# Patient Record
Sex: Female | Born: 1945 | Race: White | Hispanic: No | Marital: Married | Smoking: Current every day smoker
Health system: Southern US, Community
[De-identification: ages and names within clinical notes are randomized; demographics above are authoritative.]

## PROBLEM LIST (undated history)

## (undated) DIAGNOSIS — D509 Iron deficiency anemia, unspecified: Secondary | ICD-10-CM

## (undated) DIAGNOSIS — N183 Chronic kidney disease, stage 3 (moderate): Secondary | ICD-10-CM

## (undated) DIAGNOSIS — M47817 Spondylosis without myelopathy or radiculopathy, lumbosacral region: Secondary | ICD-10-CM

## (undated) DIAGNOSIS — R55 Syncope and collapse: Principal | ICD-10-CM

## (undated) DIAGNOSIS — Z8489 Family history of other specified conditions: Secondary | ICD-10-CM

## (undated) DIAGNOSIS — IMO0002 Reserved for concepts with insufficient information to code with codable children: Secondary | ICD-10-CM

## (undated) DIAGNOSIS — I10 Essential (primary) hypertension: Secondary | ICD-10-CM

## (undated) DIAGNOSIS — D539 Nutritional anemia, unspecified: Secondary | ICD-10-CM

## (undated) DIAGNOSIS — K740 Hepatic fibrosis: Secondary | ICD-10-CM

## (undated) DIAGNOSIS — R109 Unspecified abdominal pain: Secondary | ICD-10-CM

## (undated) DIAGNOSIS — M1990 Unspecified osteoarthritis, unspecified site: Secondary | ICD-10-CM

## (undated) DIAGNOSIS — M329 Systemic lupus erythematosus, unspecified: Secondary | ICD-10-CM

## (undated) DIAGNOSIS — L930 Discoid lupus erythematosus: Principal | ICD-10-CM

## (undated) DIAGNOSIS — M419 Scoliosis, unspecified: Secondary | ICD-10-CM

## (undated) DIAGNOSIS — M519 Unspecified thoracic, thoracolumbar and lumbosacral intervertebral disc disorder: Secondary | ICD-10-CM

## (undated) HISTORY — PX: CATARACT EXTRACTION: SUR2

## (undated) HISTORY — DX: Essential (primary) hypertension: I10

## (undated) HISTORY — DX: Unspecified abdominal pain: R10.9

## (undated) HISTORY — DX: Spondylosis without myelopathy or radiculopathy, lumbosacral region: M47.817

## (undated) HISTORY — DX: Reserved for concepts with insufficient information to code with codable children: IMO0002

## (undated) HISTORY — DX: Nutritional anemia, unspecified: D53.9

## (undated) HISTORY — DX: Discoid lupus erythematosus: L93.0

## (undated) HISTORY — DX: Systemic lupus erythematosus, unspecified: M32.9

## (undated) HISTORY — DX: Hepatic fibrosis: K74.0

## (undated) HISTORY — DX: Chronic kidney disease, stage 3 (moderate): N18.3

## (undated) HISTORY — DX: Hemochromatosis, unspecified: E83.119

## (undated) HISTORY — PX: TUBAL LIGATION: SHX77

---

## 2000-08-08 ENCOUNTER — Encounter: Admission: RE | Admit: 2000-08-08 | Discharge: 2000-08-08 | Payer: Self-pay | Admitting: Obstetrics and Gynecology

## 2000-08-08 ENCOUNTER — Encounter: Payer: Self-pay | Admitting: Obstetrics and Gynecology

## 2001-08-10 ENCOUNTER — Encounter: Admission: RE | Admit: 2001-08-10 | Discharge: 2001-08-10 | Payer: Self-pay | Admitting: Obstetrics and Gynecology

## 2001-08-10 ENCOUNTER — Encounter: Payer: Self-pay | Admitting: Obstetrics and Gynecology

## 2002-08-14 ENCOUNTER — Encounter: Admission: RE | Admit: 2002-08-14 | Discharge: 2002-08-14 | Payer: Self-pay | Admitting: Obstetrics and Gynecology

## 2002-08-14 ENCOUNTER — Encounter: Payer: Self-pay | Admitting: Obstetrics and Gynecology

## 2003-08-19 ENCOUNTER — Encounter: Admission: RE | Admit: 2003-08-19 | Discharge: 2003-08-19 | Payer: Self-pay | Admitting: Obstetrics and Gynecology

## 2003-08-19 ENCOUNTER — Encounter: Payer: Self-pay | Admitting: Obstetrics and Gynecology

## 2004-08-19 ENCOUNTER — Encounter: Admission: RE | Admit: 2004-08-19 | Discharge: 2004-08-19 | Payer: Self-pay | Admitting: Obstetrics and Gynecology

## 2004-09-24 ENCOUNTER — Encounter: Admission: RE | Admit: 2004-09-24 | Discharge: 2004-09-24 | Payer: Self-pay | Admitting: Internal Medicine

## 2005-08-25 ENCOUNTER — Encounter: Admission: RE | Admit: 2005-08-25 | Discharge: 2005-08-25 | Payer: Self-pay | Admitting: Internal Medicine

## 2005-09-06 ENCOUNTER — Encounter: Admission: RE | Admit: 2005-09-06 | Discharge: 2005-09-06 | Payer: Self-pay | Admitting: Obstetrics and Gynecology

## 2005-10-05 ENCOUNTER — Encounter: Admission: RE | Admit: 2005-10-05 | Discharge: 2005-10-05 | Payer: Self-pay | Admitting: Internal Medicine

## 2005-10-28 ENCOUNTER — Ambulatory Visit: Payer: Self-pay | Admitting: Internal Medicine

## 2006-09-07 ENCOUNTER — Encounter: Admission: RE | Admit: 2006-09-07 | Discharge: 2006-09-07 | Payer: Self-pay | Admitting: Internal Medicine

## 2007-09-11 ENCOUNTER — Encounter: Admission: RE | Admit: 2007-09-11 | Discharge: 2007-09-11 | Payer: Self-pay | Admitting: Internal Medicine

## 2008-09-11 ENCOUNTER — Encounter: Admission: RE | Admit: 2008-09-11 | Discharge: 2008-09-11 | Payer: Self-pay | Admitting: Internal Medicine

## 2009-09-12 ENCOUNTER — Encounter: Admission: RE | Admit: 2009-09-12 | Discharge: 2009-09-12 | Payer: Self-pay | Admitting: Internal Medicine

## 2010-09-03 ENCOUNTER — Encounter: Payer: Self-pay | Admitting: Gastroenterology

## 2010-09-14 ENCOUNTER — Encounter
Admission: RE | Admit: 2010-09-14 | Discharge: 2010-09-14 | Payer: Self-pay | Source: Home / Self Care | Admitting: Internal Medicine

## 2010-09-24 ENCOUNTER — Encounter (INDEPENDENT_AMBULATORY_CARE_PROVIDER_SITE_OTHER): Payer: Self-pay | Admitting: *Deleted

## 2010-09-24 ENCOUNTER — Ambulatory Visit: Payer: Self-pay | Admitting: Gastroenterology

## 2010-11-04 ENCOUNTER — Ambulatory Visit: Payer: Self-pay | Admitting: Gastroenterology

## 2010-11-04 ENCOUNTER — Ambulatory Visit (HOSPITAL_COMMUNITY)
Admission: RE | Admit: 2010-11-04 | Discharge: 2010-11-04 | Payer: Self-pay | Source: Home / Self Care | Admitting: Gastroenterology

## 2010-11-04 DIAGNOSIS — K5730 Diverticulosis of large intestine without perforation or abscess without bleeding: Secondary | ICD-10-CM | POA: Insufficient documentation

## 2010-12-20 ENCOUNTER — Encounter: Payer: Self-pay | Admitting: Internal Medicine

## 2010-12-29 NOTE — Letter (Signed)
Summary: [facility] Instructions  [facility]  [address] [place], [place] [address]   Phone: [phone]  Fax: [phone]       Catherine Harrington    06/05/1946    MRN: [id number]        Procedure Day Dorna Bloom: Wednesday 11/04/2010     Arrival Time: 10:00 am      Procedure Time: 11:00 am     Location of Procedure:                    _x _   [facility] Center (4th Floor)                        PREPARATION FOR COLONOSCOPY WITH MOVIPREP   Starting 5 days prior to your procedure Wednesday 12/2 do not eat nuts, seeds, popcorn, corn, beans, peas,  salads, or any raw vegetables.  Do not take any fiber supplements (e.g. Metamucil, Citrucel, and Benefiber).  THE DAY BEFORE YOUR PROCEDURE         DATE: Tuesday 12/6  1.  Drink clear liquids the entire day-NO SOLID FOOD  2.  Do not drink anything colored red or purple.  Avoid juices with pulp.  No orange juice.  3.  Drink at least 64 oz. (8 glasses) of fluid/clear liquids during the day to prevent dehydration and help the prep work efficiently.  CLEAR LIQUIDS INCLUDE: Water Jello Ice Popsicles Tea (sugar ok, no milk/cream) Powdered fruit flavored drinks Coffee (sugar ok, no milk/cream) Gatorade Juice: apple, white grape, white cranberry  Lemonade Clear bullion, consomm, broth Carbonated beverages (any kind) Strained chicken noodle soup Hard Candy                             4.  In the morning, mix first dose of MoviPrep solution:    Empty 1 Pouch A and 1 Pouch B into the disposable container    Add lukewarm drinking water to the top line of the container. Mix to dissolve    Refrigerate (mixed solution should be used within 24 hrs)  5.  Begin drinking the prep at 5:00 p.m. The MoviPrep container is divided by 4 marks.   Every 15 minutes drink the solution down to the next mark (approximately 8 oz) until the full liter is complete.   6.  Follow completed prep with 16 oz of clear liquid of your choice  (Nothing red or purple).  Continue to drink clear liquids until bedtime.  7.  Before going to bed, mix second dose of MoviPrep solution:    Empty 1 Pouch A and 1 Pouch B into the disposable container    Add lukewarm drinking water to the top line of the container. Mix to dissolve    Refrigerate  THE DAY OF YOUR PROCEDURE      DATE: Wednesday 12/7  Beginning at 6:00 a.m. (5 hours before procedure):         1. Every 15 minutes, drink the solution down to the next mark (approx 8 oz) until the full liter is complete.  2. Follow completed prep with 16 oz. of clear liquid of your choice.    3. You may drink clear liquids until 9:00 am (2 HOURS BEFORE PROCEDURE).   MEDICATION INSTRUCTIONS  Unless otherwise instructed, you should take regular prescription medications with a small sip of water   as early as possible the morning of your  procedure.   Additional medication instructions: Stop taking Iron 5 days before procedure.         OTHER INSTRUCTIONS  You will need a responsible adult at least 66 years of age to accompany you and drive you home.   This person must remain in the waiting room during your procedure.  Wear loose fitting clothing that is easily removed.  Leave jewelry and other valuables at home.  However, you may wish to bring a book to read or  an iPod/MP3 player to listen to music as you wait for your procedure to start.  Remove all body piercing jewelry and leave at home.  Total time from sign-in until discharge is approximately 2-3 hours.  You should go home directly after your procedure and rest.  You can resume normal activities the  day after your procedure.  The day of your procedure you should not:   Drive   Make legal decisions   Operate machinery   Drink alcohol   Return to work  You will receive specific instructions about eating, activities and medications before you leave.    The above instructions have been reviewed and explained  to me by   Ezra Sites RN  September 24, 2010 4:05 PM     I fully understand and can verbalize these instructions _____________________________ Date _________

## 2010-12-29 NOTE — Letter (Signed)
Summary: Pre Visit Letter Revised  [facility]  [address] [place], [place] [address]   Phone: [phone]  Fax: [phone]        09/03/2010 MRN: [id number] [facility] [address] [place], [place]  [address]             Procedure Date: 11-10 at 1:30pm  Welcome to the Gastroenterology Division at [facility].    You are scheduled to see a nurse for your pre-procedure visit on 09-24-10 at 3:30pm on the 3rd floor at [facility], [address]  We ask that you try to arrive at our office 15 minutes prior to your appointment time to allow for check-in.  Please take a minute to review the attached form.  If you answer "Yes" to one or more of the questions on the first page, we ask that you call the person listed at your earliest opportunity.  If you answer "No" to all of the questions, please complete the rest of the form and bring it to your appointment.    Your nurse visit will consist of discussing your medical and surgical history, your immediate family medical history, and your medications.   If you are unable to list all of your medications on the form, please bring the medication bottles to your appointment and we will list them.  We will need to be aware of both prescribed and over the counter drugs.  We will need to know exact dosage information as well.    Please be prepared to read and sign documents such as consent forms, a financial agreement, and acknowledgement forms.  If necessary, and with your consent, a friend or relative is welcome to sit-in on the nurse visit with you.  Please bring your insurance card so that we may make a copy of it.  If your insurance requires a referral to see a specialist, please bring your referral form from your primary care physician.  No co-pay is required for this nurse visit.     If you cannot keep your appointment, please call [phone] to cancel or reschedule prior to your appointment date.  This  allows [place] the opportunity to schedule an appointment for another patient in need of care.    Thank you for choosing [facility] for your medical needs.  We appreciate the opportunity to care for you.  Please visit [place] at our website  to learn more about our practice.  Sincerely, The Gastroenterology Division

## 2010-12-29 NOTE — Miscellaneous (Signed)
Summary: [facility]  Clinical Lists Changes  Medications: Added new medication of MOVIPREP 100 GM  SOLR (PEG-KCL-NACL-NASULF-NA ASC-C) As per prep instructions. - Signed Rx of MOVIPREP 100 GM  SOLR (PEG-KCL-NACL-NASULF-NA ASC-C) As per prep instructions.;  #1 x 0;  Signed;  Entered by: Ezra Sites RN;  Authorized by: Louis Meckel MD;  Method used: [facility] [address] [phone]*, [address] [place], [place], [place]  [address], Ph: [phone] or [phone], Fax: [phone] Allergies: Added new allergy or adverse reaction of SULFA Observations: Added new observation of NKA: F (09/24/2010 15:32)    Prescriptions: MOVIPREP 100 GM  SOLR (PEG-KCL-NACL-NASULF-NA ASC-C) As per prep instructions.  #1 x 0   Entered by:   Ezra Sites RN   Authorized by:   Louis Meckel MD   Signed by:   Ezra Sites RN on 09/24/2010   Method used:   [facility] [address] [phone]* (retail)       [address]       [place], [place]  [address]       Ph: [phone] or [phone]       Fax: [phone]   RxID:   [id number]

## 2010-12-29 NOTE — Procedures (Signed)
Summary: Colonoscopy  Patient: [name] Note: All result statuses are Final unless otherwise noted.  Tests: (1) Colonoscopy (COL)   COL Colonoscopy           DONE     [facility]     [address]     [place], [place]  [address]           COLONOSCOPY PROCEDURE REPORT           PATIENT:  Catherine Harrington, Catherine Harrington  MR#:  [id number]     BIRTHDATE:  06-10-1946, 64 yrs. old  GENDER:  female     ENDOSCOPIST:  Barbette Hair. Arlyce Dice, MD     REF. BY:  Nila Nephew, M.D.     PROCEDURE DATE:  11/04/2010     PROCEDURE:  Incomplete colonoscopy     ASA CLASS:  Class II     INDICATIONS:  Routine Risk Screening     MEDICATIONS:   Fentanyl 100 mcg IV, Versed 9 mg IV, Benadryl 37.5     mg IV, glucagon 1 mg IV           DESCRIPTION OF PROCEDURE:   After the risks benefits and     alternatives of the procedure were thoroughly explained, informed     consent was obtained.  Digital rectal exam was performed and     revealed no abnormalities.   The LB 180AL [id number] endoscope was     introduced through the anus and advanced to the sigmoid colon,     limited by extreme patient discomfort, severe spasm; unable to     pass scope, stenosis.  Unable to pass scope beyond sigmoid due to     severe spasm and pt discomfort  The quality of the prep was .  The     instrument was then slowly withdrawn as the colon was fully     examined.     <<PROCEDUREIMAGES>>           FINDINGS:  Severe diverticulosis was found.  This was otherwise a     normal examination of the colon (see image1 and image2).     Retroflexed views in the rectum revealed no abnormalities.    The     scope was then withdrawn from the patient and the procedure     completed.           COMPLICATIONS:  None     ENDOSCOPIC IMPRESSION:     1) Severe diverticulosis     2) Incomplete exam     RECOMMENDATIONS:     1) My office will arrange to you to have a barium enema     performed. This is a radiology test to further examine your colon.       2) Sedation with MAC for future procedures     REPEAT EXAM:  In 10 year(s) for Colonoscopy.           ______________________________     Barbette Hair. Arlyce Dice, MD           CC:           n.     eSIGNED:   Barbette Hair. Atticus Lemberger at 11/04/2010 11:37 AM           Leanor Kail, [id number]  Note: An exclamation mark (!) indicates a result that was not dispersed into the flowsheet. Document Creation Date: 11/04/2010 11:38 AM _______________________________________________________________________  (1) Order result status: Final Collection or observation date-time: 11/04/2010 11:30 Requested date-time:  Receipt date-time:  Reported date-time:  Referring Physician:   Ordering Physician: Melvia Heaps [phone]) Specimen Source:  Source: Launa Grill Order Number: [id number] Lab site:   Appended Document: Orders Update    Clinical Lists Changes  Problems: Added new problem of DIVERTICULOSIS, COLON (ICD-562.10) Orders: Added new Test order of Barium Enema (BE) - Signed      Appended Document: Colonoscopy    Clinical Lists Changes  Observations: Added new observation of COLONNXTDUE: 10/2020 (11/04/2010 13:46)

## 2011-08-11 ENCOUNTER — Other Ambulatory Visit: Payer: Self-pay | Admitting: Internal Medicine

## 2011-08-11 DIAGNOSIS — Z1231 Encounter for screening mammogram for malignant neoplasm of breast: Secondary | ICD-10-CM

## 2011-09-15 ENCOUNTER — Encounter: Payer: Self-pay | Admitting: Gynecology

## 2011-09-15 ENCOUNTER — Ambulatory Visit (INDEPENDENT_AMBULATORY_CARE_PROVIDER_SITE_OTHER): Payer: Medicare Other | Admitting: Gynecology

## 2011-09-15 VITALS — BP 140/80 | Ht 60.25 in | Wt 109.0 lb

## 2011-09-15 DIAGNOSIS — N952 Postmenopausal atrophic vaginitis: Secondary | ICD-10-CM

## 2011-09-15 DIAGNOSIS — R82998 Other abnormal findings in urine: Secondary | ICD-10-CM

## 2011-09-15 DIAGNOSIS — Z79890 Hormone replacement therapy (postmenopausal): Secondary | ICD-10-CM

## 2011-09-15 DIAGNOSIS — Z780 Asymptomatic menopausal state: Secondary | ICD-10-CM

## 2011-09-15 DIAGNOSIS — Z13820 Encounter for screening for osteoporosis: Secondary | ICD-10-CM

## 2011-09-15 MED ORDER — MEDROXYPROGESTERONE ACETATE 2.5 MG PO TABS: 2.5000 mg | ORAL_TABLET | Freq: Every day | ORAL | Status: DC

## 2011-09-15 MED ORDER — ESTRADIOL 0.5 MG PO TABS: 0.5000 mg | ORAL_TABLET | Freq: Every day | ORAL | Status: DC

## 2011-09-15 NOTE — Progress Notes (Signed)
Addended byCammie Mcgee T on: 09/15/2011 04:10 PM   Modules accepted: Orders

## 2011-09-15 NOTE — Progress Notes (Signed)
Catherine Harrington Apr 10, 1946 [id number]        65 y.o.  Follow up.  Former patient of Dr. Angeline Slim. She's doing well from a gynecologic standpoint. She is on HRT of estradiol 1 mg Provera 5 mg daily has been on this since age 22 when she went through early menopause.  Past medical history,surgical history, medications, allergies, family history and social history were all reviewed and documented in the EPIC chart. ROS:  Was performed and pertinent positives and negatives are included in the history.  Exam: chaperone present Filed Vitals:   09/15/11 1403  BP: 140/80   General appearance  Normal Skin with erythematous patchy rash over shoulders neck upper arms. Small crusting lesion left shoulder. Head/Neck normal with no cervical or supraclavicular adenopathy thyroid normal Lungs  clear Cardiac RR, without RMG Abdominal  soft, nontender, without masses, organomegaly or hernia Breasts  examined lying and sitting without masses, retractions, discharge or axillary adenopathy. Pelvic  Ext/BUS/vagina  normal with atrophic genital changes noted  Cervix  normal  Pap not done  Uterus  anteverted, normal size, shape and contour, midline and mobile nontender   Adnexa  Without masses or tenderness    Anus and perineum  normal   Rectovaginal  normal sphincter tone without palpated masses or tenderness.    Assessment/Plan:  65 y.o. female for annual exam.    1. HRT. I reviewed the issues of HRT. The WHI study increased risk of stroke heart attack DVT increased risk of breast cancer as well as the ACOG and NAMS recommendation for lowest dose for the shortest period of time. My recommendation would be to try to wean from HRT at this point. She will finish her current prescription and I prescribed a 30 day supply of estradiol 0.5 mg and Provera 2.5 mg she'll take daily for 2 weeks and then every other to every third day for 2 weeks and then stop. She does well off of this she will follow if she has any  issues, recurrences of hot flushes night sweats or any other symptoms she should call me and we'll rediscuss HRT. 2. Cigarette smoking. I discussed the need to stop smoking and she acknowledges is my recommendation and requests no help at this time. 3. Bone density. She reports having a bone density a number of years ago but no recent study. She has multiple risk factors.  I ordered a bone density now,  She had blood work done through her primary office and I asked her to call to see if they did a vitamin D and if not then to have one done the next time they draw blood. 4. Skin lesions. Patient has a generalized erythematous patchy rash over her shoulders neck upper arms. Saw Dr. Danella Deis, is unhappy with recommendations. She has isolated scaly lesion on her upper left shoulder present for several weeks and I discussed with her the possibility of skin cancer and I recommended she follow up with a dermatologist to examine her and possibly excise or biopsy. I gave him the name of other dermatologists in town and she agrees to call make an appointment to see them and the importance of follow up. 5. Health maintenance. Self breast exams on a monthly basis discussed encouraged. She has mammogram scheduled next week we'll follow up for this. She had colposcopy a year ago historically and will follow up with their recommendations.    Dara Lords MD, 3:37 PM 09/15/2011

## 2011-09-20 ENCOUNTER — Ambulatory Visit: Payer: Self-pay

## 2011-09-23 ENCOUNTER — Ambulatory Visit
Admission: RE | Admit: 2011-09-23 | Discharge: 2011-09-23 | Disposition: A | Discharge status: Home / Self Care | Payer: BLUE CROSS/BLUE SHIELD | Source: Ambulatory Visit | Attending: Internal Medicine | Admitting: Internal Medicine

## 2011-09-23 DIAGNOSIS — Z1231 Encounter for screening mammogram for malignant neoplasm of breast: Secondary | ICD-10-CM

## 2011-10-19 ENCOUNTER — Ambulatory Visit (INDEPENDENT_AMBULATORY_CARE_PROVIDER_SITE_OTHER): Payer: Medicare Other

## 2011-10-19 DIAGNOSIS — Z13820 Encounter for screening for osteoporosis: Secondary | ICD-10-CM

## 2011-11-04 ENCOUNTER — Telehealth: Payer: Self-pay | Admitting: *Deleted

## 2011-11-04 NOTE — Telephone Encounter (Signed)
Pt had question about her bone density report copy that was mailed regarding a comment that TF wrote. All questions answered.

## 2011-11-15 ENCOUNTER — Telehealth: Payer: Self-pay | Admitting: Gynecology

## 2011-11-15 NOTE — Telephone Encounter (Signed)
Spoke with pt regarding the below note, pt wanted the name of other orthopedic office gave p [facility], she will have records sent. Pt complained she didn't like her previous doctor.

## 2011-11-15 NOTE — Telephone Encounter (Signed)
Lm for pt to call

## 2011-11-15 NOTE — Telephone Encounter (Signed)
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 I would recommend that she follow up with an orthopedic surgeon for evaluation and possible further studies such as diagnostic x-rays or even MRI.

## 2011-11-30 HISTORY — PX: MOUTH SURGERY: SHX715

## 2011-12-15 ENCOUNTER — Encounter: Payer: Self-pay | Admitting: *Deleted

## 2011-12-15 NOTE — Progress Notes (Signed)
Patient ID: Catherine Harrington, female   DOB: [birth year], 66 y.o.   MRN: [id number] Pt wanted to know if she TF wanted her to continue provera 2.5 mg. I told pt per last office visit he wanted her to wean off medication and if no problems than no medicine was needed.

## 2012-05-10 ENCOUNTER — Other Ambulatory Visit: Payer: Self-pay | Admitting: Gynecology

## 2012-05-10 ENCOUNTER — Ambulatory Visit (INDEPENDENT_AMBULATORY_CARE_PROVIDER_SITE_OTHER): Payer: Medicare Other | Admitting: Gynecology

## 2012-05-10 ENCOUNTER — Encounter: Payer: Self-pay | Admitting: Gynecology

## 2012-05-10 DIAGNOSIS — N390 Urinary tract infection, site not specified: Secondary | ICD-10-CM

## 2012-05-10 DIAGNOSIS — R350 Frequency of micturition: Secondary | ICD-10-CM

## 2012-05-10 DIAGNOSIS — IMO0001 Reserved for inherently not codable concepts without codable children: Secondary | ICD-10-CM

## 2012-05-10 LAB — URINALYSIS W MICROSCOPIC + REFLEX CULTURE
Casts: NONE SEEN
Crystals: NONE SEEN
Ketones, ur: NEGATIVE mg/dL
Nitrite: POSITIVE — AB
Specific Gravity, Urine: 1.01 (ref 1.005–1.030)
pH: 5.5 (ref 5.0–8.0)

## 2012-05-10 MED ORDER — PHENAZOPYRIDINE HCL 200 MG PO TABS: 200.0000 mg | ORAL_TABLET | Freq: Three times a day (TID) | ORAL | Status: AC | PRN

## 2012-05-10 MED ORDER — CIPROFLOXACIN HCL 500 MG PO TABS: 500.0000 mg | ORAL_TABLET | Freq: Two times a day (BID) | ORAL | Status: AC

## 2012-05-10 NOTE — Patient Instructions (Signed)
Urinary Tract Infection Infections of the urinary tract can start in several places. A bladder infection (cystitis), a kidney infection (pyelonephritis), and a prostate infection (prostatitis) are different types of urinary tract infections (UTIs). They usually get better if treated with medicines (antibiotics) that kill germs. Take all the medicine until it is gone. You or your child may feel better in a few days, but TAKE ALL MEDICINE or the infection may not respond and may become more difficult to treat. HOME CARE INSTRUCTIONS   Drink enough water and fluids to keep the urine clear or pale yellow. Cranberry juice is especially recommended, in addition to large amounts of water.   Avoid caffeine, tea, and carbonated beverages. They tend to irritate the bladder.   Alcohol may irritate the prostate.   Only take over-the-counter or prescription medicines for pain, discomfort, or fever as directed by your caregiver.  To prevent further infections:  Empty the bladder often. Avoid holding urine for long periods of time.   After a bowel movement, women should cleanse from front to back. Use each tissue only once.   Empty the bladder before and after sexual intercourse.  FINDING OUT THE RESULTS OF YOUR TEST Not all test results are available during your visit. If your or your child's test results are not back during the visit, make an appointment with your caregiver to find out the results. Do not assume everything is normal if you have not heard from your caregiver or the medical facility. It is important for you to follow up on all test results. SEEK MEDICAL CARE IF:   There is back pain.   Your baby is older than 3 months with a rectal temperature of 100.5 F (38.1 C) or higher for more than 1 day.   Your or your child's problems (symptoms) are no better in 3 days. Return sooner if you or your child is getting worse.  SEEK IMMEDIATE MEDICAL CARE IF:   There is severe back pain or lower  abdominal pain.   You or your child develops chills.   You have a fever.   Your baby is older than 3 months with a rectal temperature of 102 F (38.9 C) or higher.   Your baby is 78 months old or younger with a rectal temperature of 100.4 F (38 C) or higher.   There is nausea or vomiting.   There is continued burning or discomfort with urination.  MAKE SURE YOU:   Understand these instructions.   Will watch your condition.   Will get help right away if you are not doing well or get worse.  Document Released: 08/25/2005 Document Revised: 11/04/2011 Document Reviewed: 03/30/2007 [facility] Patient Information 2012 [place], [place].Take antibiotics as prescribed.

## 2012-05-10 NOTE — Progress Notes (Signed)
Patient presents with two-day history of cloudy urine suprapubic pressure symptoms and mild low back pain. No fever chills nausea vomiting diarrhea constipation. Is having more hot flashes since she stopped her HRT but is tolerable to her.  Exam Spine straight without CVA tenderness. Abdomen soft mild suprapubic tenderness no masses guarding rebound organomegaly.  Assessment and plan: Symptoms and UA consistent with UTI. We'll treat with ciprofloxacin 500 twice a day x3 days and Pyridium 200 mg 3 times a day x3 days. Follow up if symptoms persist or recur. Follow up if she wants to rediscuss HRT.

## 2012-08-15 ENCOUNTER — Other Ambulatory Visit: Payer: Self-pay | Admitting: Internal Medicine

## 2012-08-15 DIAGNOSIS — Z1231 Encounter for screening mammogram for malignant neoplasm of breast: Secondary | ICD-10-CM

## 2012-09-15 ENCOUNTER — Encounter: Payer: Self-pay | Admitting: Gynecology

## 2012-09-15 ENCOUNTER — Ambulatory Visit (INDEPENDENT_AMBULATORY_CARE_PROVIDER_SITE_OTHER): Payer: Medicare Other | Admitting: Gynecology

## 2012-09-15 VITALS — BP 126/78 | Ht 61.0 in | Wt 109.0 lb

## 2012-09-15 DIAGNOSIS — N951 Menopausal and female climacteric states: Secondary | ICD-10-CM

## 2012-09-15 DIAGNOSIS — N952 Postmenopausal atrophic vaginitis: Secondary | ICD-10-CM

## 2012-09-15 NOTE — Progress Notes (Signed)
Catherine Harrington 06/04/1946 [id number]        66 y.o.  [id number] for follow up exam.    Past medical history,surgical history, medications, allergies, family history and social history were all reviewed and documented in the EPIC chart. ROS:  Was performed and pertinent positives and negatives are included in the history.  Exam: Kim assistant Filed Vitals:   09/15/12 1406  BP: 126/78  Height: 5\' 1"  (1.549 m)  Weight: 109 lb (49.442 kg)   General appearance  Normal Skin mottled appearance upper chest and back consistent with old skin disease. Head/Neck normal with no cervical or supraclavicular adenopathy thyroid normal Lungs  clear Cardiac RR, without RMG Abdominal  soft, nontender, without masses, organomegaly or hernia Breasts  examined lying and sitting without masses, retractions, discharge or axillary adenopathy. Pelvic  Ext/BUS/vagina  normal with mild atrophic changes  Cervix  normal with atrophic changes  Uterus  axial, normal size, shape and contour, midline and mobile nontender   Adnexa  Without masses or tenderness    Anus and perineum  normal   Rectovaginal  normal sphincter tone without palpated masses or tenderness.    Assessment/Plan:  66 y.o. [id number] female for follow up exam.   1. Menopausal symptoms. Patient continues with hot flashes/night sweats. She discontinued HRT last year after our discussion.  Options for management include observation, OTC soy based, pharmacologic nonhormonal such as Effexor and HRT reviewed. She wants to try soy-based first and will follow up if she wants to discuss other alternatives. 2. Atrophic vaginal changes. Patient asymptomatic. We'll plan on following. 3. History of skin rash. Actively being followed by dermatology. 4. Mammography. Patient is due now she knows to schedule this. SBE monthly reviewed. 5. DEXA 2012 normal. Plan repeat at 5 year interval.  Increase calcium vitamin D reviewed. 6. Pap smear. No Pap smear done today. No  history of abnormal Pap smears before. We've previously discussed and she is over the age of 19 and we will plan stop screening. 7. Colonoscopy.  Patient had 2011. We'll follow up with their recommended interval. 8. Health maintenance. No blood work done today as this all done through her primary physician's office. Follow up one year, sooner as needed.   Dara Lords MD, 3:19 PM 09/15/2012

## 2012-09-15 NOTE — Patient Instructions (Signed)
Follow up in one year for annual exam 

## 2012-09-25 ENCOUNTER — Ambulatory Visit
Admission: RE | Admit: 2012-09-25 | Discharge: 2012-09-25 | Disposition: A | Discharge status: Home / Self Care | Payer: Medicare Other | Source: Ambulatory Visit | Attending: Internal Medicine | Admitting: Internal Medicine

## 2012-09-25 DIAGNOSIS — Z1231 Encounter for screening mammogram for malignant neoplasm of breast: Secondary | ICD-10-CM

## 2013-01-13 ENCOUNTER — Other Ambulatory Visit: Payer: Self-pay

## 2013-07-04 ENCOUNTER — Other Ambulatory Visit: Payer: Self-pay

## 2013-08-16 ENCOUNTER — Encounter (HOSPITAL_COMMUNITY): Payer: Self-pay | Admitting: General Practice

## 2013-08-16 ENCOUNTER — Observation Stay (HOSPITAL_COMMUNITY)
Admission: EM | Admit: 2013-08-16 | Discharge: 2013-08-17 | Disposition: A | Discharge status: Home / Self Care | Payer: Medicare Other | Attending: Internal Medicine | Admitting: Internal Medicine

## 2013-08-16 ENCOUNTER — Emergency Department (HOSPITAL_COMMUNITY): Payer: Medicare Other

## 2013-08-16 ENCOUNTER — Observation Stay (HOSPITAL_COMMUNITY): Payer: Medicare Other

## 2013-08-16 DIAGNOSIS — I959 Hypotension, unspecified: Secondary | ICD-10-CM | POA: Insufficient documentation

## 2013-08-16 DIAGNOSIS — K5730 Diverticulosis of large intestine without perforation or abscess without bleeding: Secondary | ICD-10-CM

## 2013-08-16 DIAGNOSIS — I079 Rheumatic tricuspid valve disease, unspecified: Secondary | ICD-10-CM | POA: Insufficient documentation

## 2013-08-16 DIAGNOSIS — R55 Syncope and collapse: Principal | ICD-10-CM

## 2013-08-16 DIAGNOSIS — I059 Rheumatic mitral valve disease, unspecified: Secondary | ICD-10-CM | POA: Insufficient documentation

## 2013-08-16 DIAGNOSIS — D7589 Other specified diseases of blood and blood-forming organs: Secondary | ICD-10-CM

## 2013-08-16 DIAGNOSIS — R780 Finding of alcohol in blood: Secondary | ICD-10-CM | POA: Insufficient documentation

## 2013-08-16 DIAGNOSIS — I6529 Occlusion and stenosis of unspecified carotid artery: Secondary | ICD-10-CM | POA: Insufficient documentation

## 2013-08-16 HISTORY — DX: Iron deficiency anemia, unspecified: D50.9

## 2013-08-16 HISTORY — DX: Syncope and collapse: R55

## 2013-08-16 HISTORY — DX: Unspecified osteoarthritis, unspecified site: M19.90

## 2013-08-16 HISTORY — DX: Unspecified thoracic, thoracolumbar and lumbosacral intervertebral disc disorder: M51.9

## 2013-08-16 HISTORY — DX: Scoliosis, unspecified: M41.9

## 2013-08-16 HISTORY — DX: Family history of other specified conditions: Z84.89

## 2013-08-16 LAB — CBC WITH DIFFERENTIAL/PLATELET
Basophils Absolute: 0 10*3/uL (ref 0.0–0.1)
Basophils Relative: 0 % (ref 0–1)
Eosinophils Absolute: 0 10*3/uL (ref 0.0–0.7)
Eosinophils Relative: 1 % (ref 0–5)
Lymphs Abs: 1.3 10*3/uL (ref 0.7–4.0)
MCH: 36.5 pg — ABNORMAL HIGH (ref 26.0–34.0)
MCHC: 35.9 g/dL (ref 30.0–36.0)
MCV: 101.7 fL — ABNORMAL HIGH (ref 78.0–100.0)
Platelets: 191 10*3/uL (ref 150–400)
RDW: 12.4 % (ref 11.5–15.5)

## 2013-08-16 LAB — ETHANOL: Alcohol, Ethyl (B): 52 mg/dL — ABNORMAL HIGH (ref 0–11)

## 2013-08-16 LAB — CBC
HCT: 34 % — ABNORMAL LOW (ref 36.0–46.0)
Hemoglobin: 12 g/dL (ref 12.0–15.0)
MCH: 36.3 pg — ABNORMAL HIGH (ref 26.0–34.0)
MCV: 102.7 fL — ABNORMAL HIGH (ref 78.0–100.0)
RBC: 3.31 MIL/uL — ABNORMAL LOW (ref 3.87–5.11)

## 2013-08-16 LAB — URINALYSIS, ROUTINE W REFLEX MICROSCOPIC
Bilirubin Urine: NEGATIVE
Leukocytes, UA: NEGATIVE
Nitrite: NEGATIVE
Specific Gravity, Urine: 1.003 — ABNORMAL LOW (ref 1.005–1.030)
pH: 5.5 (ref 5.0–8.0)

## 2013-08-16 LAB — POCT I-STAT TROPONIN I: Troponin i, poc: 0 ng/mL (ref 0.00–0.08)

## 2013-08-16 LAB — COMPREHENSIVE METABOLIC PANEL
ALT: 67 U/L — ABNORMAL HIGH (ref 0–35)
Calcium: 10 mg/dL (ref 8.4–10.5)
GFR calc Af Amer: 71 mL/min — ABNORMAL LOW (ref >90)
Glucose, Bld: 97 mg/dL (ref 70–99)
Sodium: 134 mEq/L — ABNORMAL LOW (ref 135–145)
Total Protein: 7.2 g/dL (ref 6.0–8.3)

## 2013-08-16 LAB — TSH: TSH: 3.16 u[IU]/mL (ref 0.350–4.500)

## 2013-08-16 LAB — VITAMIN B12: Vitamin B-12: 770 pg/mL (ref 211–911)

## 2013-08-16 MED ORDER — LOSARTAN POTASSIUM 50 MG PO TABS: 100.0000 mg | ORAL_TABLET | Freq: Every day | ORAL | Status: DC

## 2013-08-16 MED ORDER — ACETAMINOPHEN 325 MG PO TABS: 650.0000 mg | ORAL_TABLET | Freq: Four times a day (QID) | ORAL | Status: DC | PRN

## 2013-08-16 MED ORDER — LOSARTAN POTASSIUM 50 MG PO TABS
100.0000 mg | ORAL_TABLET | Freq: Every day | ORAL | Status: DC
  Filled 2013-08-16: qty 2

## 2013-08-16 MED ORDER — FERROUS SULFATE 325 (65 FE) MG PO TABS
325.0000 mg | ORAL_TABLET | Freq: Every day | ORAL | Status: DC
  Administered 2013-08-17: 325 mg via ORAL
  Filled 2013-08-16 (×2): qty 1

## 2013-08-16 MED ORDER — SODIUM CHLORIDE 0.9 % IV BOLUS (SEPSIS)
1000.0000 mL | Freq: Once | INTRAVENOUS | Status: AC
  Administered 2013-08-16: 1000 mL via INTRAVENOUS

## 2013-08-16 MED ORDER — SODIUM CHLORIDE 0.9 % IJ SOLN: 3.0000 mL | Freq: Two times a day (BID) | INTRAMUSCULAR | Status: DC

## 2013-08-16 MED ORDER — HEPARIN SODIUM (PORCINE) 5000 UNIT/ML IJ SOLN
5000.0000 [IU] | Freq: Three times a day (TID) | INTRAMUSCULAR | Status: DC
  Filled 2013-08-16 (×6): qty 1

## 2013-08-16 MED ORDER — LOSARTAN POTASSIUM 50 MG PO TABS
100.0000 mg | ORAL_TABLET | Freq: Every day | ORAL | Status: DC
  Administered 2013-08-16: 100 mg via ORAL
  Filled 2013-08-16 (×2): qty 2

## 2013-08-16 MED ORDER — ONDANSETRON HCL 4 MG/2ML IJ SOLN: 4.0000 mg | Freq: Four times a day (QID) | INTRAMUSCULAR | Status: DC | PRN

## 2013-08-16 MED ORDER — ACETAMINOPHEN 650 MG RE SUPP: 650.0000 mg | Freq: Four times a day (QID) | RECTAL | Status: DC | PRN

## 2013-08-16 MED ORDER — CALCIUM CARBONATE 600 MG PO TABS: 600.0000 mg | ORAL_TABLET | Freq: Two times a day (BID) | ORAL | Status: DC

## 2013-08-16 MED ORDER — ONDANSETRON HCL 4 MG PO TABS: 4.0000 mg | ORAL_TABLET | Freq: Four times a day (QID) | ORAL | Status: DC | PRN

## 2013-08-16 MED ORDER — THIAMINE HCL 100 MG/ML IJ SOLN
Freq: Once | INTRAVENOUS | Status: AC
  Administered 2013-08-16: 20:00:00 via INTRAVENOUS
  Filled 2013-08-16: qty 1000

## 2013-08-16 MED ORDER — DOXAZOSIN MESYLATE 8 MG PO TABS
8.0000 mg | ORAL_TABLET | Freq: Every day | ORAL | Status: DC
  Administered 2013-08-16: 8 mg via ORAL
  Filled 2013-08-16 (×2): qty 1

## 2013-08-16 MED ORDER — CALCIUM CARBONATE 1250 (500 CA) MG PO TABS
1.0000 | ORAL_TABLET | Freq: Two times a day (BID) | ORAL | Status: DC
  Administered 2013-08-16 – 2013-08-17 (×2): 500 mg via ORAL
  Filled 2013-08-16 (×4): qty 1

## 2013-08-16 MED ORDER — MORPHINE SULFATE 2 MG/ML IJ SOLN: 2.0000 mg | INTRAMUSCULAR | Status: DC | PRN

## 2013-08-16 NOTE — ED Notes (Signed)
Pt. States "I feel fine now, I'm ready to go home".

## 2013-08-16 NOTE — ED Provider Notes (Signed)
CSN: [id number]     Arrival date & time 08/16/13  0507 History   First MD Initiated Contact with Patient 08/16/13 [phone]     Chief Complaint  Patient presents with  . 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   She is BIB EMS after a syncopal event. The patient had gotten up in the middle of the night to urinate and, as she was leaving the bathroom, her husband heard her fall. He came to her side and she seemed slightly dazed but was alert and completely oriented within about 2-3 seconds. She was not incontinent. Denies any preceding lightheadedness, cp or sob. NO recent med changes. PO intake has been normal.   Patient had a single similar episode of post micturition syncope about 10 years ago. She was worked up in the office of her PCP.   Past Medical History  Diagnosis Date  . Hypertension   . Lupus    Past Surgical History  Procedure Laterality Date  . Cesarean section      x2  . Mouth surgery    . Tubal ligation     Family History  Problem Relation Age of Onset  . Hypertension Mother   . Hypertension Father    History  Substance Use Topics  . Smoking status: Current Every Day Smoker -- 1.00 packs/day    Types: Cigarettes  . Smokeless tobacco: Never Used  . Alcohol Use: 7.5 oz/week    15 drink(s) per week     Comment: 2-3 beers at night    OB History   Grav Para Term Preterm Abortions TAB SAB Ect Mult Living   4 4 2       2      Review of Systems 10 point ROS negative except for sx noted above.  Allergies  Midol and Sulfonamide derivatives  Home Medications   Current Outpatient Rx  Name  Route  Sig  Dispense  Refill  . calcium carbonate (OS-CAL) 600 MG TABS   Oral   Take 600 mg by mouth 2 (two) times daily with a meal.           . doxazosin (CARDURA) 8 MG  tablet   Oral   Take 8 mg by mouth at bedtime.           . ferrous sulfate 325 (65 FE) MG tablet   Oral   Take 325 mg by mouth daily with breakfast.           . fexofenadine (ALLEGRA) 180 MG tablet   Oral   Take 180 mg by mouth daily.           . fish oil-omega-3 fatty acids 1000 MG capsule   Oral   Take 2 g by mouth daily.           . Glucosamine HCl (GLUCOSAMINE PO)   Oral   Take 1 tablet by mouth daily.         Marland Kitchen losartan (COZAAR) 100 MG tablet   Oral   Take 100 mg by mouth daily.           . Multiple Vitamin (MULTIVITAMIN) tablet   Oral   Take 1 tablet by mouth daily.           Marland Kitchen OVER THE COUNTER MEDICATION   Oral   Take 1 tablet by mouth daily.  Tumeric          BP 111/56  Pulse 76  Temp(Src) 98 F (36.7 C) (Oral)  Resp 20  SpO2 98% Physical Exam Gen: well developed and well nourished appearing Head: NCAT, no scalp hematoma or signs of trauma Eyes: PERL, EOMI Nose: no epistaixis or rhinorrhea Mouth/throat: mucosa is mildly dehydrated appearing and pink Neck: supple, no stridor, no c spine ttp Lungs: CTA B, no wheezing, rhonchi or rales CV: RRR, no murmur, ext appear well perfused.  Abd: soft, notender, nondistended Back: no ttp, normal to inspection.  Skin: warm and dry Neuro: CN ii-xii grossly intact, no focal deficits Ext: nontender Psyche; normal affect,  calm and cooperative.   ED Course  Procedures (including critical care time)  Results for orders placed during the hospital encounter of 08/16/13 (from the past 24 hour(s))  GLUCOSE, CAPILLARY     Status: None   Collection Time    08/16/13  6:13 AM      Result Value Range   Glucose-Capillary 90  70 - 99 mg/dL  CBC WITH DIFFERENTIAL     Status: Abnormal   Collection Time    08/16/13  6:20 AM      Result Value Range   WBC 5.1  4.0 - 10.5 K/uL   RBC 3.48 (*) 3.87 - 5.11 MIL/uL   Hemoglobin 12.7  12.0 - 15.0 g/dL   HCT 16.1 (*) 09.6 - 04.5 %   MCV 101.7 (*) 78.0 - 100.0 fL    MCH 36.5 (*) 26.0 - 34.0 pg   MCHC 35.9  30.0 - 36.0 g/dL   RDW 40.9  81.1 - 91.4 %   Platelets 191  150 - 400 K/uL   Neutrophils Relative % 67  43 - 77 %   Neutro Abs 3.4  1.7 - 7.7 K/uL   Lymphocytes Relative 25  12 - 46 %   Lymphs Abs 1.3  0.7 - 4.0 K/uL   Monocytes Relative 7  3 - 12 %   Monocytes Absolute 0.3  0.1 - 1.0 K/uL   Eosinophils Relative 1  0 - 5 %   Eosinophils Absolute 0.0  0.0 - 0.7 K/uL   Basophils Relative 0  0 - 1 %   Basophils Absolute 0.0  0.0 - 0.1 K/uL  COMPREHENSIVE METABOLIC PANEL     Status: Abnormal   Collection Time    08/16/13  6:20 AM      Result Value Range   Sodium 134 (*) 135 - 145 mEq/L   Potassium 4.0  3.5 - 5.1 mEq/L   Chloride 101  96 - 112 mEq/L   CO2 19  19 - 32 mEq/L   Glucose, Bld 97  70 - 99 mg/dL   BUN 12  6 - 23 mg/dL   Creatinine, Ser 7.82  0.50 - 1.10 mg/dL   Calcium 95.6  8.4 - 21.3 mg/dL   Total Protein 7.2  6.0 - 8.3 g/dL   Albumin 3.6  3.5 - 5.2 g/dL   AST 86 (*) 0 - 37 U/L   ALT 67 (*) 0 - 35 U/L   Alkaline Phosphatase 85  39 - 117 U/L   Total Bilirubin 0.3  0.3 - 1.2 mg/dL   GFR calc non Af Amer 61 (*) >90 mL/min   GFR calc Af Amer 71 (*) >90 mL/min   CXR: normal cardiac silloute, normal appearing mediastinum, no infiltrates, no acute process identified.   EKG: nsr, no acute ischemic changes, normal intervals,  normal axis, normal qrs complex MDM   I received additional history from the patient's husband that her BP was 80s/40s at home, per EMS. Nurse who received report indicated in her charting that initial BP was even lower with systolic in the 60s.   Suspect post-micturition syncope. However, it is unusual that the patient's BP would not have normalized by the time that EMS arrived. Even more puzzling is the fact that her orthostatic VS are wnl. No lab, CXR or EKG abnormalities. I believe the patient would benefit from observation admission and cardiac monitoring along with full rule out and consideration of outpatient  heart monitoring.   6213: I have discussed ED findings with the patient and given her my recommendation for admission. We have had an extensive discussion - > 10 minutes.   Brandt Loosen, MD 08/30/13 [phone]

## 2013-08-16 NOTE — ED Notes (Signed)
Per ems-- pt from home, pt got up from bed. Pt husband found pt on ground, pt didn't remember how she got there. Upon ems arrival pt states she doesn't feel bad. First bp 60/20 (automatic). IV L wrist. Last bp 84/56 hr. 60's.

## 2013-08-16 NOTE — Progress Notes (Signed)
Utilization review completed.  

## 2013-08-16 NOTE — ED Notes (Signed)
PT HUSBAND-TONY [phone]

## 2013-08-16 NOTE — ED Notes (Signed)
Pt PCP is Dr Joesphine Bare Wilson.

## 2013-08-16 NOTE — H&P (Addendum)
[facility] History and Physical  Catherine Harrington ZOX:[id number] DOB: 1946/02/28 DOA: 08/16/2013  Referring physician: Emergency Department PCP: Enrique Sack, MD  Specialists:   Chief Complaint: Syncope  HPI: Catherine Harrington is a 67 y.o. female  With a hx of lupus who presented to the ED s/p acute syncope with no pre-syncopal symptoms. Recalled up to the toilet this AM to void when she spontaneously had LOC. Awoke without postictal state. No bowel/bladder incontinence. No CP or SOB. In the ED, pt noted to have elevated ETOH level at 52. Head CT has since been ordered, still pending.  Review of Systems:  Per above, the remainder of 10pt ROS reviewed and are neg  Past Medical History  Diagnosis Date  . Hypertension   . Lupus    Past Surgical History  Procedure Laterality Date  . Cesarean section      x2  . Mouth surgery    . Tubal ligation     Social History:  reports that she has been smoking Cigarettes.  She has been smoking about 1.00 pack per day. She has never used smokeless tobacco. She reports that she drinks about 7.5 ounces of alcohol per week. She reports that she does not use illicit drugs.  where does patient live--home, ALF, SNF? and with whom if at home?  Can patient participate in ADLs?  Allergies  Allergen Reactions  . Midol [Ibuprofen]   . Sulfonamide Derivatives     REACTION: throat swells    Family History  Problem Relation Age of Onset  . Hypertension Mother   . Hypertension Father     (be sure to complete)  Prior to Admission medications   Medication Sig Start Date End Date Taking? Authorizing Provider  calcium carbonate (OS-CAL) 600 MG TABS Take 600 mg by mouth 2 (two) times daily with a meal.     Yes Historical Provider, MD  doxazosin (CARDURA) 8 MG tablet Take 8 mg by mouth at bedtime.     Yes Historical Provider, MD  ferrous sulfate 325 (65 FE) MG tablet Take 325 mg by mouth daily with breakfast.     Yes Historical Provider, MD   fexofenadine (ALLEGRA) 180 MG tablet Take 180 mg by mouth daily.     Yes Historical Provider, MD  fish oil-omega-3 fatty acids 1000 MG capsule Take 2 g by mouth daily.     Yes Historical Provider, MD  Glucosamine HCl (GLUCOSAMINE PO) Take 1 tablet by mouth daily.   Yes Historical Provider, MD  losartan (COZAAR) 100 MG tablet Take 100 mg by mouth daily.     Yes Historical Provider, MD  Multiple Vitamin (MULTIVITAMIN) tablet Take 1 tablet by mouth daily.     Yes Historical Provider, MD  OVER THE COUNTER MEDICATION Take 1 tablet by mouth daily. Tumeric   Yes Historical Provider, MD   Physical Exam: Filed Vitals:   08/16/13 0537 08/16/13 0609 08/16/13 0610 08/16/13 0610  BP: 111/56 105/52 118/58 121/88  Pulse: 76 69 81 82  Temp: 98 F (36.7 C)     TempSrc: Oral     Resp: 20     SpO2: 98%        General:  Awake, in nad  Eyes: PERRL B  ENT: membranes moist, dentition fair  Neck: trachea midline, neck supple  Cardiovascular: regular, s1, s2  Respiratory: normal resp effort, no wheezing  Abdomen: soft, nondistended  Skin: no abnormal skin lesions seen, normal skin turgor  Musculoskeletal: perfused, no clubbing or cyanosis  Psychiatric: mood/affect normal // no auditory/visual hallucinations  Neurologic: cn2-12 grossly intact, strength/sensation intact  Labs on Admission:  Basic Metabolic Panel:  Recent Labs Lab 08/16/13 0620  NA 134*  K 4.0  CL 101  CO2 19  GLUCOSE 97  BUN 12  CREATININE 0.94  CALCIUM 10.0   Liver Function Tests:  Recent Labs Lab 08/16/13 0620  AST 86*  ALT 67*  ALKPHOS 85  BILITOT 0.3  PROT 7.2  ALBUMIN 3.6   No results found for this basename: LIPASE, AMYLASE,  in the last 168 hours No results found for this basename: AMMONIA,  in the last 168 hours CBC:  Recent Labs Lab 08/16/13 0620  WBC 5.1  NEUTROABS 3.4  HGB 12.7  HCT 35.4*  MCV 101.7*  PLT 191   Cardiac Enzymes: No results found for this basename: CKTOTAL, CKMB,  CKMBINDEX, TROPONINI,  in the last 168 hours  BNP (last 3 results) No results found for this basename: PROBNP,  in the last 8760 hours CBG:  Recent Labs Lab 08/16/13 0613  GLUCAP 90    Radiological Exams on Admission: Dg Chest 2 View  08/16/2013   *RADIOLOGY REPORT*  Clinical Data: Loss of consciousness  CHEST - 2 VIEW  Comparison: 10/05/2005  Findings: The heart size and pulmonary vascularity are normal and the lungs are clear.  Moderate thoracolumbar scoliosis.  No acute osseous abnormality.  IMPRESSION: No acute abnormality.   Original Report Authenticated By: Francene Boyers, M.D.    Assessment/Plan Principal Problem:   Syncope Active Problems:   Macrocytosis   1. Syncope 1. Per history and presentation, suggestive of neurocardiogenic source 2. Will admit to med-tele 3. Will f/u on head CT 4. Will check carotid dopplers and a 2D echo 2. Macrocytosis without anemia 1. Elevated blood ETOH level in ED 2. Suspect B12 / folate deficiency - will check levels 3. Will start empirically on banana bag 3. DVT prophylaxis 1. Heparin subQ  Code Status: Full (must indicate code status--if unknown or must be presumed, indicate so) Family Communication: Pt in room (indicate person spoken with, if applicable, with phone number if by telephone) Disposition Plan: Pending (indicate anticipated LOS)  Time spent:  Necha Harries K [facility] Pager [phone]  If 7PM-7AM, please contact night-coverage [email or web address] Password [facility] 08/16/2013, 10:17 AM

## 2013-08-17 DIAGNOSIS — K5730 Diverticulosis of large intestine without perforation or abscess without bleeding: Secondary | ICD-10-CM

## 2013-08-17 DIAGNOSIS — R55 Syncope and collapse: Secondary | ICD-10-CM

## 2013-08-17 LAB — CBC
HCT: 32.3 % — ABNORMAL LOW (ref 36.0–46.0)
MCH: 36.2 pg — ABNORMAL HIGH (ref 26.0–34.0)
MCHC: 35.3 g/dL (ref 30.0–36.0)
MCV: 102.5 fL — ABNORMAL HIGH (ref 78.0–100.0)
Platelets: 181 10*3/uL (ref 150–400)
WBC: 5.7 10*3/uL (ref 4.0–10.5)

## 2013-08-17 LAB — COMPREHENSIVE METABOLIC PANEL
ALT: 54 U/L — ABNORMAL HIGH (ref 0–35)
Albumin: 2.8 g/dL — ABNORMAL LOW (ref 3.5–5.2)
Alkaline Phosphatase: 66 U/L (ref 39–117)
BUN: 13 mg/dL (ref 6–23)
Potassium: 3.7 mEq/L (ref 3.5–5.1)
Sodium: 147 mEq/L — ABNORMAL HIGH (ref 135–145)
Total Protein: 5.7 g/dL — ABNORMAL LOW (ref 6.0–8.3)

## 2013-08-17 NOTE — Progress Notes (Signed)
Pt provided with dc instructions and educaiton. Pt verbalized understanding. Pt has no questions. IV removed with tip intact. Heart monitor cleaned and returned to front. Levonne Spiller, RN

## 2013-08-17 NOTE — Discharge Summary (Signed)
Physician Discharge Summary  Catherine Harrington KGM:[id number] DOB: [birth year] DOA: 08/16/2013  PCP: Enrique Sack, MD  Admit date: 08/16/2013 Discharge date: 08/17/2013  Time spent: 35 minutes  Recommendations for Outpatient Follow-up:  1. Follow up with PCP in 1-2 weeks  Discharge Diagnoses:  Principal Problem:   Syncope Active Problems:   Macrocytosis   Discharge Condition: Stable  Diet recommendation: Regular  There were no vitals filed for this visit.  History of present illness:  Catherine Harrington is a 67 y.o. female With a hx of lupus who presented to the ED s/p acute syncope with no pre-syncopal symptoms. Recalled up to the toilet this AM to void when she spontaneously had LOC. Awoke without postictal state. No bowel/bladder incontinence. No CP or SOB. In the ED, pt noted to have elevated ETOH level at 52.   Hospital Course:  The patient was admitted to the floor for work up for neurocardiogenic syncope. A head CT was unremarkable. A 2D echo and carotid dopplers were found to be unremarkable as well. Of note, the patient was noted to have a positive alcohol level of 52 in the ED with macrocytosis. She was empirically started on a banana bag. B12 was normal. Folate levels pending. The patient was otherwise stable through this hospital course.  Discharge Exam: Filed Vitals:   08/16/13 1030 08/16/13 1114 08/16/13 2150 08/17/13 0526  BP: 141/60 106/66 127/50 138/78  Pulse: 87 88 63 66  Temp:  98.3 F (36.8 C) 99.4 F (37.4 C) 98.3 F (36.8 C)  TempSrc:  Oral Oral Oral  Resp:  18 18 18   SpO2: 96% 97% 96% 95%    General: Awake, in nad  Cardiovascular: regular, s1, s2 Respiratory: normal resp effort, no wheezing  Discharge Instructions   Future Appointments Provider Department Dept Phone   09/19/2013 2:40 PM Dara Lords, MD [facility] [phone]       Medication List    ASK your doctor about these medications       calcium carbonate  600 MG Tabs tablet  Commonly known as:  OS-CAL  Take 600 mg by mouth 2 (two) times daily with a meal.     doxazosin 8 MG tablet  Commonly known as:  CARDURA  Take 8 mg by mouth at bedtime.     ferrous sulfate 325 (65 FE) MG tablet  Take 325 mg by mouth daily with breakfast.     fexofenadine 180 MG tablet  Commonly known as:  ALLEGRA  Take 180 mg by mouth daily.     fish oil-omega-3 fatty acids 1000 MG capsule  Take 2 g by mouth daily.     GLUCOSAMINE PO  Take 1 tablet by mouth daily.     losartan 100 MG tablet  Commonly known as:  COZAAR  Take 100 mg by mouth daily.     multivitamin tablet  Take 1 tablet by mouth daily.     OVER THE COUNTER MEDICATION  Take 1 tablet by mouth daily. Tumeric       Allergies  Allergen Reactions  . Midol [Ibuprofen]   . Sulfonamide Derivatives     REACTION: throat swells      The results of significant diagnostics from this hospitalization (including imaging, microbiology, ancillary and laboratory) are listed below for reference.    Significant Diagnostic Studies: Dg Chest 2 View  08/16/2013   *RADIOLOGY REPORT*  Clinical Data: Loss of consciousness  CHEST - 2 VIEW  Comparison: 10/05/2005  Findings: The heart size  and pulmonary vascularity are normal and the lungs are clear.  Moderate thoracolumbar scoliosis.  No acute osseous abnormality.  IMPRESSION: No acute abnormality.   Original Report Authenticated By: Francene Boyers, M.D.   Ct Head Wo Contrast  08/16/2013   CLINICAL DATA:  67 year old female with syncope.  EXAM: CT HEAD WITHOUT CONTRAST  TECHNIQUE: Contiguous axial images were obtained from the base of the skull through the vertex without intravenous contrast.  COMPARISON:  None.  FINDINGS: Visualized paranasal sinuses and mastoids are clear. No acute osseous abnormality identified. Visualized orbits and scalp soft tissues are within normal limits.  Mild Calcified atherosclerosis at the skull base. No midline shift,  ventriculomegaly, mass effect, evidence of mass lesion, intracranial hemorrhage or evidence of cortically based acute infarction. Gray-white matter differentiation is within normal limits throughout the brain. No suspicious intracranial vascular hyperdensity.  IMPRESSION: Normal for age non contrast CT appearance of the brain.   Electronically Signed   By: Augusto Gamble M.D.   On: 08/16/2013 17:30    Microbiology: No results found for this or any previous visit (from the past 240 hour(s)).   Labs: Basic Metabolic Panel:  Recent Labs Lab 08/16/13 0620 08/16/13 1038 08/17/13 0515  NA 134*  --  147*  K 4.0  --  3.7  CL 101  --  116*  CO2 19  --  24  GLUCOSE 97  --  91  BUN 12  --  13  CREATININE 0.94 0.87 0.97  CALCIUM 10.0  --  8.4   Liver Function Tests:  Recent Labs Lab 08/16/13 0620 08/17/13 0515  AST 86* 66*  ALT 67* 54*  ALKPHOS 85 66  BILITOT 0.3 0.4  PROT 7.2 5.7*  ALBUMIN 3.6 2.8*   No results found for this basename: LIPASE, AMYLASE,  in the last 168 hours No results found for this basename: AMMONIA,  in the last 168 hours CBC:  Recent Labs Lab 08/16/13 0620 08/16/13 1038 08/17/13 0515  WBC 5.1 5.8 5.7  NEUTROABS 3.4  --   --   HGB 12.7 12.0 11.4*  HCT 35.4* 34.0* 32.3*  MCV 101.7* 102.7* 102.5*  PLT 191 191 181   Cardiac Enzymes: No results found for this basename: CKTOTAL, CKMB, CKMBINDEX, TROPONINI,  in the last 168 hours BNP: BNP (last 3 results) No results found for this basename: PROBNP,  in the last 8760 hours CBG:  Recent Labs Lab 08/16/13 0613  GLUCAP 90    Signed:  CHIU, STEPHEN K  [facility] 08/17/2013, 8:12 AM

## 2013-08-17 NOTE — Progress Notes (Signed)
  Echocardiogram 2D Echocardiogram has been performed.  Catherine Harrington 08/17/2013, 9:05 AM

## 2013-08-17 NOTE — Progress Notes (Addendum)
VASCULAR LAB PRELIMINARY  PRELIMINARY  PRELIMINARY  PRELIMINARY  Carotid duplex completed.    Preliminary report:  Bilateral:  1-39% ICA stenosis.  Bilateral:  Vertebral artery flow is antegrade.     Angeni Chaudhuri, RVT 08/17/2013, 10:34 AM

## 2013-08-20 ENCOUNTER — Other Ambulatory Visit: Payer: Self-pay

## 2013-08-20 DIAGNOSIS — Z1231 Encounter for screening mammogram for malignant neoplasm of breast: Secondary | ICD-10-CM

## 2013-09-19 ENCOUNTER — Encounter: Payer: Self-pay | Admitting: Gynecology

## 2013-09-19 ENCOUNTER — Ambulatory Visit (INDEPENDENT_AMBULATORY_CARE_PROVIDER_SITE_OTHER): Payer: Medicare Other | Admitting: Gynecology

## 2013-09-19 VITALS — BP 110/60 | Ht 62.0 in | Wt 112.0 lb

## 2013-09-19 DIAGNOSIS — N951 Menopausal and female climacteric states: Secondary | ICD-10-CM

## 2013-09-19 DIAGNOSIS — N952 Postmenopausal atrophic vaginitis: Secondary | ICD-10-CM

## 2013-09-19 NOTE — Progress Notes (Signed)
Catherine Harrington 07-10-1946 [id number]        67 y.o.  G2P2 for followup exam.  Several issues noted below.  Past medical history,surgical history, medications, allergies, family history and social history were all reviewed and documented in the EPIC chart.  ROS:  Performed and pertinent positives and negatives are included in the history, assessment and plan .  Exam: Kim assistant Filed Vitals:   09/19/13 1458  BP: 110/60  Height: 5\' 2"  (1.575 m)  Weight: 112 lb (50.803 kg)   General appearance  Normal Skin grossly normal Head/Neck normal with no cervical or supraclavicular adenopathy thyroid normal Lungs  clear Cardiac RR, without RMG Abdominal  soft, nontender, without masses, organomegaly or hernia Breasts  examined lying and sitting without masses, retractions, discharge or axillary adenopathy. Pelvic  Ext/BUS/vagina  normal with atrophic changes  Cervix  normal with atrophic changes   Uterus  anteverted, normal size, shape and contour, midline and mobile nontender   Adnexa  Without masses or tenderness    Anus and perineum  normal   Rectovaginal  normal sphincter tone without palpated masses or tenderness.    Assessment/Plan:  67 y.o. G2P2 female for followup exam.   1. Postmenopausal/menopausal symptoms. Patient is still having hot flashes and sweats since stopping her HRT. No bleeding at all. Options to include OTC products discussed. Patient is going to try. She will followup with me if this is unacceptable she wants to rediscuss treatment alternatives. Patient knows to report any vaginal bleeding. 2. Mammography scheduled in 2 weeks. SBE monthly reviewed. 3. DEXA 2012 normal. Repeat at five-year interval. 4. Pap smear several years ago. No Pap smear done today. No history of abnormal Pap smears previously. We have discussed this previously and she has agreed with no further screening issues at age 47. 5. Colonoscopy 2011. Repeat at their recommended interval. 6. Stop  smoking. I again discussed stop smoking strategies with her. She acknowledges my recommendation but is not ready to quit. 7. Health maintenance. Patient recently saw her primary physician historically who does all of her blood work. She asked if she needed to continue to see me as he will do her pelvic exams and breast exams. Reviewed with her that as long as she is comfortable with this then she can see me when necessary.  Note: This document was prepared with digital dictation and possible smart phrase technology. Any transcriptional errors that result from this process are unintentional.   Dara Lords MD, 3:24 PM 09/19/2013

## 2013-09-19 NOTE — Addendum Note (Signed)
Addended by: Dara Lords on: 09/19/2013 05:01 PM   Modules accepted: Level of Service

## 2013-09-19 NOTE — Patient Instructions (Signed)
Followup in 1 year for examination either by your primary doctor or by your gynecologist

## 2013-09-20 LAB — URINALYSIS W MICROSCOPIC + REFLEX CULTURE
Glucose, UA: NEGATIVE mg/dL
Hgb urine dipstick: NEGATIVE
Protein, ur: NEGATIVE mg/dL

## 2013-09-21 LAB — URINE CULTURE: Organism ID, Bacteria: NO GROWTH

## 2013-09-26 ENCOUNTER — Ambulatory Visit: Payer: Medicare Other

## 2013-10-03 ENCOUNTER — Ambulatory Visit
Admission: RE | Admit: 2013-10-03 | Discharge: 2013-10-03 | Disposition: A | Discharge status: Home / Self Care | Payer: Medicare Other | Source: Ambulatory Visit

## 2013-10-03 DIAGNOSIS — Z1231 Encounter for screening mammogram for malignant neoplasm of breast: Secondary | ICD-10-CM

## 2014-05-30 ENCOUNTER — Other Ambulatory Visit: Payer: Self-pay | Admitting: Physical Medicine and Rehabilitation

## 2014-05-30 DIAGNOSIS — M545 Low back pain, unspecified: Secondary | ICD-10-CM

## 2014-06-11 ENCOUNTER — Ambulatory Visit
Admission: RE | Admit: 2014-06-11 | Discharge: 2014-06-11 | Disposition: A | Discharge status: Home / Self Care | Payer: Medicare Other | Source: Ambulatory Visit | Attending: Physical Medicine and Rehabilitation | Admitting: Physical Medicine and Rehabilitation

## 2014-06-11 DIAGNOSIS — M545 Low back pain, unspecified: Secondary | ICD-10-CM

## 2014-08-28 ENCOUNTER — Other Ambulatory Visit: Payer: Self-pay

## 2014-08-28 DIAGNOSIS — Z1231 Encounter for screening mammogram for malignant neoplasm of breast: Secondary | ICD-10-CM

## 2014-09-30 ENCOUNTER — Encounter: Payer: Self-pay | Admitting: Gynecology

## 2014-10-07 ENCOUNTER — Ambulatory Visit
Admission: RE | Admit: 2014-10-07 | Discharge: 2014-10-07 | Disposition: A | Discharge status: Home / Self Care | Payer: Medicare Other | Source: Ambulatory Visit

## 2014-10-07 DIAGNOSIS — Z1231 Encounter for screening mammogram for malignant neoplasm of breast: Secondary | ICD-10-CM

## 2014-10-09 ENCOUNTER — Other Ambulatory Visit: Payer: Self-pay | Admitting: Internal Medicine

## 2014-10-09 DIAGNOSIS — R748 Abnormal levels of other serum enzymes: Secondary | ICD-10-CM

## 2014-10-17 ENCOUNTER — Ambulatory Visit
Admission: RE | Admit: 2014-10-17 | Discharge: 2014-10-17 | Disposition: A | Discharge status: Home / Self Care | Payer: Medicare Other | Source: Ambulatory Visit | Attending: Internal Medicine | Admitting: Internal Medicine

## 2014-10-17 DIAGNOSIS — R748 Abnormal levels of other serum enzymes: Secondary | ICD-10-CM

## 2015-02-03 ENCOUNTER — Encounter: Payer: Self-pay | Admitting: Diagnostic Neuroimaging

## 2015-02-03 ENCOUNTER — Ambulatory Visit (INDEPENDENT_AMBULATORY_CARE_PROVIDER_SITE_OTHER): Payer: Medicare Other | Admitting: Diagnostic Neuroimaging

## 2015-02-03 VITALS — BP 130/74 | HR 91 | Ht 60.0 in | Wt 107.6 lb

## 2015-02-03 DIAGNOSIS — R220 Localized swelling, mass and lump, head: Secondary | ICD-10-CM

## 2015-02-03 DIAGNOSIS — H02402 Unspecified ptosis of left eyelid: Secondary | ICD-10-CM

## 2015-02-03 NOTE — Patient Instructions (Signed)
Monitor symptoms.  I will check blood test for left eyelid drooping.

## 2015-02-03 NOTE — Progress Notes (Signed)
[facility]  PATIENT: Catherine Harrington DOB: 02/27/1946  REFERRING CLINICIAN: Jearld FentonByers HISTORY FROM: patient  REASON FOR VISIT: new consult   HISTORICAL  CHIEF COMPLAINT:  Chief Complaint  Patient presents with  . New Evaluation    left eye swelling and pain    HISTORY OF PRESENT ILLNESS:   69 year old right-handed female here for evaluation of left facial swelling and discomfort for past 10 years. Patient has had multiple dental procedures in the left maxillary region over the last 30 years. Over the past 10 years she's had intermittent episodes of swelling over the left maxillary region, associated with left eyelid closure/drooping, pressure in the sinus region. No severe shooting electrical pain or discomfort. No significant headaches. Patient went to PCP, ENT, dentist for evaluation. Apparently patient needs additional dental work in the left maxillary region again. She wants to hold off until this neurologic evaluation.   REVIEW OF SYSTEMS: Full 14 system review of systems performed and notable only for only as per history of present illness.  ALLERGIES: Allergies  Allergen Reactions  . Sulfonamide Derivatives     REACTION: throat swells  . Midol [Ibuprofen]     HOME MEDICATIONS: Outpatient Prescriptions Prior to Visit  Medication Sig Dispense Refill  . calcium carbonate (OS-CAL) 600 MG TABS Take 600 mg by mouth 2 (two) times daily with a meal.      . doxazosin (CARDURA) 8 MG tablet Take 8 mg by mouth at bedtime.      . ferrous sulfate 325 (65 FE) MG tablet Take 325 mg by mouth daily with breakfast.      . fexofenadine (ALLEGRA) 180 MG tablet Take 180 mg by mouth daily.      . fish oil-omega-3 fatty acids 1000 MG capsule Take 2 g by mouth daily.      . Glucosamine HCl (GLUCOSAMINE PO) Take 1 tablet by mouth daily.    Marland Kitchen. losartan (COZAAR) 100 MG tablet Take 100 mg by mouth daily.      . Multiple Vitamin (MULTIVITAMIN) tablet Take 1 tablet by mouth daily.       Marland Kitchen. OVER THE COUNTER MEDICATION Take 1 tablet by mouth daily. Tumeric     No facility-administered medications prior to visit.    PAST MEDICAL HISTORY: Past Medical History  Diagnosis Date  . Hypertension   . Lupus     "just the rash and arthritis" (08/16/2013)  . Family history of anesthesia complication     "daughter doesn't wake up easy from it" (08/16/2013)  . Iron deficiency anemia   . Arthritis     "related to the lupus; joints" (08/16/2013)  . Scoliosis   . Lumbar disc disease   . Syncope and collapse     "lost consciousness ~ 4-5 seconds; didn't hurt myself" (08/16/2013)    PAST SURGICAL HISTORY: Past Surgical History  Procedure Laterality Date  . Cesarean section  1967; 1971  . Mouth surgery  2013    " infection under bridge cleaned out, etc" (08/16/2013)  . Tubal ligation      FAMILY HISTORY: Family History  Problem Relation Age of Onset  . Hypertension Mother   . Hypertension Father     SOCIAL HISTORY:  History   Social History  . Marital Status: Married    Spouse Name: Alinda Moneyony  . Number of Children: 2  . Years of Education: college   Occupational History  . retired    Social History Main Topics  . Smoking status: Current Every Day  Smoker -- 1.00 packs/day for 48 years    Types: Cigarettes  . Smokeless tobacco: Never Used  . Alcohol Use: 12.6 oz/week    21 Cans of beer per week     Comment: 08/16/2013 3-4 drinks daily  . Drug Use: No  . Sexual Activity: Not Currently    Birth Control/ Protection: Post-menopausal   Other Topics Concern  . Not on file   Social History Narrative   Lives with husband   Drinks caffeine 3 cups a day     PHYSICAL EXAM  Filed Vitals:   02/03/15 1044  BP: 130/74  Pulse: 91  Height: 5' (1.524 m)  Weight: 107 lb 9.6 oz (48.807 kg)    Body mass index is 21.01 kg/(m^2).   Visual Acuity Screening   Right eye Left eye Both eyes  Without correction:     With correction: 20/200 20/200     No flowsheet data  found.  GENERAL EXAM: Patient is in no distress; well developed, nourished and groomed; neck is supple  CARDIOVASCULAR: Regular rate and rhythm, no murmurs, no carotid bruits  NEUROLOGIC: MENTAL STATUS: awake, alert, oriented to person, place and time, recent and remote memory intact, normal attention and concentration, language fluent, comprehension intact, naming intact, fund of knowledge appropriate CRANIAL NERVE: no papilledema on fundoscopic exam, pupils equal and reactive to light, visual fields full to confrontation, extraocular muscles intact, no nystagmus, facial sensation and strength symmetric, SLIGHT PTOSIS OF LEFT EYELID, hearing intact, palate elevates symmetrically, uvula midline, shoulder shrug symmetric, tongue midline. MOTOR: normal bulk and tone, full strength in the BUE, BLE SENSORY: normal and symmetric to light touch, temperature, vibration COORDINATION: finger-nose-finger, fine finger movements normal; EXCEPT MILD INTENTION TREMOR WITH LEFT ARM REFLEXES: deep tendon reflexes present and symmetric GAIT/STATION: narrow based gait; able to walk on toes, heels and tandem; romberg is negative    DIAGNOSTIC DATA (LABS, IMAGING, TESTING) - I reviewed patient records, labs, notes, testing and imaging myself where available.  Lab Results  Component Value Date   WBC 5.7 08/17/2013   HGB 11.4* 08/17/2013   HCT 32.3* 08/17/2013   MCV 102.5* 08/17/2013   PLT 181 08/17/2013      Component Value Date/Time   NA 147* 08/17/2013 0515   K 3.7 08/17/2013 0515   CL 116* 08/17/2013 0515   CO2 24 08/17/2013 0515   GLUCOSE 91 08/17/2013 0515   BUN 13 08/17/2013 0515   CREATININE 0.97 08/17/2013 0515   CALCIUM 8.4 08/17/2013 0515   PROT 5.7* 08/17/2013 0515   ALBUMIN 2.8* 08/17/2013 0515   AST 66* 08/17/2013 0515   ALT 54* 08/17/2013 0515   ALKPHOS 66 08/17/2013 0515   BILITOT 0.4 08/17/2013 0515   GFRNONAA 59* 08/17/2013 0515   GFRAA 68* 08/17/2013 0515   No results  found for: CHOL, HDL, LDLCALC, LDLDIRECT, TRIG, CHOLHDL No results found for: ZOXW9U Lab Results  Component Value Date   VITAMINB12 770 08/16/2013   Lab Results  Component Value Date   TSH 3.160 08/16/2013    I reviewed images myself and agree with interpretation. -VRP  08/16/13 CT head - Normal for age non contrast CT appearance of the brain.     ASSESSMENT AND PLAN  69 y.o. year old female here with left facial swelling, left eyelid ptosis, mild aching sensation over left maxillary region for past 10 years, intermittently. Also with extensive dental procedures in the left maxillary region over past 30 years.  Ddx: post-dental procedure complications vs atypical  facial pain  PLAN: - check AchR to eval left eye ptosis (? Myasthenia), but this wouldn't explain the left facial pain/swelling sensations - continue with anticipated dental procedure - no severe pain sxs at this time; if they worsen, may consider gabapentin or carbamazepine  Orders Placed This Encounter  Procedures  . Acetylcholine Receptor, Binding   Return in about 3 months (around 05/06/2015).    Suanne Marker, MD 02/03/2015, 11:24 AM Certified in Neurology, Neurophysiology and Neuroimaging  [facility] [address] [place], [place] [address] [phone]

## 2015-02-04 LAB — ACETYLCHOLINE RECEPTOR, BINDING: AChR Binding Ab, Serum: <0.03 nmol/L (ref 0.00–0.24)

## 2015-05-06 ENCOUNTER — Ambulatory Visit: Payer: Medicare Other | Admitting: Diagnostic Neuroimaging

## 2015-09-02 ENCOUNTER — Other Ambulatory Visit: Payer: Self-pay

## 2015-09-02 DIAGNOSIS — Z1231 Encounter for screening mammogram for malignant neoplasm of breast: Secondary | ICD-10-CM

## 2015-10-10 ENCOUNTER — Ambulatory Visit
Admission: RE | Admit: 2015-10-10 | Discharge: 2015-10-10 | Disposition: A | Discharge status: Home / Self Care | Payer: Medicare Other | Source: Ambulatory Visit

## 2015-10-10 DIAGNOSIS — Z1231 Encounter for screening mammogram for malignant neoplasm of breast: Secondary | ICD-10-CM

## 2016-05-18 ENCOUNTER — Encounter: Payer: Self-pay | Admitting: Oncology

## 2016-05-18 ENCOUNTER — Ambulatory Visit (INDEPENDENT_AMBULATORY_CARE_PROVIDER_SITE_OTHER): Payer: Medicare Other | Admitting: Oncology

## 2016-05-18 VITALS — BP 146/59 | HR 94 | Temp 98.2°F | Wt 104.5 lb

## 2016-05-18 DIAGNOSIS — D539 Nutritional anemia, unspecified: Secondary | ICD-10-CM | POA: Diagnosis not present

## 2016-05-18 DIAGNOSIS — D7589 Other specified diseases of blood and blood-forming organs: Secondary | ICD-10-CM

## 2016-05-18 DIAGNOSIS — L930 Discoid lupus erythematosus: Secondary | ICD-10-CM | POA: Insufficient documentation

## 2016-05-18 DIAGNOSIS — F17210 Nicotine dependence, cigarettes, uncomplicated: Secondary | ICD-10-CM

## 2016-05-18 DIAGNOSIS — Z148 Genetic carrier of other disease: Secondary | ICD-10-CM

## 2016-05-18 DIAGNOSIS — M47817 Spondylosis without myelopathy or radiculopathy, lumbosacral region: Secondary | ICD-10-CM | POA: Insufficient documentation

## 2016-05-18 HISTORY — DX: Spondylosis without myelopathy or radiculopathy, lumbosacral region: M47.817

## 2016-05-18 HISTORY — DX: Hemochromatosis, unspecified: E83.119

## 2016-05-18 HISTORY — DX: Discoid lupus erythematosus: L93.0

## 2016-05-18 LAB — SAVE SMEAR

## 2016-05-18 NOTE — Progress Notes (Signed)
Patient ID: Catherine Harrington, female   DOB: 1946-08-09, 70 y.o.   MRN: [id number] New Patient Hematology   Catherine Harrington [id number] May 10, 1946 70 y.o. 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 She has primarily cutaneous involvement. She has not had much in the way of small joint involvement. She was on Plaquenil in the past but it did not appear to help and she stopped it. Most recent ANA recorded on 01/06/2015 was 1:160, speckled pattern. She has been having increasing problems with low back pain and what she believes is right paraspinal muscle pain. She has seen orthopedic surgeon Dr. Ernestina Patches. She has had some steroid injections. She believes that an injection of some type is planned into her paraspinal muscles. She has had twice normal transaminase elevations at least since 2013 according to records provided by Dr. Nyoka Cowden. She has had a mild macrocytic anemia with hemoglobins in the 12-13 gram range with MCV of 105 with normal D22, folic acid, and red cell folate levels. She was taking iron supplements for some time. However recent iron studies done 03/03/2015 showed serum iron of 126 with percent saturation 53. Off iron supplements repeat iron on 05/13/2015 was 72 but more recent laboratory done on 09/15/2015 again showed iron of 216 with percent saturation 85. Ferritin was 1620 on 11/19/2015. Iron saturation 81%. She was fine to have mild hypothyroidism with TSH 5.85 on 09/15/2015 and started on Synthroid. She denies any history of hepatitis, yellow jaundice, or mononucleosis. Hepatitis A, B, C serology tested in October 2013 and was negative. She states she has been vaccinated against hepatitis B. She drinks 3-4 beers daily but does not drink any hard liquor. She smokes one pack of  cigarettes daily. There is no family history of any liver disease or hemochromatosis. Both of her parents lived into their 69s. She has an 70 year old brother who is alive and well. She lost a sister in her late 9s with heart disease. She has 2 daughters aged 17 and 22,  both are healthy. She has no history of diabetes, she was told that her blood lipase level was elevated at times in the past. No clinical pancreatitis. No history of gonadal failure.  PMH: Past Medical History  Diagnosis Date  . Hypertension   . Lupus ([place])     "just the rash and arthritis" (08/16/2013)  . Family history of anesthesia complication     "daughter doesn't wake up easy from it" (08/16/2013)  . Iron deficiency anemia   . Arthritis     "related to the lupus; joints" (08/16/2013)  . Scoliosis   . Lumbar disc disease   . Syncope and collapse     "lost consciousness ~ 4-5 seconds; didn't hurt myself" (08/16/2013)  . 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 Cesarean section  1967; 1971  . Mouth surgery  2013    " infection under bridge cleaned out, etc" (08/16/2013)  . Tubal ligation      Allergies: Allergies  Allergen Reactions  . Sulfonamide Derivatives     REACTION: throat swells  . Midol [Ibuprofen]     Medications:  Current outpatient prescriptions:  .  levothyroxine (SYNTHROID, LEVOTHROID) 50 MCG tablet, Take 50 mcg by mouth daily before breakfast., Disp: , Rfl:  .  calcium carbonate (OS-CAL) 600 MG TABS, Take 600 mg by mouth 2 (two) times daily with a meal.  , Disp: , Rfl:  .  doxazosin (CARDURA) 8 MG tablet, Take 8 mg by mouth at bedtime.  , Disp: , Rfl:  .  fexofenadine (ALLEGRA) 180 MG tablet, Take 180 mg by mouth daily.  , Disp: , Rfl:  .  fish oil-omega-3 fatty acids 1000 MG capsule, Take 2 g by mouth daily.  , Disp: , Rfl:  .   fluticasone (FLONASE) 50 MCG/ACT nasal spray, Place 2 sprays into both nostrils daily., Disp: , Rfl: 6 .  Glucosamine HCl (GLUCOSAMINE PO), Take 1 tablet by mouth daily., Disp: , Rfl:  .  hydrOXYzine (ATARAX/VISTARIL) 10 MG tablet, , Disp: , Rfl: 1 .  losartan (COZAAR) 100 MG tablet, Take 100 mg by mouth daily.  , Disp: , Rfl:  .  Multiple Vitamin (MULTIVITAMIN) tablet, Take 1 tablet by mouth daily.  , Disp: , Rfl:   Social History: Married. 2 healthy daughters. 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    She has a 48 pack-year smoking history. She has never used smokeless tobacco.  she drinks about 3 beers per day she does not use illicit drugs.  Family History: Family History  Problem Relation Age of Onset  . Hypertension Mother   . Hypertension Father     Review of Systems: See HPI No headache or change in vision. No sore throat or trouble swallowing. No dyspnea, chest pain, or palpitations. No abdominal pain or change in bowel habit. No hematochezia or melena. No urinary tract symptoms. No paresthesias. No abnormal skin changes or rashes. No bruising. Remaining ROS negative.  Physical Exam: Blood pressure 146/59, pulse 94, temperature 98.2 F (36.8 C), temperature source Oral, weight 104 lb 8 oz (47.401 kg), SpO2 99 %. Wt Readings from Last 3 Encounters:  05/18/16 104 lb 8 oz (47.401 kg)  02/03/15 107 lb 9.6 oz (48.807 kg)  09/19/13 112 lb (50.803 kg)     General appearance: Petite Caucasian woman HENNT: Pharynx no erythema, exudate, mass, or ulcer. No thyromegaly or thyroid nodules Lymph nodes: No cervical, supraclavicular, or axillary lymphadenopathy Breasts:  Lungs: Clear to auscultation, resonant to percussion throughout Heart: Regular rhythm, no murmur, no gallop, no rub, no click, no edema Abdomen: Soft, nontender, normal bowel sounds, no mass, no organomegaly Extremities: No edema, no calf tenderness Musculoskeletal:  no joint deformities GU:  Vascular: Carotid pulses 2+, no bruits, distal pulses: Dorsalis pedis 1+ symmetric Neurologic: Alert, oriented, PERRLA, optic discs sharp and vessels normal, no hemorrhage or exudate, cranial nerves grossly normal, motor strength 5 over 5, reflexes 1+ symmetric, upper body coordination normal, gait normal, vibration sensation mildly decreased over the fingertips by tuning fork exam. Skin: Palmar erythema. No spider hemangiomas.or ecchymosis. Spotty hyperpigmented lesions across the anterior chest related to her discoid lupus.    Lab Results: Lab Results  Component Value Date   WBC 5.7 08/17/2013   HGB 11.4* 08/17/2013   HCT 32.3* 08/17/2013   MCV 102.5* 08/17/2013   PLT 181 08/17/2013     Chemistry      Component Value Date/Time   NA 147* 08/17/2013 0515   K 3.7 08/17/2013 0515   CL 116* 08/17/2013 0515   CO2 24 08/17/2013 0515   BUN 13 08/17/2013 0515  CREATININE 0.97 08/17/2013 0515      Component Value Date/Time   CALCIUM 8.4 08/17/2013 0515   ALKPHOS 66 08/17/2013 0515   AST 66* 08/17/2013 0515   ALT 54* 08/17/2013 0515   BILITOT 0.4 08/17/2013 0515    BUN 17, creatinine 1.5, Iron 188, saturation 82%, ferritin 1308 Hemoglobin 11.7, hematocrit 35.2, MCV 109, ESR 11 mm SGOT 117, SGPT 77, bilirubin 0.3, alkaline phosphatase 95, albumin 4.1   Review of peripheral blood film: pending   Radiological Studies: No results found.    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 In general, carriers do not have significant elevation of ferritin unless they have other concomitant liver disease. This lady has underlying lupus with chronic inflammation. This could involve her liver and could also spuriously elevate the ferritin as an acute phase reactant. She has mild macrocytic anemia. She may consume more alcohol than she admits to. Thyroid disease may also change membrane cholesterol and cause elevated MCV. There is no  family history of liver disease or hemachromatosis and almost all of her family members have lived to ripe old ages.  We discussed hemochromatosis in general and the fact that went ferritin is over 1000, iron stores to get deposited in major organs particularly the liver and heart. The most efficient way to remove iron is through phlebotomy. We do have oral iron chelating agents which are expensive and not as efficient.  Recommendation: I am going to repeat liver functions and iron studies with ferritin at this time. Check erythrocyte sedimentation rate as an indicator of chronic inflammation Obtain an ultrasound of her liver with the elastography technique to assess for fibrosis I will then have her come back when these data are available to discuss management. At this point she would be very much against a phlebotomy program and if we have to treat her she would prefer medical therapy.      Annia Belt, MD 05/18/2016, 5:29 PM

## 2016-05-18 NOTE — Patient Instructions (Signed)
To lab today Schedule [facility] Return visit 1st available after Ultrasound done

## 2016-05-19 ENCOUNTER — Other Ambulatory Visit: Payer: Self-pay | Admitting: Oncology

## 2016-05-19 LAB — COMPREHENSIVE METABOLIC PANEL
ALK PHOS: 95 IU/L (ref 39–117)
ALT: 77 IU/L — AB (ref 0–32)
AST: 117 IU/L — ABNORMAL HIGH (ref 0–40)
Albumin/Globulin Ratio: 1.4 (ref 1.2–2.2)
Albumin: 4.1 g/dL (ref 3.5–4.8)
BILIRUBIN TOTAL: 0.3 mg/dL (ref 0.0–1.2)
BUN/Creatinine Ratio: 11 — ABNORMAL LOW (ref 12–28)
BUN: 17 mg/dL (ref 8–27)
CHLORIDE: 104 mmol/L (ref 96–106)
CO2: 22 mmol/L (ref 18–29)
Calcium: 9.4 mg/dL (ref 8.7–10.3)
Creatinine, Ser: 1.54 mg/dL — ABNORMAL HIGH (ref 0.57–1.00)
GFR calc Af Amer: 39 mL/min/{1.73_m2} — ABNORMAL LOW (ref >59)
GFR calc non Af Amer: 34 mL/min/{1.73_m2} — ABNORMAL LOW (ref >59)
GLUCOSE: 100 mg/dL — AB (ref 65–99)
Globulin, Total: 3 g/dL (ref 1.5–4.5)
Potassium: 5.3 mmol/L — ABNORMAL HIGH (ref 3.5–5.2)
Sodium: 141 mmol/L (ref 134–144)
TOTAL PROTEIN: 7.1 g/dL (ref 6.0–8.5)

## 2016-05-19 LAB — CBC WITH DIFFERENTIAL/PLATELET
BASOS ABS: 0 10*3/uL (ref 0.0–0.2)
Basos: 0 %
EOS (ABSOLUTE): 0 10*3/uL (ref 0.0–0.4)
Eos: 0 %
Hematocrit: 35.2 % (ref 34.0–46.6)
Hemoglobin: 11.7 g/dL (ref 11.1–15.9)
IMMATURE GRANS (ABS): 0 10*3/uL (ref 0.0–0.1)
Immature Granulocytes: 0 %
LYMPHS: 30 %
Lymphocytes Absolute: 1.5 10*3/uL (ref 0.7–3.1)
MCH: 36.2 pg — AB (ref 26.6–33.0)
MCHC: 33.2 g/dL (ref 31.5–35.7)
MCV: 109 fL — ABNORMAL HIGH (ref 79–97)
Monocytes Absolute: 0.4 10*3/uL (ref 0.1–0.9)
Monocytes: 9 %
NEUTROS ABS: 3.1 10*3/uL (ref 1.4–7.0)
NEUTROS PCT: 61 %
PLATELETS: 182 10*3/uL (ref 150–379)
RBC: 3.23 x10E6/uL — AB (ref 3.77–5.28)
RDW: 13.4 % (ref 12.3–15.4)
WBC: 5.1 10*3/uL (ref 3.4–10.8)

## 2016-05-19 LAB — IRON AND TIBC
IRON SATURATION: 82 % — AB (ref 15–55)
IRON: 188 ug/dL — AB (ref 27–139)
Total Iron Binding Capacity: 230 ug/dL — ABNORMAL LOW (ref 250–450)
UIBC: 42 ug/dL — ABNORMAL LOW (ref 118–369)

## 2016-05-19 LAB — SEDIMENTATION RATE: SED RATE: 11 mm/h (ref 0–40)

## 2016-05-19 LAB — FERRITIN: FERRITIN: 1308 ng/mL — AB (ref 15–150)

## 2016-06-17 ENCOUNTER — Ambulatory Visit (HOSPITAL_COMMUNITY)
Admission: RE | Admit: 2016-06-17 | Discharge: 2016-06-17 | Disposition: A | Discharge status: Home / Self Care | Payer: Medicare Other | Source: Ambulatory Visit | Attending: Oncology | Admitting: Oncology

## 2016-06-17 DIAGNOSIS — R932 Abnormal findings on diagnostic imaging of liver and biliary tract: Secondary | ICD-10-CM | POA: Insufficient documentation

## 2016-06-29 ENCOUNTER — Telehealth: Payer: Self-pay | Admitting: *Deleted

## 2016-06-29 ENCOUNTER — Encounter: Payer: Self-pay | Admitting: Oncology

## 2016-06-29 ENCOUNTER — Ambulatory Visit (INDEPENDENT_AMBULATORY_CARE_PROVIDER_SITE_OTHER): Payer: Medicare Other | Admitting: Oncology

## 2016-06-29 DIAGNOSIS — N183 Chronic kidney disease, stage 3 unspecified: Secondary | ICD-10-CM

## 2016-06-29 DIAGNOSIS — D7589 Other specified diseases of blood and blood-forming organs: Secondary | ICD-10-CM

## 2016-06-29 DIAGNOSIS — N289 Disorder of kidney and ureter, unspecified: Secondary | ICD-10-CM | POA: Diagnosis not present

## 2016-06-29 DIAGNOSIS — R10A1 Flank pain, right side: Secondary | ICD-10-CM | POA: Insufficient documentation

## 2016-06-29 DIAGNOSIS — D539 Nutritional anemia, unspecified: Secondary | ICD-10-CM | POA: Diagnosis not present

## 2016-06-29 DIAGNOSIS — R109 Unspecified abdominal pain: Secondary | ICD-10-CM

## 2016-06-29 DIAGNOSIS — F17210 Nicotine dependence, cigarettes, uncomplicated: Secondary | ICD-10-CM | POA: Diagnosis not present

## 2016-06-29 DIAGNOSIS — N189 Chronic kidney disease, unspecified: Secondary | ICD-10-CM | POA: Insufficient documentation

## 2016-06-29 HISTORY — DX: Nutritional anemia, unspecified: D53.9

## 2016-06-29 HISTORY — DX: Chronic kidney disease, stage 3 unspecified: N18.30

## 2016-06-29 HISTORY — DX: Unspecified abdominal pain: R10.9

## 2016-06-29 NOTE — Patient Instructions (Signed)
To lab today  Start weekly phlebotomy on Tuesday August 8 at [facility] short stay unit MD visit 8-10 weeks  Lab 1 week before visit

## 2016-06-29 NOTE — Progress Notes (Signed)
Hematology and Oncology Follow Up Visit  Catherine Harrington [id number] October 25, 1946 70 y.o. 06/29/2016 4:58 PM   Principle Diagnosis: Encounter Diagnoses  Name Primary?  . Chronic renal insufficiency, stage 3 (moderate)   . Acute right flank pain   . Hemochromatosis Yes  . Macrocytosis   . Macrocytic anemia   . Chronic renal insufficiency, stage III (moderate)      Interim History:  70 year old woman referred for further evaluation of chronic transaminase elevations with recent findings of increased iron, iron saturation, and ferritin. 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 Interpretation of the ferritin is confounded by an underlying collagen vascular disorder which might lead to chronic inflammation and potentially autoimmune hepatitis, and possible coexisting liver disease from long-standing alcohol use. In attempt to decipher some of these issues, I obtained an ESR which is normal at 11 mm making it less likely that she has chronic liver inflammation associated with her discoid lupus. I repeated a ferritin which confirms that she is in a range that is associated with organ deposition/damage at 1308. Serum iron 188. Percent saturation 82%. Ultrasound of the liver with additional fibro-elastography shows a homogeneous pattern to the liver parenchyma with no focal lesions. No splenomegaly. (No signs of portal hypertension). But there are areas of 3 and 4+ fibrosis.  Medications: reviewed  Allergies:  Allergies  Allergen Reactions  . Prednisone Anaphylaxis  . Sulfonamide Derivatives     REACTION: throat swells  . Midol [Ibuprofen]     Review of Systems: She is having episodic severe right flank pain which she attributes to muscle spasm from her chronic back pain. She is using a combination of baclofen, tramadol, and a nonsteroidal anti-inflammatory agent with good partial relief.  Physical Exam: Complete exam done at time of office consultation on  June 20. Not repeated today.  Blood pressure (!) 141/63, pulse 88, temperature 98 F (36.7 C), temperature source Oral, height 5' 2"  (1.575 m), weight 104 lb 14.4 oz (47.6 kg), SpO2 98 %. Wt Readings from Last 3 Encounters:  06/29/16 104 lb 14.4 oz (47.6 kg)  05/18/16 104 lb 8 oz (47.4 kg)  02/03/15 107 lb 9.6 oz (48.8 kg)       Lab Results: CBC W/Diff    Component Value Date/Time   WBC 5.1 05/18/2016 1631   WBC 5.7 08/17/2013 0515   RBC 3.23 (L) 05/18/2016 1631   RBC 3.15 (L) 08/17/2013 0515   HGB 11.4 (L) 08/17/2013 0515   HCT 35.2 05/18/2016 1631   PLT 182 05/18/2016 1631   MCV 109 (H) 05/18/2016 1631   MCH 36.2 (H) 05/18/2016 1631   MCH 36.2 (H) 08/17/2013 0515   MCHC 33.2 05/18/2016 1631   MCHC 35.3 08/17/2013 0515   RDW 13.4 05/18/2016 1631   LYMPHSABS 1.5 05/18/2016 1631   MONOABS 0.3 08/16/2013 0620   EOSABS 0.0 05/18/2016 1631   BASOSABS 0.0 05/18/2016 1631     Chemistry      Component Value Date/Time   NA 141 05/18/2016 1631   K 5.3 (H) 05/18/2016 1631   CL 104 05/18/2016 1631   CO2 22 05/18/2016 1631   BUN 17 05/18/2016 1631   CREATININE 1.54 (H) 05/18/2016 1631      Component Value Date/Time   CALCIUM 9.4 05/18/2016 1631   ALKPHOS 95 05/18/2016 1631   AST 117 (H) 05/18/2016 1631   ALT 77 (H) 05/18/2016 1631   BILITOT 0.3 05/18/2016 1631       Radiological  Studies: US Abdomen Complete W/elastography  Result Date: 06/17/2016 CLINICAL DATA:  New diagnosis of hemochromatosis 2 months ago. Ferritin level is 1,300. Assess for cirrhosis or focal liver lesions. EXAM: ULTRASOUND ABDOMEN COMPLETE ULTRASOUND HEPATIC ELASTOGRAPHY TECHNIQUE: Sonography of the upper abdomen was performed. In addition, ultrasound elastography evaluation of the liver was performed. A region of interest was placed within the right lobe of the liver. Following application of a compressive sonographic pulse, shear waves were detected in the adjacent hepatic tissue and the shear wave  velocity was calculated. Multiple assessments were performed at the selected site. Median shear wave velocity is correlated to a Metavir fibrosis score. COMPARISON:  10/17/2014 FINDINGS: ULTRASOUND ABDOMEN Gallbladder: Gallbladder has a normal appearance. Gallbladder wall is 1.9 mm, within normal limits. No stones or pericholecystic fluid. No sonographic Murphy's sign. Common bile duct: Diameter: 3.1 mm Liver: Heterogeneous with mild increase in in liver echogenicity. No focal liver lesions. IVC: No abnormality visualized. Pancreas: Visualized portion unremarkable. Spleen: Size and appearance within normal limits.  6.4 cm in length. Right Kidney: Length: 11.2 cm. Echogenicity within normal limits. No mass or hydronephrosis visualized. Left Kidney: Length: 10.6 cm. Echogenicity within normal limits. No mass or hydronephrosis visualized. Abdominal aorta: Atherosclerotic plaque is present.  No aneurysm. Other findings: None. ULTRASOUND HEPATIC ELASTOGRAPHY Device: Siemens Helix VTQ Patient position:  Supine Transducer 6C1 Number of measurements:  10 Hepatic Segment:  8 Median velocity:   2.41  m/sec IQR: 0.53 IQR/Median velocity ratio 0.219 Corresponding Metavir fibrosis score:  Some F3 + F4 Risk of fibrosis: High Limitations of exam: None Pertinent findings noted on other imaging exams:  None Please note that abnormal shear wave velocities may also be identified in clinical settings other than with hepatic fibrosis, such as: acute hepatitis, elevated right heart and central venous pressures including use of beta blockers, veno-occlusive disease (Budd-Chiari), infiltrative processes such as mastocytosis/amyloidosis/infiltrative tumor, extrahepatic cholestasis, in the post-prandial state, and liver transplantation. Correlation with patient history, laboratory data, and clinical condition recommended. IMPRESSION: 1. Mildly heterogeneous liver without focal liver lesions identified. 2. No evidence for acute cholecystitis.  3. Median hepatic shear wave velocity is calculated at 2.41 m/sec. 4. Corresponding Metavir fibrosis score is Some F3 + F4. 5. Risk of fibrosis is high. 6. Follow-up:  Follow-up is advised Electronically Signed   By: Nolon Nations M.D.   On: 06/17/2016 16:00    Impression:  #1. Heterozygote for major hemochromatosis gene complicated by likely pre-existing liver disease since it is unusual to see ferritin levels this high in heterozygotes. She is at risk for progression to cirrhosis and/or cardiomyopathy.  #2. Mild, macrocytic anemia My clinical suspicion is that this is alcohol related although this could be the presentation for a underlying myelodysplastic syndrome which could also result in a defect in iron processing resulting in an elevated ferritin but usually not to this degree.  #3. Unexplained renal insufficiency with creatinine in our office 1.5 on June 20 compared with 1.0 on 09/08/2015 with concomitant normal BUNs both times. She is currently using daily nonsteroidals. Usually just one dose at night.  Recommendation: We again discussed options for lowering her iron stores. The most effective, efficient, and cost effective modality is periodic phlebotomy. We also discussed oral iron chelating agents with the main agent available in this country deferasirox, brand names Exjade and Jadenu (same drug but different formulation). Exjade gets dissolved in water. [place] comes as a pill or as a recently FDA approved "Sprinkel" that can be dusted on food.  This medication can be potentially nephrotoxic. She currently has an unexplained rise in her creatinine. Drug can also cause visual problems and requires complete eye exam including a slit lamp examination at baseline and then yearly.  She is going to go with my recommendation to start with a phlebotomy program. Given her small body habitus and her age I will limit phlebotomy volume to 300 mL per treatment. We will initiate weekly phlebotomy on  Tuesday, December 8 and continue weekly if otherwise tolerated otherwise increase the interval between phlebotomies.   CC: Patient Care Team: Levin Erp, MD as PCP - General   Annia Belt, MD 8/1/20174:58 PM

## 2016-06-29 NOTE — Telephone Encounter (Signed)
Pt called and informed of phlebotomy appt here at [facility] for next Tues 8/8 @ 12noon. Pt instructed to register at Admissions Office 15 mins prior to appt. Pt voiced understanding.

## 2016-06-30 LAB — BASIC METABOLIC PANEL
BUN / CREAT RATIO: 12 (ref 12–28)
BUN: 14 mg/dL (ref 8–27)
CHLORIDE: 103 mmol/L (ref 96–106)
CO2: 18 mmol/L (ref 18–29)
Calcium: 9.4 mg/dL (ref 8.7–10.3)
Creatinine, Ser: 1.15 mg/dL — ABNORMAL HIGH (ref 0.57–1.00)
GFR calc non Af Amer: 48 mL/min/{1.73_m2} — ABNORMAL LOW (ref >59)
GFR, EST AFRICAN AMERICAN: 56 mL/min/{1.73_m2} — AB (ref >59)
GLUCOSE: 86 mg/dL (ref 65–99)
POTASSIUM: 5.3 mmol/L — AB (ref 3.5–5.2)
SODIUM: 144 mmol/L (ref 134–144)

## 2016-07-01 ENCOUNTER — Telehealth: Payer: Self-pay | Admitting: *Deleted

## 2016-07-01 NOTE — Telephone Encounter (Signed)
-----   Message from Levert Feinstein, MD sent at 07/01/2016  2:36 PM EDT ----- Call pt: kidney function better than last time.  A few red cells in urine. I suspect she might have passed a kidney stone.

## 2016-07-01 NOTE — Telephone Encounter (Signed)
Pt called / informed "kidney function better than last time. A few red cells in urine. I suspect she might have passed a kidney stone." per Dr Cyndie Chime. Pt voiced no questions.

## 2016-07-05 ENCOUNTER — Other Ambulatory Visit (HOSPITAL_COMMUNITY): Payer: Self-pay | Admitting: *Deleted

## 2016-07-05 LAB — URINALYSIS, ROUTINE W REFLEX MICROSCOPIC
Bilirubin, UA: NEGATIVE
GLUCOSE, UA: NEGATIVE
KETONES UA: NEGATIVE
LEUKOCYTES UA: NEGATIVE
Nitrite, UA: NEGATIVE
PROTEIN UA: NEGATIVE
RBC, UA: NEGATIVE
SPEC GRAV UA: 1.008 (ref 1.005–1.030)
Urobilinogen, Ur: 0.2 mg/dL (ref 0.2–1.0)
pH, UA: 6.5 (ref 5.0–7.5)

## 2016-07-06 ENCOUNTER — Ambulatory Visit (HOSPITAL_COMMUNITY)
Admission: RE | Admit: 2016-07-06 | Discharge: 2016-07-06 | Disposition: A | Discharge status: Home / Self Care | Payer: Medicare Other | Source: Ambulatory Visit | Attending: Oncology | Admitting: Oncology

## 2016-07-06 LAB — POCT HEMOGLOBIN-HEMACUE: HEMOGLOBIN: 12.5 g/dL (ref 12.0–15.0)

## 2016-07-06 NOTE — Progress Notes (Signed)
PT came in today for scheduled therapeutic phlebotomy.  Her HemoCue was 12.5 prior to procedure.  Right AC was used.  PT tolerated fairly well. Next time we need to get and order for lidocaine.  We will continue to monitor for 15 minutes

## 2016-07-07 ENCOUNTER — Ambulatory Visit (HOSPITAL_COMMUNITY): Payer: Medicare Other

## 2016-07-08 ENCOUNTER — Telehealth: Payer: Self-pay | Admitting: *Deleted

## 2016-07-08 LAB — MICROSCOPIC EXAMINATION
Bacteria, UA: NONE SEEN
Casts: NONE SEEN /lpf

## 2016-07-08 NOTE — Telephone Encounter (Signed)
[facility] calls with corrected result of urine done 8/1 Corrected 8/7 Catherine Harrington is the tech calling Occult blood 3+, there was an error it was redone 8/7 occult blood was negative Called dr g in his office, gave him first result and corrected result, new faxed results placed in his box in [facility]

## 2016-09-03 ENCOUNTER — Other Ambulatory Visit: Payer: Self-pay | Admitting: Internal Medicine

## 2016-09-03 DIAGNOSIS — Z1231 Encounter for screening mammogram for malignant neoplasm of breast: Secondary | ICD-10-CM

## 2016-09-28 ENCOUNTER — Other Ambulatory Visit: Payer: Self-pay | Admitting: Oncology

## 2016-09-28 DIAGNOSIS — D539 Nutritional anemia, unspecified: Secondary | ICD-10-CM

## 2016-09-28 DIAGNOSIS — N183 Chronic kidney disease, stage 3 unspecified: Secondary | ICD-10-CM

## 2016-09-28 DIAGNOSIS — L930 Discoid lupus erythematosus: Secondary | ICD-10-CM

## 2016-09-29 ENCOUNTER — Telehealth (INDEPENDENT_AMBULATORY_CARE_PROVIDER_SITE_OTHER): Payer: Self-pay | Admitting: Physical Medicine and Rehabilitation

## 2016-09-29 NOTE — Telephone Encounter (Signed)
Yes     o/v

## 2016-09-30 NOTE — Telephone Encounter (Signed)
Patient is scheduled for an office visit 10/07/16 at 1030.

## 2016-10-07 ENCOUNTER — Encounter (INDEPENDENT_AMBULATORY_CARE_PROVIDER_SITE_OTHER): Payer: Self-pay | Admitting: Physical Medicine and Rehabilitation

## 2016-10-07 ENCOUNTER — Ambulatory Visit (INDEPENDENT_AMBULATORY_CARE_PROVIDER_SITE_OTHER): Payer: Medicare Other | Admitting: Physical Medicine and Rehabilitation

## 2016-10-07 ENCOUNTER — Telehealth (INDEPENDENT_AMBULATORY_CARE_PROVIDER_SITE_OTHER): Payer: Self-pay | Admitting: Physical Medicine and Rehabilitation

## 2016-10-07 VITALS — BP 128/66 | HR 86

## 2016-10-07 DIAGNOSIS — M545 Low back pain, unspecified: Secondary | ICD-10-CM

## 2016-10-07 DIAGNOSIS — M7061 Trochanteric bursitis, right hip: Secondary | ICD-10-CM

## 2016-10-07 DIAGNOSIS — M4125 Other idiopathic scoliosis, thoracolumbar region: Secondary | ICD-10-CM

## 2016-10-07 DIAGNOSIS — G8929 Other chronic pain: Secondary | ICD-10-CM | POA: Diagnosis not present

## 2016-10-07 DIAGNOSIS — M5416 Radiculopathy, lumbar region: Secondary | ICD-10-CM | POA: Diagnosis not present

## 2016-10-07 MED ORDER — TRIAMCINOLONE ACETONIDE 40 MG/ML IJ SUSP
40.0000 mg | INTRAMUSCULAR | Status: AC | PRN
  Administered 2016-10-07: 40 mg via INTRA_ARTICULAR

## 2016-10-07 MED ORDER — LIDOCAINE HCL 2 % IJ SOLN
4.0000 mL | INTRAMUSCULAR | Status: AC | PRN
  Administered 2016-10-07: 4 mL

## 2016-10-07 NOTE — Progress Notes (Signed)
Catherine Harrington - 70 y.o. female MRN [id number]  Date of birth: 11/26/1946  Office Visit Note: Visit Date: 10/07/2016 PCP: Enrique SackGREEN, EDWIN JAY, MD Referred by: Nila NephewGreen, Edwin, MD  Subjective: Chief Complaint  Patient presents with  . Lower Back - Pain  . Right Hip - Pain   HPI: Catherine Harrington is a 70 year old female that we seen over the last year or so and we seen her husband. She has lumbar scoliosis centered about the L2 region with a lateral listhesis of L3 on 4 and L4 on 5. She does have some lateral recess stenosis between L2 and L3. She's done well with epidural injection for the left side but not really so much on the right in the past. This was an intralaminar injection. She then had an issue with a trigger point in the right upper buttock region which we actually made worse with a trigger point injection but she [facility] and doing dry needling. We haven't seen her in quite some time and she comes in today with several month history of just worsening back pain. This is centered over the iliac crest on the right. There is a focal trigger point there. She ambulates without aid with has a hard time getting around. She says her pain is worse at night. She is getting some pain in the right lateral buttock region and down the lateral posterior lateral leg to about the knee. She denies any numbness tingling or paresthesias. She's had no focal weakness. She gets some relief with change of position and rest all as she can't get comfortable. She does continue to to take tramadol at times was really bad as well as baclofen. She is unable amounts to take diclofenac at all. 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 She is undergoing treatment for that.   Back Pain  Pertinent negatives include no abdominal pain, chest pain, fever, weakness or weight loss.   "Just hurts" Difficulty standing, walking  Pain in lower back, right buttock, down right leg. Right buttock  always hurts. Review of Systems  Constitutional: Negative for chills, fever, malaise/fatigue and weight loss.  HENT: Negative for hearing loss and sinus pain.   Eyes: Negative for blurred vision, double vision and photophobia.  Respiratory: Negative for cough and shortness of breath.   Cardiovascular: Negative for chest pain, palpitations and leg swelling.  Gastrointestinal: Negative for abdominal pain, nausea and vomiting.  Genitourinary: Negative for flank pain.  Musculoskeletal: Positive for back pain. Negative for myalgias.  Skin: Negative for itching and rash.  Neurological: Negative for tremors, focal weakness and weakness.  Endo/Heme/Allergies: Negative.   Psychiatric/Behavioral: Negative for depression and suicidal ideas.  All other systems reviewed and are negative.  Otherwise per HPI.  Assessment & Plan: Visit Diagnoses:  1. Other idiopathic scoliosis, thoracolumbar region   2. Chronic bilateral low back pain without sciatica   3. Greater trochanteric bursitis, right   4. Lumbar radiculopathy     Plan: Findings:  Chronic pain syndrome and chronic worsening low back pain with some pain in the right greater trochanteric/gluteus medial region. This refers down the leg into the lateral aspect of the knee. She is having difficulty and night and with ambulation and with standing. I think her pain unfortunately is multifactorial. She does have a scoliosis with lateral shift and I think she is getting trigger point and tight muscle in this area. She's been to physical therapy continues to stay active. She is getting some radicular-type  complaint today into the knee which I think is more of an L4 distribution. She also does have tenderness over the greater trochanter and lesser trochanter which I think is consistent with part of her pain. We are going to complete a greater trochanteric injection today over the area of greatest pain using fluoroscopic guidance. I'll set her up for right L4  transforaminal injection. She does have an allergy to prednisone which I think we have debated in the past with her but she tells me that she does well with cortisone. Nonetheless we have completed injections with her in the past with Kenalog and Depo-Medrol and she's done fine.    Meds & Orders: No orders of the defined types were placed in this encounter.   Orders Placed This Encounter  Procedures  . Large Joint Injection/Arthrocentesis    Follow-up: Return for schedule right L4 Transforaminal epidural injection.   Procedures: Large Joint Inj Date/Time: 10/07/2016 11:13 AM Performed by: Tyrell AntonioNEWTON, Lucetta Baehr Authorized by: Tyrell AntonioNEWTON, Shraga Custard   Site marked: the procedure site was marked   Timeout: prior to procedure the correct patient, procedure, and site was verified   Indications:  Pain Location:  Hip Site:  R greater trochanter Prep: patient was prepped and draped in usual sterile fashion   Needle Length:  3.5 inches Approach:  Lateral Ultrasound Guidance: No   Fluoroscopic Guidance: Yes   Arthrogram: No Medications:  40 mg triamcinolone acetonide 40 MG/ML; 4 mL lidocaine 2 % Aspiration Attempted: No   Patient tolerance:  Patient tolerated the procedure well with no immediate complications    No notes on file   Clinical History: Lspine MRI 06/11/14 There is moderate lumbar dextroscoliosis with apex at L3. There is left lateral listhesis of L2 on L3 and right lateral listhesis of L3 on L4 and L4 on L5. Bone marrow signal is somewhat heterogeneous diffusely and overall decreased in signal in the lower thoracic spine (possibly reflecting patient's history of anemia) with prominent fatty degenerative marrow changes in the mid and lower lumbar spine. Modic type 1 degenerative marrow changes are also noted in this region. There is lateral recess narrowing at L2-3 on the right.  She reports that she has been smoking Cigarettes.  She has a 48.00 pack-year smoking history. She has  never used smokeless tobacco. No results for input(s): HGBA1C, LABURIC in the last 8760 hours.  Objective:  VS:  HT:    WT:   BMI:     BP:128/66  HR:86bpm  TEMP: ( )  RESP:  Physical Exam  Constitutional: She is oriented to person, place, and time. She appears well-developed and well-nourished.  Eyes: Conjunctivae and EOM are normal. Pupils are equal, round, and reactive to light.  Cardiovascular: Normal rate and intact distal pulses.   Pulmonary/Chest: Effort normal.  Musculoskeletal:  The patient arises with some difficulty from a seated position. She has obvious scoliotic deformity with some rightward shift. She has focal triggerpoints were the iliac crest and quadratus lumborum. She has no real pain over the PSIS although she indicates pain there with a positive Fortin finger sign. She has exquisite tenderness really over the lesser trochanter as opposed to the right greater trochanter on the right side than on the left side. That does reproduce some of her pain. She has good distal strength without clonus.  Neurological: She is alert and oriented to person, place, and time.  Skin: Skin is warm and dry. No erythema.  Psychiatric: She has a normal mood and affect. Her  behavior is normal.  Nursing note and vitals reviewed.   Ortho Exam Imaging: No results found.  Past Medical/Family/Surgical/Social History: Medications & Allergies reviewed per EMR Patient Active Problem List   Diagnosis Date Noted  . Macrocytic anemia 06/29/2016  . Chronic renal insufficiency, stage III (moderate) 06/29/2016  . Acute right flank pain 06/29/2016  . Chronic renal insufficiency 06/29/2016  . Hemochromatosis 05/18/2016  . 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 Macrocytosis 08/16/2013  . Syncope 08/16/2013  . DIVERTICULOSIS, COLON 11/04/2010   Past Medical History:  Diagnosis Date  . Acute right flank pain 06/29/2016  . Arthritis    "related  to the lupus; joints" (08/16/2013)  . Chronic renal insufficiency, stage III (moderate) 06/29/2016  . 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 Family history of anesthesia complication    "daughter doesn't wake up easy from it" (08/16/2013)  . 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 Hypertension   . Iron deficiency anemia   . Lumbar disc disease   . Lupus    "just the rash and arthritis" (08/16/2013)  . Macrocytic anemia 06/29/2016  . 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 Scoliosis   . Syncope and collapse    "lost consciousness ~ 4-5 seconds; didn't hurt myself" (08/16/2013)   Family History  Problem Relation Age of Onset  . Hypertension Mother   . Hypertension Father    Past Surgical History:  Procedure Laterality Date  . CESAREAN SECTION  1967; 1971  . MOUTH SURGERY  2013   " infection under bridge cleaned out, etc" (08/16/2013)  . TUBAL LIGATION     Social History   Occupational History  . retired    Social History Main Topics  . Smoking status: Current Every Day Smoker    Packs/day: 1.00    Years: 48.00    Types: Cigarettes  . Smokeless tobacco: Never Used  . Alcohol use 12.6 oz/week    21 Cans of beer per week     Comment:  3-4 drinks daily sometimes.  . Drug use: No  . Sexual activity: Not Currently    Birth control/ protection: Post-menopausal

## 2016-10-07 NOTE — Telephone Encounter (Signed)
No precert required 

## 2016-10-07 NOTE — Patient Instructions (Signed)

## 2016-10-11 ENCOUNTER — Ambulatory Visit
Admission: RE | Admit: 2016-10-11 | Discharge: 2016-10-11 | Disposition: A | Discharge status: Home / Self Care | Payer: Medicare Other | Source: Ambulatory Visit | Attending: Internal Medicine | Admitting: Internal Medicine

## 2016-10-11 DIAGNOSIS — Z1231 Encounter for screening mammogram for malignant neoplasm of breast: Secondary | ICD-10-CM

## 2016-10-12 ENCOUNTER — Encounter: Payer: Self-pay | Admitting: Oncology

## 2016-10-12 ENCOUNTER — Ambulatory Visit (INDEPENDENT_AMBULATORY_CARE_PROVIDER_SITE_OTHER): Payer: Medicare Other | Admitting: Oncology

## 2016-10-12 ENCOUNTER — Other Ambulatory Visit: Payer: Self-pay | Admitting: *Deleted

## 2016-10-12 DIAGNOSIS — N183 Chronic kidney disease, stage 3 unspecified: Secondary | ICD-10-CM

## 2016-10-12 DIAGNOSIS — K739 Chronic hepatitis, unspecified: Secondary | ICD-10-CM | POA: Diagnosis not present

## 2016-10-12 DIAGNOSIS — R109 Unspecified abdominal pain: Secondary | ICD-10-CM

## 2016-10-12 DIAGNOSIS — Z886 Allergy status to analgesic agent status: Secondary | ICD-10-CM

## 2016-10-12 DIAGNOSIS — F17210 Nicotine dependence, cigarettes, uncomplicated: Secondary | ICD-10-CM

## 2016-10-12 DIAGNOSIS — L930 Discoid lupus erythematosus: Secondary | ICD-10-CM

## 2016-10-12 DIAGNOSIS — Z888 Allergy status to other drugs, medicaments and biological substances status: Secondary | ICD-10-CM

## 2016-10-12 DIAGNOSIS — D539 Nutritional anemia, unspecified: Secondary | ICD-10-CM

## 2016-10-12 DIAGNOSIS — Z882 Allergy status to sulfonamides status: Secondary | ICD-10-CM

## 2016-10-12 DIAGNOSIS — N184 Chronic kidney disease, stage 4 (severe): Secondary | ICD-10-CM

## 2016-10-12 DIAGNOSIS — F1020 Alcohol dependence, uncomplicated: Secondary | ICD-10-CM

## 2016-10-12 DIAGNOSIS — R413 Other amnesia: Secondary | ICD-10-CM

## 2016-10-12 DIAGNOSIS — D7589 Other specified diseases of blood and blood-forming organs: Secondary | ICD-10-CM

## 2016-10-12 NOTE — Progress Notes (Signed)
Hematology and Oncology Follow Up Visit  Catherine Harrington [id number] 25-Apr-1946 70 y.o. 10/12/2016 2:51 PM   Principle Diagnosis: Encounter Diagnoses  Name Primary?  . Hereditary hemochromatosis ([place])   . Chronic renal impairment, stage 3 (moderate) Yes  . Discoid lupus erythematosus   . Macrocytosis   . Chronic renal insufficiency, stage 3 (moderate)   . Acute right flank pain   . Hemochromatosis   . Macrocytic anemia   . Chronic renal insufficiency, stage III (moderate)   Clinical summary: 70 year old woman referred for further evaluation of chronic transaminase elevations with recent findings of increased iron, iron saturation, and ferritin. 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 Interpretation of the ferritin is confounded by an underlying collagen vascular disorder which might lead to chronic inflammation and potentially autoimmune hepatitis, and possible coexisting liver disease from long-standing alcohol use. 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 I repeated a ferritin which confirms that she is in a range that is associated with organ deposition/damage at 1308. Serum iron 188. Percent saturation 82%. Ultrasound of the liver with additional fibro-elastography shows a homogeneous pattern to the liver parenchyma with no focal lesions. No splenomegaly. (No signs of portal hypertension). But there are areas of 3 and 4+ fibrosis. We discussed options to decrease her iron burden. I recommended a trial of phlebotomy but we also discussed potential medical therapy with an iron chelating agent. Attempt at phlebotomy done on 07/06/2016. There was technical difficulty getting a large vein and she experienced significant pain. The procedure was aborted.  Interim History:   She returns today to discuss iron chelation  therapy. She has had no interim medical problems except for a current cold. We discussed the use of deferasirox, brand names Exjade or Jadenu. This is an oral iron chelator taken once a day. Largest population studied were patients with iron overload due to thalassemia. It has found increasing use in patients with sickle cell anemia and myelodysplastic syndrome. I use it as a backup for people who cannot tolerate phlebotomy or who are already anemic. We reviewed the potential side effects and I gave her a printout from up to date. Main concerns are potential for renal or liver dysfunction. GI bleeding has also been seen. Rare auditory and ocular abnormalities with potential for high-frequency hearing loss, and retinal disorders She will need frequent lab monitoring, a baseline slitlamp eye exam and then annual, baseline hearing test then annual. She has reduced renal function so a dose reduction will be made. I will start at the lowest possible dose until we can assess tolerance.  Medications: reviewed  Allergies:  Allergies  Allergen Reactions  . Prednisone Anaphylaxis  . Sulfonamide Derivatives     REACTION: throat swells  . Midol [Ibuprofen]     Review of Systems:  Respiratory ROS:  Currently with an acute bronchitis  Musculoskeletal ROS: Ongoing problems with pain in her spine and hips. She just had a steroid injection for a right  trochanteric bursitis by Dr. Laurence Spates orthopedic surgery on 10/07/2016.  She has a very poor memory. I was contacted by her internist who referred her here. Almost everything I told her at our last visit did not register. Instructions that I gave her today were forgotten before she even left the office. Everything was summarized in writing on her after  visit summary sheet.  Remaining ROS negative:   Physical Exam: Blood pressure (!) 136/57, pulse 88, temperature 97.9 F (36.6 C), temperature source Oral, height '5\' 2"'$  (1.575 m), weight 103 lb (46.7 kg), SpO2  96 %. Wt Readings from Last 3 Encounters:  10/12/16 103 lb (46.7 kg)  07/06/16 104 lb (47.2 kg)  06/29/16 104 lb 14.4 oz (47.6 kg)     General appearance: Frail, petite, Caucasian woman HENNT: Pharynx no erythema, exudate, mass, or ulcer. No thyromegaly or thyroid nodules Lymph nodes: No cervical, supraclavicular, or axillary lymphadenopathy Breasts:  Lungs: Clear to auscultation, resonant to percussion throughout Heart: Regular rhythm, no murmur, no gallop, no rub, no click, no edema Abdomen: Soft, nontender, normal bowel sounds, no mass, no organomegaly Extremities: No edema, no calf tenderness Musculoskeletal: Osteoarthritic joint deformities of her fingers GU:  Vascular: Carotid pulses 2+, no bruits,  Neurologic: Alert, oriented, PERRLA, optic discs sharp and vessels normal, no hemorrhage or exudate, cranial nerves grossly normal, motor strength 5 over 5, reflexes 1+ symmetric, upper body coordination normal, gait normal, Skin: No rash or ecchymosis, thin skin  Lab Results: CBC W/Diff    Component Value Date/Time   WBC 5.1 05/18/2016 1631   WBC 5.7 08/17/2013 0515   RBC 3.23 (L) 05/18/2016 1631   RBC 3.15 (L) 08/17/2013 0515   HGB 12.5 07/06/2016 1210   HCT 35.2 05/18/2016 1631   PLT 182 05/18/2016 1631   MCV 109 (H) 05/18/2016 1631   MCH 36.2 (H) 05/18/2016 1631   MCH 36.2 (H) 08/17/2013 0515   MCHC 33.2 05/18/2016 1631   MCHC 35.3 08/17/2013 0515   RDW 13.4 05/18/2016 1631   LYMPHSABS 1.5 05/18/2016 1631   MONOABS 0.3 08/16/2013 0620   EOSABS 0.0 05/18/2016 1631   BASOSABS 0.0 05/18/2016 1631     Chemistry      Component Value Date/Time   NA 144 06/29/2016 1232   K 5.3 (H) 06/29/2016 1232   CL 103 06/29/2016 1232   CO2 18 06/29/2016 1232   BUN 14 06/29/2016 1232   CREATININE 1.15 (H) 06/29/2016 1232      Component Value Date/Time   CALCIUM 9.4 06/29/2016 1232   ALKPHOS 95 05/18/2016 1631   AST 117 (H) 05/18/2016 1631   ALT 77 (H) 05/18/2016 1631    BILITOT 0.3 05/18/2016 1631       Radiological Studies: No results found.  Impression:  #1. 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 Significant iron overload with areas of grade 3 and 4 fibrosis on ultrasound elastography of the liver. Contributing factors to her iron overload from chronic alcohol use. Risk factors for side effects on this drug primarily related to her chronic renal insufficiency.  Plan: Baseline laboratory today to include CBC, chemistries, ferritin, urinalysis for protein. She will need weekly creatinines 4 then if stable monthly She will need every 2 week liver function tests 1 month then monthly She will need a baseline slitlamp examination of her eyes then yearly Baseline audiometry then yearly She would prefer to get most of her laboratory studies done through her internist's office.  #2. CKD4 I will make a 50% dose reduction in her Jadenu. Based on current weight 47 kg, optimal dose 14 mg/kg per day equals 658 mg. 50% dose reduction down to 330 mg. I will prescribe Jadenu 180 mg 2 tablets daily to begin once we get baseline data above.  #3. Poor short-term memory There may be a contribution from chronic alcohol use. I did not  do formal mental status testing.    CC: Patient Care Team: Levin Erp, MD as PCP - General   Murriel Hopper, MD, [place]  Hematology-Oncology/Internal Medicine  11/14/20172:51 PM

## 2016-10-12 NOTE — Patient Instructions (Addendum)
To lab today Schedule appointment with eye doctor for slit lamp exam We will get insurance authorization for Jadenu then call you when to start You will need to have your kidney function checked once a week for 4 week then monthly, liver function every 2 weeks for one month then every month; urine check for protein once a month Return visit in 6-8 weeks Lab day of return

## 2016-10-12 NOTE — Telephone Encounter (Signed)
Pt only wants to use [facility] [address]

## 2016-10-13 ENCOUNTER — Other Ambulatory Visit (INDEPENDENT_AMBULATORY_CARE_PROVIDER_SITE_OTHER): Payer: Self-pay | Admitting: Physical Medicine and Rehabilitation

## 2016-10-13 LAB — CBC WITH DIFFERENTIAL/PLATELET
BASOS: 0 %
Basophils Absolute: 0 10*3/uL (ref 0.0–0.2)
EOS (ABSOLUTE): 0 10*3/uL (ref 0.0–0.4)
EOS: 1 %
HEMATOCRIT: 36.5 % (ref 34.0–46.6)
HEMOGLOBIN: 12.5 g/dL (ref 11.1–15.9)
IMMATURE GRANS (ABS): 0 10*3/uL (ref 0.0–0.1)
IMMATURE GRANULOCYTES: 0 %
LYMPHS: 16 %
Lymphocytes Absolute: 0.9 10*3/uL (ref 0.7–3.1)
MCH: 35.5 pg — ABNORMAL HIGH (ref 26.6–33.0)
MCHC: 34.2 g/dL (ref 31.5–35.7)
MCV: 104 fL — AB (ref 79–97)
MONOCYTES: 12 %
MONOS ABS: 0.6 10*3/uL (ref 0.1–0.9)
NEUTROS PCT: 71 %
Neutrophils Absolute: 3.7 10*3/uL (ref 1.4–7.0)
Platelets: 196 10*3/uL (ref 150–379)
RBC: 3.52 x10E6/uL — ABNORMAL LOW (ref 3.77–5.28)
RDW: 13.1 % (ref 12.3–15.4)
WBC: 5.2 10*3/uL (ref 3.4–10.8)

## 2016-10-13 LAB — URINALYSIS, ROUTINE W REFLEX MICROSCOPIC
BILIRUBIN UA: NEGATIVE
GLUCOSE, UA: NEGATIVE
Ketones, UA: NEGATIVE
LEUKOCYTES UA: NEGATIVE
Nitrite, UA: NEGATIVE
PROTEIN UA: NEGATIVE
RBC UA: NEGATIVE
Specific Gravity, UA: 1.007 (ref 1.005–1.030)
UUROB: 0.2 mg/dL (ref 0.2–1.0)
pH, UA: 6.5 (ref 5.0–7.5)

## 2016-10-13 LAB — LACTATE DEHYDROGENASE: LDH: 214 IU/L (ref 119–226)

## 2016-10-13 LAB — FERRITIN: FERRITIN: 1160 ng/mL — AB (ref 15–150)

## 2016-10-13 MED ORDER — DIAZEPAM 5 MG PO TABS: 5.0000 mg | ORAL_TABLET | ORAL | 0 refills | Status: AC

## 2016-10-13 NOTE — Progress Notes (Signed)
Patient needed valium pre-procedure

## 2016-10-19 ENCOUNTER — Ambulatory Visit (INDEPENDENT_AMBULATORY_CARE_PROVIDER_SITE_OTHER): Payer: Medicare Other | Admitting: Physical Medicine and Rehabilitation

## 2016-10-19 ENCOUNTER — Encounter (INDEPENDENT_AMBULATORY_CARE_PROVIDER_SITE_OTHER): Payer: Self-pay | Admitting: Physical Medicine and Rehabilitation

## 2016-10-19 VITALS — BP 118/69 | HR 79 | Temp 98.3°F

## 2016-10-19 DIAGNOSIS — M4155 Other secondary scoliosis, thoracolumbar region: Secondary | ICD-10-CM

## 2016-10-19 DIAGNOSIS — M5416 Radiculopathy, lumbar region: Secondary | ICD-10-CM | POA: Diagnosis not present

## 2016-10-19 MED ORDER — LIDOCAINE HCL (PF) 1 % IJ SOLN: 0.3300 mL | Freq: Once | INTRAMUSCULAR | Status: DC

## 2016-10-19 MED ORDER — METHYLPREDNISOLONE ACETATE 80 MG/ML IJ SUSP
80.0000 mg | Freq: Once | INTRAMUSCULAR | Status: AC
  Administered 2016-10-20: 80 mg

## 2016-10-19 NOTE — Patient Instructions (Signed)

## 2016-10-19 NOTE — Progress Notes (Signed)
Leanor KailSusan Penza - 70 y.o. female MRN [id number]  Date of birth: 03/02/1946  Office Visit Note: Visit Date: 10/19/2016 PCP: Enrique SackGREEN, EDWIN JAY, MD Referred by: Nila NephewGreen, Edwin, MD  Subjective: Chief Complaint  Patient presents with  . Lower Back - Pain   HPI: Mrs. Casimiro NeedleMichael is a 70 year old female with significant scoliosis as well as right buttock and leg pain which we feel like his radicular. Greater trochanteric type injections and help somewhat as well.   Patient here today for planned right  L4 transforaminal injection. ROS Otherwise per HPI.  Assessment & Plan: Visit Diagnoses:  1. Lumbar radiculopathy   2. Other secondary scoliosis, thoracolumbar region     Plan: Findings:  I did complete a right L4 transforaminal epidural steroid injection today. Please see our prior evaluation and management note for further details and justification.    Meds & Orders:  Meds ordered this encounter  Medications  . lidocaine (PF) (XYLOCAINE) 1 % injection 0.3 mL  . methylPREDNISolone acetate (DEPO-MEDROL) injection 80 mg    Orders Placed This Encounter  Procedures  . Epidural Steroid injection    Follow-up: Return if symptoms worsen or fail to improve.   Procedures: No procedures performed  Lumbosacral Transforaminal Epidural Steroid Injection - Infraneural Approach with Fluoroscopic Guidance  Patient: Leanor KailSusan Stockdale      Date of Birth: 01/03/1946 MRN: [id number] PCP: Enrique SackGREEN, EDWIN JAY, MD      Visit Date: 10/19/2016   Universal Protocol:    Date/Time: 11/22/175:51 AM  Consent Given By: the patient  Position: PRONE   Additional Comments: Vital signs were monitored before and after the procedure. Patient was prepped and draped in the usual sterile fashion. The correct patient, procedure, and site was verified.   Injection Procedure Details:  Procedure Site One Meds Administered:  Meds ordered this encounter  Medications  . lidocaine (PF) (XYLOCAINE) 1 % injection 0.3 mL  .  methylPREDNISolone acetate (DEPO-MEDROL) injection 80 mg      Laterality: Right  Location/Site:  L4-L5  Needle size: 22 G  Needle type: Spinal  Needle Placement: Transforaminal  Findings:  -Contrast Used: 1 mL iohexol 180 mg iodine/mL   -Comments: Excellent flow of contrast along the nerve and into the epidural space.  Procedure Details: After squaring off the end-plates of the desired vertebral level to get a true AP view, the C-arm was obliqued to the painful side so that the superior articulating process is positioned about 1/3 the length of the inferior endplate.  The needle was aimed toward the junction of the superior articular process and the transverse process of the inferior vertebrae. The needle's initial entry is in the lower third of the foramen through Kambin's triangle. The soft tissues overlying this target were infiltrated with 2-3 ml. of 1% Lidocaine without Epinephrine.  The spinal needle was then inserted and advanced toward the target using a "trajectory" view along the fluoroscope beam.  Under AP and lateral visualization, the needle was advanced so it did not puncture dura and did not traverse medially beyond the 6 o'clock position of the pedicle. Bi-planar projections were used to confirm position. Aspiration was confirmed to be negative for CSF and/or blood. A 1-2 ml. volume of Isovue-250 was injected and flow of contrast was noted at each level. Radiographs were obtained for documentation purposes.   After attaining the desired flow of contrast documented above, a 0.5 to 1.0 ml test dose of 0.25% Marcaine was injected into each respective transforaminal space.  The patient  was observed for 90 seconds post injection.  After no sensory deficits were reported, and normal lower extremity motor function was noted,   the above injectate was administered so that equal amounts of the injectate were placed at each foramen (level) into the transforaminal epidural  space.   Additional Comments:  The patient tolerated the procedure well Dressing: Band-Aid    Post-procedure details: Patient was observed during the procedure. Post-procedure instructions were reviewed.  Patient left the clinic in stable condition.     Clinical History: Lspine MRI 06/11/14 There is moderate lumbar dextroscoliosis with apex at L3. There is left lateral listhesis of L2 on L3 and right lateral listhesis of L3 on L4 and L4 on L5. Bone marrow signal is somewhat heterogeneous diffusely and overall decreased in signal in the lower thoracic spine (possibly reflecting patient's history of anemia) with prominent fatty degenerative marrow changes in the mid and lower lumbar spine. Modic type 1 degenerative marrow changes are also noted in this region. There is lateral recess narrowing at L2-3 on the right.  She reports that she has been smoking Cigarettes.  She has a 48.00 pack-year smoking history. She has never used smokeless tobacco. No results for input(s): HGBA1C, LABURIC in the last 8760 hours.  Objective:  VS:  HT:    WT:   BMI:     BP:118/69  HR:79bpm  TEMP:98.3 F (36.8 C)( )  RESP:94 % Physical Exam  Ortho Exam Imaging: No results found.  Past Medical/Family/Surgical/Social History: Medications & Allergies reviewed per EMR Patient Active Problem List   Diagnosis Date Noted  . Macrocytic anemia 06/29/2016  . Chronic renal insufficiency, stage III (moderate) 06/29/2016  . Acute right flank pain 06/29/2016  . Chronic renal insufficiency 06/29/2016  . Hemochromatosis 05/18/2016  . 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 Macrocytosis 08/16/2013  . Syncope 08/16/2013  . DIVERTICULOSIS, COLON 11/04/2010   Past Medical History:  Diagnosis Date  . Acute right flank pain 06/29/2016  . Arthritis    "related to the lupus; joints" (08/16/2013)  . Chronic renal insufficiency, stage III (moderate) 06/29/2016   . 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 Family history of anesthesia complication    "daughter doesn't wake up easy from it" (08/16/2013)  . 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 Hypertension   . Iron deficiency anemia   . Lumbar disc disease   . Lupus    "just the rash and arthritis" (08/16/2013)  . Macrocytic anemia 06/29/2016  . 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 Scoliosis   . Syncope and collapse    "lost consciousness ~ 4-5 seconds; didn't hurt myself" (08/16/2013)   Family History  Problem Relation Age of Onset  . Hypertension Mother   . Hypertension Father    Past Surgical History:  Procedure Laterality Date  . CESAREAN SECTION  1967; 1971  . MOUTH SURGERY  2013   " infection under bridge cleaned out, etc" (08/16/2013)  . TUBAL LIGATION     Social History   Occupational History  . retired    Social History Main Topics  . Smoking status: Current Every Day Smoker    Packs/day: 1.00    Years: 48.00    Types: Cigarettes  . Smokeless tobacco: Never Used  . Alcohol use 12.6 oz/week    21 Cans of beer per week     Comment:  3-4 drinks daily sometimes.  . Drug use: No  . Sexual activity: Not  Currently    Birth control/ protection: Post-menopausal

## 2016-10-20 DIAGNOSIS — M5416 Radiculopathy, lumbar region: Secondary | ICD-10-CM | POA: Diagnosis not present

## 2016-10-20 DIAGNOSIS — M4155 Other secondary scoliosis, thoracolumbar region: Secondary | ICD-10-CM

## 2016-10-20 NOTE — Procedures (Signed)
Lumbosacral Transforaminal Epidural Steroid Injection - Infraneural Approach with Fluoroscopic Guidance  Patient: Catherine Harrington      Date of Birth: 02/23/1946 MRN: [id number] PCP: Enrique SackGREEN, EDWIN JAY, MD      Visit Date: 10/19/2016   Universal Protocol:    Date/Time: 11/22/175:51 AM  Consent Given By: the patient  Position: PRONE   Additional Comments: Vital signs were monitored before and after the procedure. Patient was prepped and draped in the usual sterile fashion. The correct patient, procedure, and site was verified.   Injection Procedure Details:  Procedure Site One Meds Administered:  Meds ordered this encounter  Medications  . lidocaine (PF) (XYLOCAINE) 1 % injection 0.3 mL  . methylPREDNISolone acetate (DEPO-MEDROL) injection 80 mg      Laterality: Right  Location/Site:  L4-L5  Needle size: 22 G  Needle type: Spinal  Needle Placement: Transforaminal  Findings:  -Contrast Used: 1 mL iohexol 180 mg iodine/mL   -Comments: Excellent flow of contrast along the nerve and into the epidural space.  Procedure Details: After squaring off the end-plates of the desired vertebral level to get a true AP view, the C-arm was obliqued to the painful side so that the superior articulating process is positioned about 1/3 the length of the inferior endplate.  The needle was aimed toward the junction of the superior articular process and the transverse process of the inferior vertebrae. The needle's initial entry is in the lower third of the foramen through Kambin's triangle. The soft tissues overlying this target were infiltrated with 2-3 ml. of 1% Lidocaine without Epinephrine.  The spinal needle was then inserted and advanced toward the target using a "trajectory" view along the fluoroscope beam.  Under AP and lateral visualization, the needle was advanced so it did not puncture dura and did not traverse medially beyond the 6 o'clock position of the pedicle. Bi-planar projections  were used to confirm position. Aspiration was confirmed to be negative for CSF and/or blood. A 1-2 ml. volume of Isovue-250 was injected and flow of contrast was noted at each level. Radiographs were obtained for documentation purposes.   After attaining the desired flow of contrast documented above, a 0.5 to 1.0 ml test dose of 0.25% Marcaine was injected into each respective transforaminal space.  The patient was observed for 90 seconds post injection.  After no sensory deficits were reported, and normal lower extremity motor function was noted,   the above injectate was administered so that equal amounts of the injectate were placed at each foramen (level) into the transforaminal epidural space.   Additional Comments:  The patient tolerated the procedure well Dressing: Band-Aid    Post-procedure details: Patient was observed during the procedure. Post-procedure instructions were reviewed.  Patient left the clinic in stable condition.

## 2016-11-11 ENCOUNTER — Telehealth: Payer: Self-pay | Admitting: *Deleted

## 2016-11-11 ENCOUNTER — Telehealth (INDEPENDENT_AMBULATORY_CARE_PROVIDER_SITE_OTHER): Payer: Self-pay | Admitting: Physical Medicine and Rehabilitation

## 2016-11-11 ENCOUNTER — Other Ambulatory Visit: Payer: Self-pay | Admitting: Oncology

## 2016-11-11 MED ORDER — DEFERASIROX 360 MG PO TABS: 360.0000 mg | ORAL_TABLET | Freq: Every day | ORAL | 3 refills | Status: AC

## 2016-11-11 NOTE — Telephone Encounter (Signed)
Rx submitted Thanks DrG

## 2016-11-11 NOTE — Telephone Encounter (Signed)
Please ask her if she had that fitted beforehand and where.

## 2016-11-11 NOTE — Telephone Encounter (Signed)
Pt called to state she never received rx for Jadenu.  I contacted pt's insurance to see this medication would require a prior authorization and it does not.  Medication is covered under a Tier 5 but will have to be dispensed from a specialty pharmacy.  I contacted [facility] at [phone] after being informed that the local [facility] was unable to supply medication to patient.  [facility], all I have to do is submit Jadenu rx to the local [facility] which will generate some type of response that will send it to the "specialty side".  I will have prescribing MD send rx electronically.Catherine Harrington.[name]12/14/20171:30 PM     Bin: [phone] Grp: [id number] PCN: [id number] PT ID# [id number]

## 2016-11-12 ENCOUNTER — Telehealth: Payer: Self-pay | Admitting: *Deleted

## 2016-11-12 NOTE — Telephone Encounter (Addendum)
I called and talked to pt about scheduling lab appts - stated her ENT appt is 12/18.

## 2016-11-12 NOTE — Telephone Encounter (Signed)
-----   Message from Levert FeinsteinJames M Granfortuna, MD sent at 11/11/2016  3:35 PM EST ----- Rivka BarbaraGlenda - please touch base with patient. She is going to get a hearing test then start Jadenu. We need to check lab every 2 weeks starting 2 weeks after the first dose for the first month then every month. [facility] then we can work out something with her primary care if that is more convenient. Thanks DrG

## 2016-11-18 NOTE — Telephone Encounter (Signed)
Called pt to f/u scheduling lab appt - pt stated she has not start taking Jadenu. Said she might not start taking it b/c [facility]. And she has a call to [employer] (mfg co) to see if they cn provide it at a cheaper price.

## 2016-11-30 NOTE — Telephone Encounter (Signed)
Noted - see if one of our pharmacists can intervene with our drug rep  [employer] help # is [phone].

## 2016-12-02 NOTE — Telephone Encounter (Signed)
Patient states the price quote was prior to new year and she plans to do another price check tomorrow. Patient states she also found a discount program for free first 3 months and will pursue if needed. Gave her my number if needed to call back for further help.

## 2016-12-02 NOTE — Telephone Encounter (Signed)
noted 

## 2016-12-07 ENCOUNTER — Telehealth: Payer: Self-pay | Admitting: *Deleted

## 2016-12-07 NOTE — Telephone Encounter (Signed)
Pt called / stated still cannot afford Jadenu; called [facility] again, stated price is $5000.00 for 30 pills. Transferred call to Amada KingfisherJen Kim to see if she help pt.

## 2016-12-09 ENCOUNTER — Other Ambulatory Visit (INDEPENDENT_AMBULATORY_CARE_PROVIDER_SITE_OTHER): Payer: Self-pay | Admitting: Physical Medicine and Rehabilitation

## 2016-12-20 ENCOUNTER — Ambulatory Visit (INDEPENDENT_AMBULATORY_CARE_PROVIDER_SITE_OTHER): Payer: Medicare Other | Admitting: Oncology

## 2016-12-20 ENCOUNTER — Encounter: Payer: Self-pay | Admitting: Oncology

## 2016-12-20 DIAGNOSIS — F17210 Nicotine dependence, cigarettes, uncomplicated: Secondary | ICD-10-CM | POA: Diagnosis not present

## 2016-12-20 DIAGNOSIS — D539 Nutritional anemia, unspecified: Secondary | ICD-10-CM

## 2016-12-20 DIAGNOSIS — Z888 Allergy status to other drugs, medicaments and biological substances status: Secondary | ICD-10-CM | POA: Diagnosis not present

## 2016-12-20 DIAGNOSIS — F1021 Alcohol dependence, in remission: Secondary | ICD-10-CM

## 2016-12-20 DIAGNOSIS — Z886 Allergy status to analgesic agent status: Secondary | ICD-10-CM | POA: Diagnosis not present

## 2016-12-20 DIAGNOSIS — L930 Discoid lupus erythematosus: Secondary | ICD-10-CM | POA: Diagnosis not present

## 2016-12-20 DIAGNOSIS — N183 Chronic kidney disease, stage 3 unspecified: Secondary | ICD-10-CM

## 2016-12-20 DIAGNOSIS — Z882 Allergy status to sulfonamides status: Secondary | ICD-10-CM

## 2016-12-20 DIAGNOSIS — D7589 Other specified diseases of blood and blood-forming organs: Secondary | ICD-10-CM

## 2016-12-20 MED ORDER — LORAZEPAM 1 MG PO TABS: 1.0000 mg | ORAL_TABLET | Freq: Three times a day (TID) | ORAL | 2 refills | Status: DC | PRN

## 2016-12-20 NOTE — Patient Instructions (Addendum)
[facility] 12 PM this Wednesday 1/24; repeat every 2 weeks initially then Take Ativan (lorazepam) 1-2 one mg tabs 1 hour before procedure Return visit with Dr Reece AgarG 6-8 weeks

## 2016-12-21 ENCOUNTER — Other Ambulatory Visit: Payer: Self-pay | Admitting: Oncology

## 2016-12-21 LAB — COMPREHENSIVE METABOLIC PANEL
A/G RATIO: 1.6 (ref 1.2–2.2)
ALK PHOS: 83 IU/L (ref 39–117)
ALT: 67 IU/L — ABNORMAL HIGH (ref 0–32)
AST: 97 IU/L — ABNORMAL HIGH (ref 0–40)
Albumin: 4.3 g/dL (ref 3.5–4.8)
BUN/Creatinine Ratio: 14 (ref 12–28)
BUN: 13 mg/dL (ref 8–27)
Bilirubin Total: 0.6 mg/dL (ref 0.0–1.2)
CO2: 22 mmol/L (ref 18–29)
CREATININE: 0.95 mg/dL (ref 0.57–1.00)
Calcium: 9.4 mg/dL (ref 8.7–10.3)
Chloride: 103 mmol/L (ref 96–106)
GFR calc Af Amer: 70 mL/min/{1.73_m2} (ref >59)
GFR calc non Af Amer: 60 mL/min/{1.73_m2} (ref >59)
GLOBULIN, TOTAL: 2.7 g/dL (ref 1.5–4.5)
Glucose: 93 mg/dL (ref 65–99)
POTASSIUM: 4.7 mmol/L (ref 3.5–5.2)
SODIUM: 142 mmol/L (ref 134–144)
Total Protein: 7 g/dL (ref 6.0–8.5)

## 2016-12-21 LAB — CBC WITH DIFFERENTIAL/PLATELET
BASOS: 0 %
Basophils Absolute: 0 10*3/uL (ref 0.0–0.2)
EOS (ABSOLUTE): 0 10*3/uL (ref 0.0–0.4)
EOS: 1 %
HEMATOCRIT: 37.2 % (ref 34.0–46.6)
HEMOGLOBIN: 12.2 g/dL (ref 11.1–15.9)
Immature Grans (Abs): 0 10*3/uL (ref 0.0–0.1)
Immature Granulocytes: 0 %
LYMPHS ABS: 1.4 10*3/uL (ref 0.7–3.1)
Lymphs: 38 %
MCH: 35.4 pg — AB (ref 26.6–33.0)
MCHC: 32.8 g/dL (ref 31.5–35.7)
MCV: 108 fL — AB (ref 79–97)
MONOCYTES: 11 %
MONOS ABS: 0.4 10*3/uL (ref 0.1–0.9)
NEUTROS ABS: 1.8 10*3/uL (ref 1.4–7.0)
Neutrophils: 50 %
Platelets: 160 10*3/uL (ref 150–379)
RBC: 3.45 x10E6/uL — AB (ref 3.77–5.28)
RDW: 13.8 % (ref 12.3–15.4)
WBC: 3.6 10*3/uL (ref 3.4–10.8)

## 2016-12-21 LAB — FERRITIN: Ferritin: 1175 ng/mL — ABNORMAL HIGH (ref 15–150)

## 2016-12-21 NOTE — Progress Notes (Signed)
Hematology and Oncology Follow Up Visit  Catherine Harrington [id number] 24-Oct-1946 71 y.o. 12/21/2016 3:50 PM   Principle Diagnosis: Encounter Diagnoses  Name Primary?  . Hereditary hemochromatosis ([place]) Yes  . Macrocytosis   . Discoid lupus erythematosus   . Macrocytic anemia   . Chronic renal insufficiency, stage III (moderate)   Clinical summary: 71 year old woman referred for further evaluation of chronic transaminase elevations with recent findings of increased iron, iron saturation, and ferritin. 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 Interpretation of the ferritin is confounded by an underlying collagen vascular disorder which might lead to chronic inflammation and potentially autoimmune hepatitis, and possible coexisting liver disease from long-standing alcohol use. 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 I repeated a ferritin which confirms that she is in a range that is associated with organ deposition/damage at 1308. Serum iron 188. Percent saturation 82%. Ultrasound of the liver with additional fibro-elastography shows a homogeneous pattern to the liver parenchyma with no focal lesions. No splenomegaly. (No signs of portal hypertension). But there are areas of 3 and 4+ fibrosis. We discussed options to decrease her iron burden. I recommended a trial of phlebotomy but we also discussed potential medical therapy with an iron chelating agent. Attempt at phlebotomy done on 07/06/2016. There was technical difficulty getting a large vein and she experienced significant pain. The procedure was aborted.   Interim History:   Patient comes in with her husband today for further discussion on management of her iron overload syndrome. This appears to be multifactorial with contributions from discoid lupus (although he S  are currently normal), chronic alcohol use in the past, and heterozygote status for the C282Y hemachromatosis gene. Ferritin levels over 1000. Chronic transaminase elevations 2 times normal, abdominal ultrasound with hepatic elastography technique showing no focal liver lesions but areas of grade 3 and grade 4 fibrosis. Normal-sized spleen.  As summarized above, ttempt at phlebotomy resulted in extreme pain upon needle placement in the brachiocephalic vein. We explored alternative treatment with oral iron chelation therapy with deferasirox (Jadenu). She did get a formal ophthalmologic exam and hearing exam as baseline. 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 Unfortunately, the price of this drug is prohibitive, almost $2000 a month. We looked into patient assistance through the company and they were willing to give a 3 month trial of the drug. However, after further discussion this morning, and reassurance that we might be able to take some prophylactic measures to make phlebotomy go more smoothly, she would rather try this again.  She has had no interim medical problems.  Medications: reviewed  Allergies:  Allergies  Allergen Reactions  . Prednisone Anaphylaxis  . Sulfonamide Derivatives     REACTION: throat swells  . Midol [Ibuprofen]     Review of Systems: See interim history Remaining ROS negative:   Physical Exam: Blood pressure (!) 128/58, pulse 78, temperature 98 F (36.7 C), temperature source Oral, height 5' 2"  (1.575 m), weight 101 lb 9.6 oz (46.1 kg), SpO2 97 %. Wt Readings from Last 3 Encounters:  12/20/16 101 lb 9.6 oz (46.1 kg)  10/12/16 103 lb (46.7 kg)  07/06/16 104 lb (47.2 kg)     General appearance: Thin Caucasian woman HENNT: Pharynx no erythema, exudate, mass, or ulcer. No thyromegaly  or thyroid nodules Lymph nodes: No cervical, supraclavicular, or axillary lymphadenopathy Breasts:  Lungs: Clear to auscultation,  resonant to percussion throughout Heart: Regular rhythm, no murmur, no gallop, no rub, no click, no edema Abdomen: Soft, nontender, normal bowel sounds, no mass, no organomegaly Extremities: No edema, no calf tenderness Musculoskeletal: no joint deformities GU:  Vascular: Carotid pulses 2+, no bruits,  Neurologic: Alert, oriented, PERRLA,  cranial nerves grossly normal, motor strength 5 over 5, reflexes 1+ symmetric, upper body coordination normal, gait normal, Skin: No rash or ecchymosis  Lab Results: CBC W/Diff    Component Value Date/Time   WBC 3.6 12/20/2016 1127   WBC 5.7 08/17/2013 0515   RBC 3.45 (L) 12/20/2016 1127   RBC 3.15 (L) 08/17/2013 0515   HGB 12.5 07/06/2016 1210   HCT 37.2 12/20/2016 1127   PLT 160 12/20/2016 1127   MCV 108 (H) 12/20/2016 1127   MCH 35.4 (H) 12/20/2016 1127   MCH 36.2 (H) 08/17/2013 0515   MCHC 32.8 12/20/2016 1127   MCHC 35.3 08/17/2013 0515   RDW 13.8 12/20/2016 1127   LYMPHSABS 1.4 12/20/2016 1127   MONOABS 0.3 08/16/2013 0620   EOSABS 0.0 12/20/2016 1127   BASOSABS 0.0 12/20/2016 1127     Chemistry      Component Value Date/Time   NA 142 12/20/2016 1127   K 4.7 12/20/2016 1127   CL 103 12/20/2016 1127   CO2 22 12/20/2016 1127   BUN 13 12/20/2016 1127   CREATININE 0.95 12/20/2016 1127      Component Value Date/Time   CALCIUM 9.4 12/20/2016 1127   ALKPHOS 83 12/20/2016 1127   AST 97 (H) 12/20/2016 1127   ALT 67 (H) 12/20/2016 1127   BILITOT 0.6 12/20/2016 1127       Radiological Studies: No results found.  Impression:  #1. Complex iron overload syndrome Plan is to attempt phlebotomy again. I'm going to get her set up at the [facility] short stay unit where phlebotomies done more frequently so that she gets a experienced phlebotomist. Take the precaution of pre-medication with 1-2 milligrams of lorazepam one hour prior to the procedure and at time of procedure, inject 1% lidocaine intradermal with a small needle to minimize  the discomfort of the large phlebotomy needle. I'm going to just do a trial of 300 mL phlebotomies given her small stature and coexisting mild anemia. If everything goes smoothly, I will do a 300 mL phlebotomy every 2 weeks for 6-8 weeks then reevaluate. We may still have to fall back on an oral iron chelator.  CC: Patient Care Team: Levin Erp, MD as PCP - General   Murriel Hopper, MD, FACP  Hematology-Oncology/Internal Medicine  1/23/20183:50 PM

## 2016-12-22 ENCOUNTER — Encounter (HOSPITAL_COMMUNITY): Payer: Self-pay

## 2016-12-22 ENCOUNTER — Telehealth: Payer: Self-pay | Admitting: *Deleted

## 2016-12-22 ENCOUNTER — Ambulatory Visit (HOSPITAL_COMMUNITY)
Admission: RE | Admit: 2016-12-22 | Discharge: 2016-12-22 | Disposition: A | Discharge status: Home / Self Care | Payer: Medicare Other | Source: Ambulatory Visit | Attending: Oncology | Admitting: Oncology

## 2016-12-22 DIAGNOSIS — D7589 Other specified diseases of blood and blood-forming organs: Secondary | ICD-10-CM | POA: Diagnosis present

## 2016-12-22 DIAGNOSIS — L930 Discoid lupus erythematosus: Secondary | ICD-10-CM | POA: Insufficient documentation

## 2016-12-22 DIAGNOSIS — D539 Nutritional anemia, unspecified: Secondary | ICD-10-CM | POA: Insufficient documentation

## 2016-12-22 DIAGNOSIS — N183 Chronic kidney disease, stage 3 (moderate): Secondary | ICD-10-CM | POA: Insufficient documentation

## 2016-12-22 NOTE — Discharge Instructions (Signed)
     Therapeutic Phlebotomy, Care After Refer to this sheet in the next few weeks. These instructions provide you with information about caring for yourself after your procedure. Your health care provider may also give you more specific instructions. Your treatment has been planned according to current medical practices, but problems sometimes occur. Call your health care provider if you have any problems or questions after your procedure. What can I expect after the procedure? After the procedure, it is common to have:  Light-headedness or dizziness. You may feel faint.  Nausea.  Tiredness. Follow these instructions at home: Activity  Return to your normal activities as directed by your health care provider. Most people can go back to their normal activities right away.  Avoid strenuous physical activity and heavy lifting or pulling for about 5 hours after the procedure. Do not lift anything that is heavier than 10 lb (4.5 kg).  Athletes should avoid strenuous exercise for at least 12 hours.  Change positions slowly for the remainder of the day. This will help to prevent light-headedness or fainting.  If you feel light-headed, lie down until the feeling goes away. Eating and drinking  Be sure to eat well-balanced meals for the next 24 hours.  Drink enough fluid to keep your urine clear or pale yellow.  Avoid drinking alcohol on the day that you had the procedure. Care of the Needle Insertion Site  Keep your bandage dry. You can remove the bandage after about 5 hours or as directed by your health care provider.  If you have bleeding from the needle insertion site, elevate your arm and press firmly on the site until the bleeding stops.  If you have bruising at the site, apply ice to the area:  Put ice in a plastic bag.  Place a towel between your skin and the bag.  Leave the ice on for 20 minutes, 2-3 times a day for the first 24 hours.  If the swelling does not go away  after 24 hours, apply a warm, moist washcloth to the area for 20 minutes, 2-3 times a day. General instructions  Avoid smoking for at least 30 minutes after the procedure.  Keep all follow-up visits as directed by your health care provider. It is important to continue with further therapeutic phlebotomy treatments as directed. Contact a health care provider if:  You have redness, swelling, or pain at the needle insertion site.  You have fluid, blood, or pus coming from the needle insertion site.  You feel light-headed, dizzy, or nauseated, and the feeling does not go away.  You notice new bruising at the needle insertion site.  You feel weaker than normal.  You have a fever or chills. Get help right away if:  You have severe nausea or vomiting.  You have chest pain.  You have trouble breathing. This information is not intended to replace advice given to you by your health care provider. Make sure you discuss any questions you have with your health care provider. Document Released: 04/19/2011 Document Revised: 07/17/2016 Document Reviewed: 11/11/2014 Elsevier Interactive Patient Education  2017 Elsevier Inc.  

## 2016-12-22 NOTE — Telephone Encounter (Signed)
Returned pt's call - left message about she wanted to talk about her "blood treatment" today. Husband stated pt was taking a nap and will have her to call back.

## 2016-12-22 NOTE — Progress Notes (Signed)
Catherine KailSusan Krigbaum presents today for phlebotomy per MD orders. Hgb/Hct on 12/20/16 was 12.2/37.2 Phlebotomy procedure started at 1215 and ended at 1220 PM  300 cc removed. Patient tolerated procedure well. 120 ml of orange juice PO. Vitals signs are stable. IV needle removed intact.

## 2016-12-22 NOTE — Telephone Encounter (Signed)
Patient called stating she was having very severe leg cramps and asked if it could be the medicine Dr. Cyndie ChimeGranfortuna prescribed.  I spoke with Dr. Cyndie ChimeGranfortuna and he asked me to tell the patient he only prescribed Ativan and that would help relieve cramps.  If she is still having cramps in the AM to please call her Primary Care Provider. Patient and Husband acknowledge understanding.  Gentry Fitzoris Kelden [facility] Practice Administrator 12/22/16 - 5:54 PM

## 2017-01-17 ENCOUNTER — Telehealth (INDEPENDENT_AMBULATORY_CARE_PROVIDER_SITE_OTHER): Payer: Self-pay | Admitting: Physical Medicine and Rehabilitation

## 2017-01-17 NOTE — Telephone Encounter (Signed)
Patient requested not to be called Tuesday. I told her I would call her back Wednesday 2/21 to let her know.

## 2017-01-17 NOTE — Telephone Encounter (Signed)
  [facility] No reviews  Massage Therapist [address]  [phone] Open  Closes 6pm  Or [facility] on [address] across from us.    Insurance no coverage so she does not need actual referral.

## 2017-01-18 ENCOUNTER — Ambulatory Visit (HOSPITAL_COMMUNITY)
Admission: RE | Admit: 2017-01-18 | Discharge: 2017-01-18 | Disposition: A | Discharge status: Home / Self Care | Payer: Medicare Other | Source: Ambulatory Visit | Attending: Oncology | Admitting: Oncology

## 2017-01-18 ENCOUNTER — Emergency Department (HOSPITAL_COMMUNITY)
Admission: EM | Admit: 2017-01-18 | Discharge: 2017-01-18 | Disposition: A | Discharge status: Home / Self Care | Payer: Medicare Other

## 2017-01-18 ENCOUNTER — Encounter (HOSPITAL_COMMUNITY): Payer: Self-pay

## 2017-01-18 LAB — HEMOGLOBIN AND HEMATOCRIT, BLOOD
HEMATOCRIT: 34 % — AB (ref 36.0–46.0)
Hemoglobin: 11.5 g/dL — ABNORMAL LOW (ref 12.0–15.0)

## 2017-01-18 NOTE — Progress Notes (Signed)
Catherine KailSusan Harrington presents today for phlebotomy per MD orders. HGB: 11.5 Phlebotomy procedure started at 1100 and ended at 1125. 300 cc removed. Ate crackers, cheese, and had a coke. Ambulated in room post procedure. VSS.  Patient tolerated procedure well. IV needle removed intact. Home with husband. Escorted to car via w/c.

## 2017-01-18 NOTE — Discharge Instructions (Signed)
°  Call Md for any problems or questions   Therapeutic Phlebotomy Therapeutic phlebotomy is the controlled removal of blood from a person's body for the purpose of treating a medical condition. The procedure is similar to donating blood. Usually, about a pint (470 mL, or 0.47L) of blood is removed. The average adult has 9-12 pints (4.3-5.7 L) of blood. Therapeutic phlebotomy may be used to treat the following medical conditions:  Hemochromatosis. This is a condition in which the blood contains too much iron.  Polycythemia vera. This is a condition in which the blood contains too many red blood cells.  Porphyria cutanea tarda. This is a disease in which an important part of hemoglobin is not made properly. It results in the buildup of abnormal amounts of porphyrins in the body.  Sickle cell disease. This is a condition in which the red blood cells form an abnormal crescent shape rather than a round shape. Tell a health care provider about:  Any allergies you have.  All medicines you are taking, including vitamins, herbs, eye drops, creams, and over-the-counter medicines.  Any problems you or family members have had with anesthetic medicines.  Any blood disorders you have.  Any surgeries you have had.  Any medical conditions you have. What are the risks? Generally, this is a safe procedure. However, problems may occur, including:  Nausea or light-headedness.  Low blood pressure.  Soreness, bleeding, swelling, or bruising at the needle insertion site.  Infection. What happens before the procedure?  Follow instructions from your health care provider about eating or drinking restrictions.  Ask your health care provider about changing or stopping your regular medicines. This is especially important if you are taking diabetes medicines or blood thinners.  Wear clothing with sleeves that can be raised above the elbow.  Plan to have someone take you home after the procedure.  You  may have a blood sample taken. What happens during the procedure?  A needle will be inserted into one of your veins.  Tubing and a collection bag will be attached to that needle.  Blood will flow through the needle and tubing into the collection bag.  You may be asked to open and close your hand slowly and continually during the entire collection.  After the specified amount of blood has been removed from your body, the collection bag and tubing will be clamped.  The needle will be removed from your vein.  Pressure will be held on the site of the needle insertion to stop the bleeding.  A bandage (dressing) will be placed over the needle insertion site. The procedure may vary among health care providers and hospitals. What happens after the procedure?  Your recovery will be assessed and monitored.  You can return to your normal activities as directed by your health care provider. This information is not intended to replace advice given to you by your health care provider. Make sure you discuss any questions you have with your health care provider. Document Released: 04/19/2011 Document Revised: 07/17/2016 Document Reviewed: 11/11/2014 Elsevier Interactive Patient Education  2017 [employer]

## 2017-01-19 NOTE — Telephone Encounter (Signed)
Called patient and gave her the names.

## 2017-02-07 ENCOUNTER — Ambulatory Visit (INDEPENDENT_AMBULATORY_CARE_PROVIDER_SITE_OTHER): Payer: Medicare Other | Admitting: Oncology

## 2017-02-07 ENCOUNTER — Encounter: Payer: Self-pay | Admitting: Oncology

## 2017-02-07 DIAGNOSIS — Z888 Allergy status to other drugs, medicaments and biological substances status: Secondary | ICD-10-CM | POA: Diagnosis not present

## 2017-02-07 DIAGNOSIS — R413 Other amnesia: Secondary | ICD-10-CM | POA: Diagnosis not present

## 2017-02-07 DIAGNOSIS — F1020 Alcohol dependence, uncomplicated: Secondary | ICD-10-CM | POA: Diagnosis not present

## 2017-02-07 DIAGNOSIS — D539 Nutritional anemia, unspecified: Secondary | ICD-10-CM

## 2017-02-07 DIAGNOSIS — F17210 Nicotine dependence, cigarettes, uncomplicated: Secondary | ICD-10-CM | POA: Diagnosis not present

## 2017-02-07 DIAGNOSIS — L930 Discoid lupus erythematosus: Secondary | ICD-10-CM

## 2017-02-07 DIAGNOSIS — Z882 Allergy status to sulfonamides status: Secondary | ICD-10-CM

## 2017-02-07 DIAGNOSIS — H409 Unspecified glaucoma: Secondary | ICD-10-CM

## 2017-02-07 DIAGNOSIS — N189 Chronic kidney disease, unspecified: Secondary | ICD-10-CM | POA: Diagnosis not present

## 2017-02-07 DIAGNOSIS — M479 Spondylosis, unspecified: Secondary | ICD-10-CM | POA: Diagnosis not present

## 2017-02-07 NOTE — Patient Instructions (Addendum)
[facility] Return visit with Dr Reece AgarG in 3 months Lab 1 week before  visit

## 2017-02-08 ENCOUNTER — Ambulatory Visit (HOSPITAL_COMMUNITY)
Admission: RE | Admit: 2017-02-08 | Discharge: 2017-02-08 | Disposition: A | Discharge status: Home / Self Care | Payer: Medicare Other | Source: Ambulatory Visit | Attending: Oncology | Admitting: Oncology

## 2017-02-08 DIAGNOSIS — D539 Nutritional anemia, unspecified: Secondary | ICD-10-CM | POA: Insufficient documentation

## 2017-02-08 LAB — CBC WITH DIFFERENTIAL/PLATELET
Basophils Absolute: 0 10*3/uL (ref 0.0–0.1)
Basophils Relative: 0 %
EOS ABS: 0 10*3/uL (ref 0.0–0.7)
Eosinophils Relative: 1 %
HCT: 33.7 % — ABNORMAL LOW (ref 36.0–46.0)
HEMOGLOBIN: 11.7 g/dL — AB (ref 12.0–15.0)
LYMPHS ABS: 1.3 10*3/uL (ref 0.7–4.0)
LYMPHS PCT: 30 %
MCH: 37.1 pg — AB (ref 26.0–34.0)
MCHC: 34.7 g/dL (ref 30.0–36.0)
MCV: 107 fL — AB (ref 78.0–100.0)
Monocytes Absolute: 0.3 10*3/uL (ref 0.1–1.0)
Monocytes Relative: 7 %
NEUTROS PCT: 62 %
Neutro Abs: 2.6 10*3/uL (ref 1.7–7.7)
Platelets: 163 10*3/uL (ref 150–400)
RBC: 3.15 MIL/uL — AB (ref 3.87–5.11)
RDW: 13 % (ref 11.5–15.5)
WBC: 4.2 10*3/uL (ref 4.0–10.5)

## 2017-02-08 LAB — FERRITIN: FERRITIN: 549 ng/mL — AB (ref 11–307)

## 2017-02-08 NOTE — Progress Notes (Signed)
Hematology and Oncology Follow Up Visit  Catherine Harrington [id number] 10-May-1946 71 y.o. 02/08/2017 7:45 AM   Principle Diagnosis: Encounter Diagnoses  Name Primary?  . 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 Macrocytic anemia   Clinical summary: 71 year old woman referred for further evaluation of chronic transaminase elevations with recent findings of increased iron, iron saturation, and ferritin. 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 Interpretation of the ferritin is confounded by an underlying collagen vascular disorder which might lead to chronic inflammation and potentially autoimmune hepatitis, and possible coexisting liver disease from long-standing alcohol use. 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 I repeated a ferritin which confirms that she is in a range that is associated with organ deposition/damage at 1308. Serum iron 188. Percent saturation 82%. Ultrasound of the liver with additional fibro-elastography shows a homogeneous pattern to the liver parenchyma with no focal lesions. No splenomegaly. (No signs of portal hypertension). But there are areas of 3 and 4+ fibrosis. We discussed options to decrease her iron burden. I recommended a trial of phlebotomy but we also discussed potential medical therapy with an iron chelating agent. Attempt at phlebotomy done on 07/06/2016. There was technical difficulty getting a large vein and she experienced significant pain. The procedure was aborted.  I initiated the groundwork necessary to start somebody on oral iron chelation with desferasirox, brand name Jadenu.  She had formal ophthalmology exam with slit lamp exam which was fortuitous since she was diagnosed with early glaucoma and started on treatment.  She had a hearing test due to the potential of the drug to  affect hearing as well as vision.  Urine screen to establish that she had no proteinuria at baseline.  At the end of the day, the biggest obstacle was financial.  Medicare would only cover a fraction of the cost. At time of most recent visit with me on January 22nd a "root cause analysis" of the problems she had with the first phlebotomy was done, and she together with her husband, decided she would try phlebotomy again at a different location with more experienced personnel, premedication with lorazepam, and local lidocaine anesthesia.  In view of pre-existing anemia I limited the phlebotomy to 300 mL's.  She had the first procedure on January 24 and everything went well.  She had a second procedure also without event on February 20.   Interim History: See discussion above.  She did resume phlebotomy with precautions noted above and did well.  It is too early to assess effects.  With a 300 mL volume removed and just 2 phlebotomies to date, we have only removed a maximum of 600 mg of iron and she probably has a total body store of 10,000 mg.  She will get another phlebotomy tomorrow and I will check a ferritin to see if we are heading in the right direction but I do not expect that it will be significantly different. On the other interim problems related to her chronic arthritis.  She is trying massage therapy and this is helping with some of her pain.  No other interim medical problems.  Medications: reviewed  Allergies:  Allergies  Allergen Reactions  . Prednisone Anaphylaxis  . Sulfonamide Derivatives     REACTION: throat swells  . Midol [Ibuprofen]     Review of Systems: See interim history  Remaining ROS negative:   Physical Exam: Blood pressure 132/75, pulse 84, temperature 97.6 F (36.4 C), temperature source Oral, height _0  (1.575 m), weight 104 lb 1.6 oz (47.2 kg), SpO2 99 %. Wt Readings from Last 3 Encounters:  02/07/17 104 lb 1.6 oz (47.2 kg)  01/18/17 101 lb 9.6 oz (46.1 kg)   12/20/16 101 lb 9.6 oz (46.1 kg)     General appearance: Thin, frail, Caucasian woman HENNT: Pharynx no erythema, exudate, mass, or ulcer. No thyromegaly or thyroid nodules Lymph nodes: No cervical, supraclavicular, or axillary lymphadenopathy Breasts:  Lungs: Clear to auscultation, resonant to percussion throughout Heart: Regular rhythm, no murmur, no gallop, no rub, no click, no edema Abdomen: Soft, nontender, normal bowel sounds, no mass, no organomegaly Extremities: No edema, no calf tenderness Musculoskeletal: no joint deformities GU:  Vascular: Carotid pulses 2+, no bruits, Neurologic: Alert, oriented, PERRLA, optic discs sharp and vessels normal, no hemorrhage or exudate, cranial nerves grossly normal, motor strength 5 over 5, reflexes 1+ symmetric, upper body coordination normal, gait unsteady due to chronic back pain Skin: No rash or ecchymosis  Lab Results: CBC W/Diff    Component Value Date/Time   WBC 3.6 12/20/2016 1127   WBC 5.7 08/17/2013 0515   RBC 3.45 (L) 12/20/2016 1127   RBC 3.15 (L) 08/17/2013 0515   HGB 11.5 (L) 01/18/2017 1030   HCT 34.0 (L) 01/18/2017 1030   HCT 37.2 12/20/2016 1127   PLT 160 12/20/2016 1127   MCV 108 (H) 12/20/2016 1127   MCH 35.4 (H) 12/20/2016 1127   MCH 36.2 (H) 08/17/2013 0515   MCHC 32.8 12/20/2016 1127   MCHC 35.3 08/17/2013 0515   RDW 13.8 12/20/2016 1127   LYMPHSABS 1.4 12/20/2016 1127   MONOABS 0.3 08/16/2013 0620   EOSABS 0.0 12/20/2016 1127   BASOSABS 0.0 12/20/2016 1127     Chemistry      Component Value Date/Time   NA 142 12/20/2016 1127   K 4.7 12/20/2016 1127   CL 103 12/20/2016 1127   CO2 22 12/20/2016 1127   BUN 13 12/20/2016 1127   CREATININE 0.95 12/20/2016 1127      Component Value Date/Time   CALCIUM 9.4 12/20/2016 1127   ALKPHOS 83 12/20/2016 1127   AST 97 (H) 12/20/2016 1127   ALT 67 (H) 12/20/2016 1127   BILITOT 0.6 12/20/2016 1127       Radiological Studies: No results  found.  Impression:  #1.  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  Areas of grade 3 fibrosis in the liver on ultrasound elastography. As outlined above, she did tolerate a modified phlebotomy program of 300 mL's removed every 2 weeks, premed with lorazepam, and use of local lidocaine anesthesia at the insertion site of the large bore phlebotomy needle.  Target ferritin is 150 or less.  I anticipate it will take a year to get to this level.  2.  Chronic renal insufficiency. Most recent creatinine has actually improved down to 0.95 as of January 22.  3.  Poor short-term memory  4.  Advanced degenerative arthritis of the spine  5.  Discoid lupus  6.  Glaucoma  7.  Chronic macrocytic anemia  CC: Patient Care Team: Levin Erp, MD as PCP - General   Murriel Hopper, MD, [place]  Hematology-Oncology/Internal Medicine     3/13/20187:45 AM

## 2017-02-08 NOTE — Progress Notes (Signed)
Catherine KailSusan Harrington presents today for phlebotomy per MD orders. Hbg 11.7   Hct 33.7 Phlebotomy procedure started at 1350 and ended at 1405 300 cc removed. Patient tolerated procedure well. Drank a coke and ate cheese and crackers.  IV needle removed intact. D/c to home with patient. Aware of next appointment.

## 2017-02-10 ENCOUNTER — Telehealth: Payer: Self-pay | Admitting: *Deleted

## 2017-02-10 NOTE — Telephone Encounter (Signed)
-----   Message from Levert FeinsteinJames M Granfortuna, MD sent at 02/09/2017  5:45 PM EDT ----- Call pt: ferritin down from 1175 to 549! This is great.

## 2017-02-10 NOTE — Telephone Encounter (Signed)
Pt called / informed "ferritin down from 1175 to 549! This is great" per Dr Cyndie ChimeGranfortuna. Stated she saw results on line last night and was excited.

## 2017-02-24 ENCOUNTER — Encounter (HOSPITAL_COMMUNITY): Payer: Self-pay

## 2017-02-24 ENCOUNTER — Ambulatory Visit (HOSPITAL_COMMUNITY)
Admission: RE | Admit: 2017-02-24 | Discharge: 2017-02-24 | Disposition: A | Discharge status: Home / Self Care | Payer: Medicare Other | Source: Ambulatory Visit | Attending: Oncology | Admitting: Oncology

## 2017-02-24 DIAGNOSIS — D539 Nutritional anemia, unspecified: Secondary | ICD-10-CM | POA: Insufficient documentation

## 2017-02-24 LAB — HEMOGLOBIN AND HEMATOCRIT, BLOOD
HCT: 32.6 % — ABNORMAL LOW (ref 36.0–46.0)
HEMOGLOBIN: 11.3 g/dL — AB (ref 12.0–15.0)

## 2017-02-24 NOTE — Discharge Instructions (Signed)
     Therapeutic Phlebotomy, Care After Refer to this sheet in the next few weeks. These instructions provide you with information about caring for yourself after your procedure. Your health care provider may also give you more specific instructions. Your treatment has been planned according to current medical practices, but problems sometimes occur. Call your health care provider if you have any problems or questions after your procedure. What can I expect after the procedure? After the procedure, it is common to have:  Light-headedness or dizziness. You may feel faint.  Nausea.  Tiredness. Follow these instructions at home: Activity  Return to your normal activities as directed by your health care provider. Most people can go back to their normal activities right away.  Avoid strenuous physical activity and heavy lifting or pulling for about 5 hours after the procedure. Do not lift anything that is heavier than 10 lb (4.5 kg).  Athletes should avoid strenuous exercise for at least 12 hours.  Change positions slowly for the remainder of the day. This will help to prevent light-headedness or fainting.  If you feel light-headed, lie down until the feeling goes away. Eating and drinking  Be sure to eat well-balanced meals for the next 24 hours.  Drink enough fluid to keep your urine clear or pale yellow.  Avoid drinking alcohol on the day that you had the procedure. Care of the Needle Insertion Site  Keep your bandage dry. You can remove the bandage after about 5 hours or as directed by your health care provider.  If you have bleeding from the needle insertion site, elevate your arm and press firmly on the site until the bleeding stops.  If you have bruising at the site, apply ice to the area:  Put ice in a plastic bag.  Place a towel between your skin and the bag.  Leave the ice on for 20 minutes, 2-3 times a day for the first 24 hours.  If the swelling does not go away  after 24 hours, apply a warm, moist washcloth to the area for 20 minutes, 2-3 times a day. General instructions  Avoid smoking for at least 30 minutes after the procedure.  Keep all follow-up visits as directed by your health care provider. It is important to continue with further therapeutic phlebotomy treatments as directed. Contact a health care provider if:  You have redness, swelling, or pain at the needle insertion site.  You have fluid, blood, or pus coming from the needle insertion site.  You feel light-headed, dizzy, or nauseated, and the feeling does not go away.  You notice new bruising at the needle insertion site.  You feel weaker than normal.  You have a fever or chills. Get help right away if:  You have severe nausea or vomiting.  You have chest pain.  You have trouble breathing. This information is not intended to replace advice given to you by your health care provider. Make sure you discuss any questions you have with your health care provider. Document Released: 04/19/2011 Document Revised: 07/17/2016 Document Reviewed: 11/11/2014 Elsevier Interactive Patient Education  2017 Elsevier Inc.  

## 2017-02-24 NOTE — Progress Notes (Signed)
Leanor KailSusan Widener presents today for phlebotomy per MD orders. HGB/HCT:per epic HGB 11.3 and HCT 32.6 Phlebotomy procedure started at 12:40 pm and ended at 1410. 300 cc removed. Patient tolerated procedure well. IV needle removed intact.

## 2017-03-11 ENCOUNTER — Ambulatory Visit (HOSPITAL_COMMUNITY)
Admission: RE | Admit: 2017-03-11 | Discharge: 2017-03-11 | Disposition: A | Discharge status: Home / Self Care | Payer: Medicare Other | Source: Ambulatory Visit | Attending: Oncology | Admitting: Oncology

## 2017-03-11 ENCOUNTER — Encounter (HOSPITAL_COMMUNITY): Payer: Self-pay

## 2017-03-11 DIAGNOSIS — D539 Nutritional anemia, unspecified: Secondary | ICD-10-CM | POA: Insufficient documentation

## 2017-03-11 LAB — HEMOGLOBIN AND HEMATOCRIT, BLOOD
HCT: 32.1 % — ABNORMAL LOW (ref 36.0–46.0)
HEMOGLOBIN: 11 g/dL — AB (ref 12.0–15.0)

## 2017-03-11 NOTE — Progress Notes (Signed)
Catherine Harrington presents today for phlebotomy per MD orders. HGB/HCT:11.0/32.1 Phlebotomy procedure started at 1237 and ended at 1254. 300 cc removed. Patient tolerated procedure well. IV needle removed intact.

## 2017-03-23 ENCOUNTER — Other Ambulatory Visit: Payer: Self-pay | Admitting: Oncology

## 2017-03-25 ENCOUNTER — Ambulatory Visit (HOSPITAL_COMMUNITY)
Admission: RE | Admit: 2017-03-25 | Discharge: 2017-03-25 | Disposition: A | Discharge status: Home / Self Care | Payer: Medicare Other | Source: Ambulatory Visit | Attending: Oncology | Admitting: Oncology

## 2017-03-25 ENCOUNTER — Encounter (HOSPITAL_COMMUNITY): Payer: Self-pay

## 2017-03-25 ENCOUNTER — Telehealth: Payer: Self-pay | Admitting: *Deleted

## 2017-03-25 LAB — HEMOGLOBIN: Hemoglobin: 11.3 g/dL — ABNORMAL LOW (ref 12.0–15.0)

## 2017-03-25 NOTE — Telephone Encounter (Signed)
Call from [facility] - states pt's BP down while receiving phlebotomy - it was 63/45, pt stated not feeling well. Then came up to 85/40's. And last BP at 12noon was 93/48. Hgb is 111.3 ac phlebotomy. They stopped after 250 ml. Informed hr Dr Cyndie Chime not here today. Recommended sending pt to ED which Lillia Abed agreed if pt's BP does not continues to increase and pt not feeling well. And I will inform Dr Reece Agar when he returns.

## 2017-03-25 NOTE — Progress Notes (Addendum)
Once phlebotomy was near completion, pt started saying she didn't feel well, felt she was [place] pass out.  Phlebotomy was stopped and vital signs assessed, bp was 62/45.  Pt was laid flat in recliner, feet elevated, cool cloth applied to forehead.  attemped to get MD on phone around 1110.  Continued to monitor pt. Around 1142, pt bp up to 84/44 and pt looking better in face, more alert, talking more.  At 1205, Glenda from office called back and spoke with her about situation.  She informed me Dr. Laurence Spates was out of town and advised sending pt to ER if she didn't improve.  Agreed with her and continued to monitor pt.  At 1217 pt's bp was 95/43, which is closer to her baseline bp today of 113/61.  Pt had no symptoms of feeling faint or dizzy.  Pt was informed she was ok to leave, but should symptoms arise again she should return to the ER.  Pt and her husband voiced understanding.  Pt was placed in wheelchair and husband pushed her out to lobby.  Pt's husband was in and out of room during pt's time in short stay and was aware of the situation.  Pt was given a coke to drink also.  Pt was offered crackers, but she declined.  At 1230 Dr. Laurence Spates called back after seeing/hearing our messages.  Informed him of what happened and that we were told by office he was out of town.  Informed MD Pt's bp and symptoms had improved enough for D/C and she was released at 1225, but she was advised to return to ER if symptoms return.    After phlebotomy was stopped and pt was having bp issues pt admitted she started to feel bad today after she took the ativan 1 mg an hour before her appointment at 0900.

## 2017-03-25 NOTE — Procedures (Signed)
Catherine Harrington presents today for phlebotomy per MD orders. HGB/HCT:11.3 Phlebotomy procedure started at 1033 and ended at 1100. 250 cc removed.  Pt started saying she didn't feel well and felt she was gonna pass out.  So phlebotomy was stopped.   IV needle removed intact.

## 2017-03-25 NOTE — Progress Notes (Signed)
FYI:  Tried something a little different today: Drew H&H with butterfly needle and sent to lab.   Once result was available and ok to proceed with phlebotomy, started #18 to left forearm and immediately hooked up to bag to pull off blood.  Bag on scale for accuracy.  Did attempt to use the spray to numb skin but pt still yelled out upon starting IV.

## 2017-03-28 ENCOUNTER — Encounter: Payer: Self-pay | Admitting: Oncology

## 2017-03-28 ENCOUNTER — Other Ambulatory Visit: Payer: Self-pay | Admitting: Oncology

## 2017-03-28 NOTE — Progress Notes (Signed)
Catherine Harrington is currently on a phlebotomy program with removal of 300 mL's of blood every 2 weeks for multifactorial iron overload syndrome including heterozygote status for the C282Y hemochromatosis gene.  Initial phlebotomy done on July 06, 2016.  Difficult obtaining vascular access.  Program resumed on January 24.  Most recent phlebotomy done on Friday, April 27.  When 250 cc of blood were removed, she suddenly felt poorly and experienced a significant fall in her blood pressure.  Pressure fell as low as 62/45.  She was put in the Trendelenburg position.  Blood pressure stabilized around 95/43.  I was out of town.  I did not get the message right away.  When I called the short stay unit, the patient had just been discharged 5 minutes earlier. I called her today.  She is feeling back to normal.  Her main complaint today is a flareup of her chronic back pain. I will take the precaution of infusing normal saline 250 cc prior to subsequent phlebotomies.  We are making significant progress with the program.  Ferritin down from 1308 in June 2017 to 549 as of February 08, 2017.  Once we get the ferritin down below 150, I should be able to decrease the frequency of the phlebotomies.

## 2017-04-04 ENCOUNTER — Telehealth (INDEPENDENT_AMBULATORY_CARE_PROVIDER_SITE_OTHER): Payer: Self-pay | Admitting: Physical Medicine and Rehabilitation

## 2017-04-04 NOTE — Telephone Encounter (Signed)
OV NOTES FAXED TO DR EDWIN GREEN AT [phone] PTS PCP

## 2017-04-12 ENCOUNTER — Telehealth: Payer: Self-pay | Admitting: *Deleted

## 2017-04-12 ENCOUNTER — Ambulatory Visit (HOSPITAL_COMMUNITY)
Admission: RE | Admit: 2017-04-12 | Discharge: 2017-04-12 | Disposition: A | Discharge status: Home / Self Care | Payer: Medicare Other | Source: Ambulatory Visit | Attending: Oncology | Admitting: Oncology

## 2017-04-12 ENCOUNTER — Encounter (HOSPITAL_COMMUNITY): Payer: Self-pay

## 2017-04-12 LAB — COMPREHENSIVE METABOLIC PANEL
ALK PHOS: 79 U/L (ref 38–126)
ALT: 78 U/L — AB (ref 14–54)
AST: 149 U/L — AB (ref 15–41)
Albumin: 3.8 g/dL (ref 3.5–5.0)
Anion gap: 8 (ref 5–15)
BUN: 9 mg/dL (ref 6–20)
CALCIUM: 8.9 mg/dL (ref 8.9–10.3)
CHLORIDE: 107 mmol/L (ref 101–111)
CO2: 22 mmol/L (ref 22–32)
Creatinine, Ser: 1.11 mg/dL — ABNORMAL HIGH (ref 0.44–1.00)
GFR calc Af Amer: 57 mL/min — ABNORMAL LOW (ref >60)
GFR calc non Af Amer: 49 mL/min — ABNORMAL LOW (ref >60)
GLUCOSE: 93 mg/dL (ref 65–99)
Potassium: 4 mmol/L (ref 3.5–5.1)
SODIUM: 137 mmol/L (ref 135–145)
Total Bilirubin: 0.4 mg/dL (ref 0.3–1.2)
Total Protein: 6.9 g/dL (ref 6.5–8.1)

## 2017-04-12 LAB — CBC WITH DIFFERENTIAL/PLATELET
BASOS ABS: 0 10*3/uL (ref 0.0–0.1)
Basophils Relative: 0 %
EOS ABS: 0 10*3/uL (ref 0.0–0.7)
EOS PCT: 1 %
HCT: 34.5 % — ABNORMAL LOW (ref 36.0–46.0)
HEMOGLOBIN: 11.5 g/dL — AB (ref 12.0–15.0)
LYMPHS ABS: 1 10*3/uL (ref 0.7–4.0)
LYMPHS PCT: 38 %
MCH: 36.2 pg — AB (ref 26.0–34.0)
MCHC: 33.3 g/dL (ref 30.0–36.0)
MCV: 108.5 fL — ABNORMAL HIGH (ref 78.0–100.0)
Monocytes Absolute: 0.2 10*3/uL (ref 0.1–1.0)
Monocytes Relative: 8 %
NEUTROS PCT: 53 %
Neutro Abs: 1.4 10*3/uL — ABNORMAL LOW (ref 1.7–7.7)
PLATELETS: 144 10*3/uL — AB (ref 150–400)
RBC: 3.18 MIL/uL — AB (ref 3.87–5.11)
RDW: 13.2 % (ref 11.5–15.5)
WBC: 2.7 10*3/uL — AB (ref 4.0–10.5)

## 2017-04-12 LAB — FERRITIN: Ferritin: 532 ng/mL — ABNORMAL HIGH (ref 11–307)

## 2017-04-12 MED ORDER — SODIUM CHLORIDE 0.9 % IV SOLN
INTRAVENOUS | Status: AC
  Administered 2017-04-12: 12:00:00 via INTRAVENOUS

## 2017-04-12 NOTE — Telephone Encounter (Signed)
-----   Message from Levert FeinsteinJames M Granfortuna, MD sent at 04/12/2017  2:58 PM EDT ----- Call pt: ferritin about the same as last time at 532; was 549. How did she do when extra fluids given before phlebotomy?

## 2017-04-12 NOTE — Telephone Encounter (Signed)
Called pt - unavailable. Talked to her husband - informed "ferritin about the same as last time at 532; was 549." per Dr Cyndie ChimeGranfortuna. Husband stated he talked to the nurse, when he picked her up, who stated she did a lot better with the extra fluids.

## 2017-04-12 NOTE — Discharge Instructions (Signed)
     Therapeutic Phlebotomy, Care After Refer to this sheet in the next few weeks. These instructions provide you with information about caring for yourself after your procedure. Your health care provider may also give you more specific instructions. Your treatment has been planned according to current medical practices, but problems sometimes occur. Call your health care provider if you have any problems or questions after your procedure. What can I expect after the procedure? After the procedure, it is common to have:  Light-headedness or dizziness. You may feel faint.  Nausea.  Tiredness. Follow these instructions at home: Activity  Return to your normal activities as directed by your health care provider. Most people can go back to their normal activities right away.  Avoid strenuous physical activity and heavy lifting or pulling for about 5 hours after the procedure. Do not lift anything that is heavier than 10 lb (4.5 kg).  Athletes should avoid strenuous exercise for at least 12 hours.  Change positions slowly for the remainder of the day. This will help to prevent light-headedness or fainting.  If you feel light-headed, lie down until the feeling goes away. Eating and drinking  Be sure to eat well-balanced meals for the next 24 hours.  Drink enough fluid to keep your urine clear or pale yellow.  Avoid drinking alcohol on the day that you had the procedure. Care of the Needle Insertion Site  Keep your bandage dry. You can remove the bandage after about 5 hours or as directed by your health care provider.  If you have bleeding from the needle insertion site, elevate your arm and press firmly on the site until the bleeding stops.  If you have bruising at the site, apply ice to the area:  Put ice in a plastic bag.  Place a towel between your skin and the bag.  Leave the ice on for 20 minutes, 2-3 times a day for the first 24 hours.  If the swelling does not go away  after 24 hours, apply a warm, moist washcloth to the area for 20 minutes, 2-3 times a day. General instructions  Avoid smoking for at least 30 minutes after the procedure.  Keep all follow-up visits as directed by your health care provider. It is important to continue with further therapeutic phlebotomy treatments as directed. Contact a health care provider if:  You have redness, swelling, or pain at the needle insertion site.  You have fluid, blood, or pus coming from the needle insertion site.  You feel light-headed, dizzy, or nauseated, and the feeling does not go away.  You notice new bruising at the needle insertion site.  You feel weaker than normal.  You have a fever or chills. Get help right away if:  You have severe nausea or vomiting.  You have chest pain.  You have trouble breathing. This information is not intended to replace advice given to you by your health care provider. Make sure you discuss any questions you have with your health care provider. Document Released: 04/19/2011 Document Revised: 07/17/2016 Document Reviewed: 11/11/2014 Elsevier Interactive Patient Education  2017 Elsevier Inc.  

## 2017-04-12 NOTE — Progress Notes (Signed)
Catherine KailSusan Harrington presents today for phlebotomy per MD orders. HGB 11.5. 250cc NS given IV over 2 hours then Phlebotomy procedure started at 1340 and ended at 1415. 300 cc removed.  Patient tolerated procedure well.

## 2017-04-20 ENCOUNTER — Ambulatory Visit (INDEPENDENT_AMBULATORY_CARE_PROVIDER_SITE_OTHER): Payer: Medicare Other | Admitting: Physical Medicine and Rehabilitation

## 2017-04-20 ENCOUNTER — Encounter (INDEPENDENT_AMBULATORY_CARE_PROVIDER_SITE_OTHER): Payer: Self-pay | Admitting: Physical Medicine and Rehabilitation

## 2017-04-20 VITALS — BP 110/56 | HR 86

## 2017-04-20 DIAGNOSIS — M4125 Other idiopathic scoliosis, thoracolumbar region: Secondary | ICD-10-CM

## 2017-04-20 DIAGNOSIS — M545 Low back pain, unspecified: Secondary | ICD-10-CM

## 2017-04-20 DIAGNOSIS — M47816 Spondylosis without myelopathy or radiculopathy, lumbar region: Secondary | ICD-10-CM | POA: Diagnosis not present

## 2017-04-20 DIAGNOSIS — F411 Generalized anxiety disorder: Secondary | ICD-10-CM | POA: Diagnosis not present

## 2017-04-20 DIAGNOSIS — G8929 Other chronic pain: Secondary | ICD-10-CM | POA: Diagnosis not present

## 2017-04-20 DIAGNOSIS — M7061 Trochanteric bursitis, right hip: Secondary | ICD-10-CM | POA: Diagnosis not present

## 2017-04-20 MED ORDER — GABAPENTIN 300 MG PO CAPS: 300.0000 mg | ORAL_CAPSULE | Freq: Every day | ORAL | 0 refills | Status: DC

## 2017-04-20 MED ORDER — LIDOCAINE HCL 2 % IJ SOLN
4.0000 mL | INTRAMUSCULAR | Status: AC | PRN
  Administered 2017-04-20: 4 mL

## 2017-04-20 MED ORDER — DIAZEPAM 5 MG PO TABS: ORAL_TABLET | ORAL | 0 refills | Status: DC

## 2017-04-20 MED ORDER — BUPIVACAINE HCL 0.25 % IJ SOLN
4.0000 mL | INTRAMUSCULAR | Status: AC | PRN
  Administered 2017-04-20: 4 mL via INTRA_ARTICULAR

## 2017-04-20 MED ORDER — TRIAMCINOLONE ACETONIDE 40 MG/ML IJ SUSP
80.0000 mg | INTRAMUSCULAR | Status: AC | PRN
  Administered 2017-04-20: 80 mg via INTRA_ARTICULAR

## 2017-04-20 NOTE — Progress Notes (Signed)
Analyse Angst - 71 y.o. female MRN [id number]  Date of birth: 1946-09-23  Office Visit Note: Visit Date: 04/20/2017 PCP: Nila Nephew, MD Referred by: Nila Nephew, MD  Subjective: Chief Complaint  Patient presents with  . Lower Back - Pain   HPI: Catherine Harrington is a 71 year old patient very well known to [place] with a complicated spine and pain in medical course. 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 She is followed by Dr. Cyndie Chime. The last time I saw her in the office was in November of last year we completed transforaminal epidural steroid injection with mild relief. She went on to complete another course of dry needling and physical therapy. She also saw one of the massage therapist in town that I use quite a bit, Fabiola Backer, and she reports everything she tries to [place] to help for a little while. Her biggest complaint is axial low back pain across the lumbar spine at about the L4 region. Is worse with standing for any length of time and walking. She says she can't walk very far at all without having to stop. She denies any pain down the legs. She is getting some tingling in the left foot which is new. It is not radicular down the foot. Is not bilaterally. She has not had this looked at she has not had any electrodiagnostic studies. She also complains of worsening pain if she sits for a long time. Her biggest complaint though is standing and walking. She also talks today about right hip and thigh pain which is lateral. This hurts when she lays on that side as she can't get comfortable trying to lay on the right side. This is something that she has had in the past and we have completed injections into the greater trochanteric area with good relief in the past. She said recently her primary care physician is really been treating her back pain and she did not return to see [place] because nothing really was helping. She says that  she is obtaining some sort of injection by Dr. Neil Crouch which sounds like more of a muscular injection of probably either Toradol or Kenalog. She said it only helped for a few days as well. She continues to try to exercise when she can. Her other biggest problem is she is still in every day smoker and has not discontinued this. She has a very slight individual. I do not know the status of her bone density but she has not had any prior compression fractures. She does have some pain at night. She does sleep very well though. She's not had any new focal trauma. She has not had any focal weakness. She does feel like her balance has declined. She is asking today about the possibility of a wheelchair to use when she can ambulate very far.    Review of Systems  Constitutional: Negative for chills, fever, malaise/fatigue and weight loss.  HENT: Negative for hearing loss and sinus pain.   Eyes: Negative for blurred vision, double vision and photophobia.  Respiratory: Negative for cough and shortness of breath.   Cardiovascular: Negative for chest pain, palpitations and leg swelling.  Gastrointestinal: Negative for abdominal pain, nausea and vomiting.  Genitourinary: Negative for flank pain.  Musculoskeletal: Positive for back pain and joint pain. Negative for myalgias.  Skin: Negative for itching and rash.  Neurological: Positive for tingling. Negative for tremors, focal weakness and weakness.  Endo/Heme/Allergies: Negative.  Psychiatric/Behavioral: Negative for depression.  All other systems reviewed and are negative.  Otherwise per HPI.  Assessment & Plan: Visit Diagnoses:  1. Greater trochanteric bursitis, right   2. Chronic bilateral low back pain without sciatica   3. Spondylosis without myelopathy or radiculopathy, lumbar region   4. Other idiopathic scoliosis, thoracolumbar region   5. Pre-operative anxiety     Plan: Findings:  Chronic worsening severe low back pain worse with standing  somewhat worse with sitting. This pain can be excruciating pain that she'll have to find a place to sit down and sometimes she just can't walk because of the pain. Prior injections of helped to a degree but a bit short lived. Prior facet joint block performed by very first saw her did help but was temporary and at that time we got into a situation where her pain was more in the buttock region and more posterior lateral. I do think most of this back pain is facet mediated. She has pretty significant scoliosis and the last MRI in 2015 showed some narrowing of the lateral recesses but no focal central canal stenosis or disc herniations. Again her pain is behaving more like facet joints. With the worsening recalcitrant back pain as well as history of smoking I think the next step is to repeat the MRI of the lumbar spine. If he still shows mostly facet arthropathy and scoliosis I would look a diagnostic medial branch blocks and possibly something like a radiofrequency ablation. She has failed conservative care through multiple medications including opioids and tramadol that she just doesn't tolerate as well as muscle relaxers as well as anti-inflammatories which she cannot take now because of the hemachromatosis. She also has failed TENS units and therapy and dry needling. She is using a small lumbar corset at times and I think that is fair. Order was put in for an MRI of the lumbar spine. Valium was given because of claustrophobia. We also did put him for wheelchair.  For other problem of right hip and greater trochanteric pain I think this is a bursitis and we did perform a bursa injection today. She has a graduation that she is going to neck suite and hopefully this will help for that.  In terms of the left foot numbness on not sure what to make of it. It could be spine related although to be strains to have sort of a nondermatomal foot numbness without really radicular pain down the leg. We would consider  ordering electrodiagnostic studies or referral to a neurologist. For now her to start gabapentin at night 300 mg and I told her would probably help her sleep and may give her some initial relief of the tingling. Depending on how that goes it would be a medication that we may be able to increase over time.    Meds & Orders:  Meds ordered this encounter  Medications  . gabapentin (NEURONTIN) 300 MG capsule    Sig: Take 1 capsule (300 mg total) by mouth at bedtime.    Dispense:  90 capsule    Refill:  0  . diazepam (VALIUM) 5 MG tablet    Sig: Take 1 by mouth 1 to 2 hours pre-procedure. May repeat if necessary.    Dispense:  2 tablet    Refill:  0    Orders Placed This Encounter  Procedures  . Large Joint Injection/Arthrocentesis  . Wheelchair  . MR LUMBAR SPINE WO CONTRAST    Follow-up: Return for MRI review after completion.  Procedures: Greater trochanter injection Date/Time: 04/20/2017 12:57 PM Performed by: Catherine Harrington Authorized by: Catherine Harrington   Consent Given by:  Parent Site marked: the procedure site was marked   Timeout: prior to procedure the correct patient, procedure, and site was verified   Indications:  Pain and diagnostic evaluation Location:  Hip Site:  R greater trochanter Prep: patient was prepped and draped in usual sterile fashion   Needle Size:  22 G Needle Length:  3.5 inches Approach:  Lateral Ultrasound Guidance: No   Fluoroscopic Guidance: No   Arthrogram: No   Medications:  80 mg triamcinolone acetonide 40 MG/ML; 4 mL lidocaine 2 %; 4 mL bupivacaine 0.25 % Aspiration Attempted: No   Patient tolerance:  Patient tolerated the procedure well with no immediate complications  Greatest area of pain over the greater trochanter was palpated and marked prior to injection. The patient did seem to have relief after the injection.    No notes on file   Clinical History: Lspine MRI 06/11/14 There is moderate lumbar dextroscoliosis with apex at  L3. There is left lateral listhesis of L2 on L3 and right lateral listhesis of L3 on L4 and L4 on L5. Bone marrow signal is somewhat heterogeneous diffusely and overall decreased in signal in the lower thoracic spine (possibly reflecting patient's history of anemia) with prominent fatty degenerative marrow changes in the mid and lower lumbar spine. Modic type 1 degenerative marrow changes are also noted in this region. There is lateral recess narrowing at L2-3 on the right.  She reports that she has been smoking Cigarettes.  She has a 48.00 pack-year smoking history. She has never used smokeless tobacco. No results for input(s): HGBA1C, LABURIC in the last 8760 hours.  Objective:  VS:  HT:    WT:   BMI:     BP:(!) 110/56  HR:86bpm  TEMP: ( )  RESP:  Physical Exam  Constitutional: She is oriented to person, place, and time. She appears well-developed and well-nourished. No distress.  HENT:  Head: Normocephalic and atraumatic.  Nose: Nose normal.  Mouth/Throat: Oropharynx is clear and moist.  Eyes: Conjunctivae are normal. Pupils are equal, round, and reactive to light.  Neck: Normal range of motion. Neck supple. No tracheal deviation present.  Cardiovascular: Regular rhythm and intact distal pulses.   Pulmonary/Chest: Effort normal. No respiratory distress. She has wheezes.  Chronic cough. She is using some accessory muscles for breathing.  Abdominal: She exhibits no distension. There is no guarding.  Musculoskeletal:  Patient is slow to rise from a seated position. She stands with a forward flexed spine with obvious lumbar scoliosis. She has pain with extension rotation of the lumbar spine. She has pain exquisitely over the right greater trochanter and on the left. This does reproduce her pain. She has no pain with hip rotation. She has good distal strength without clonus.  Neurological: She is alert and oriented to person, place, and time. She exhibits normal muscle tone. Coordination  normal.  Skin: Skin is warm. No rash noted. No erythema.  Psychiatric: She has a normal mood and affect. Her behavior is normal.  Nursing note and vitals reviewed.   Ortho Exam Imaging: No results found.  Past Medical/Family/Surgical/Social History: Medications & Allergies reviewed per EMR Patient Active Problem List   Diagnosis Date Noted  . Macrocytic anemia 06/29/2016  . Chronic renal insufficiency, stage III (moderate) 06/29/2016  . Acute right flank pain 06/29/2016  . Chronic renal insufficiency 06/29/2016  . Hemochromatosis 05/18/2016  .  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 Macrocytosis 08/16/2013  . Syncope 08/16/2013  . DIVERTICULOSIS, COLON 11/04/2010   Past Medical History:  Diagnosis Date  . Acute right flank pain 06/29/2016  . Arthritis    "related to the lupus; joints" (08/16/2013)  . Chronic renal insufficiency, stage III (moderate) 06/29/2016  . 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 Family history of anesthesia complication    "daughter doesn't wake up easy from it" (08/16/2013)  . 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 Hypertension   . Iron deficiency anemia   . Lumbar disc disease   . Lupus    "just the rash and arthritis" (08/16/2013)  . Macrocytic anemia 06/29/2016  . 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 Scoliosis   . Syncope and collapse    "lost consciousness ~ 4-5 seconds; didn't hurt myself" (08/16/2013)   Family History  Problem Relation Age of Onset  . Hypertension Mother   . Hypertension Father    Past Surgical History:  Procedure Laterality Date  . CESAREAN SECTION  1967; 1971  . MOUTH SURGERY  2013   " infection under bridge cleaned out, etc" (08/16/2013)  . TUBAL LIGATION     Social History   Occupational History  . retired    Social History Main Topics  . Smoking status: Current Every Day Smoker    Packs/day: 1.00    Years: 48.00    Types:  Cigarettes  . Smokeless tobacco: Never Used  . Alcohol use 12.6 oz/week    21 Cans of beer per week     Comment:  3-4 drinks daily sometimes.  . Drug use: No  . Sexual activity: Not Currently    Birth control/ protection: Post-menopausal

## 2017-04-20 NOTE — Progress Notes (Deleted)
Lower back pain more on left side. Right thigh pain. Left foot numbness. Hurts riding in car and with activity. Cannot take Diclofenac due to having too much iron in liver.

## 2017-04-20 NOTE — Patient Instructions (Signed)
Tylenol 500mg , 2 caps three times per day

## 2017-04-27 ENCOUNTER — Ambulatory Visit (HOSPITAL_COMMUNITY): Payer: Medicare Other

## 2017-05-03 ENCOUNTER — Ambulatory Visit (HOSPITAL_COMMUNITY)
Admission: RE | Admit: 2017-05-03 | Discharge: 2017-05-03 | Disposition: A | Discharge status: Home / Self Care | Payer: Medicare Other | Source: Ambulatory Visit | Attending: Oncology | Admitting: Oncology

## 2017-05-03 ENCOUNTER — Encounter (HOSPITAL_COMMUNITY): Payer: Self-pay

## 2017-05-03 LAB — HEMOGLOBIN AND HEMATOCRIT, BLOOD
HCT: 35.6 % — ABNORMAL LOW (ref 36.0–46.0)
HEMOGLOBIN: 12.3 g/dL (ref 12.0–15.0)

## 2017-05-03 MED ORDER — SODIUM CHLORIDE 0.9 % IV SOLN
INTRAVENOUS | Status: AC
  Administered 2017-05-03: 13:00:00 via INTRAVENOUS

## 2017-05-03 NOTE — Progress Notes (Signed)
Catherine Harrington presents today for phlebotomy per MD orders.  She received 250 ml NS over 2 hours prior to phlebotomy procedure.   HGB 12.5 Phlebotomy procedure started at 1445 and ended at 1510 . 300 cc removed. Patient tolerated procedure well. Vital signs pre and post procedure remained stable. Pt states she took 1 ativan prior to arrival and also 1 pill once she was in her room.   IV needle removed intact. Pt stated being a little dizzy once husband was here just before d/c, but vital signs were stable and unchanged.  Pt placed in wheelchair and husband took pt to lobby.  Encouraged pt to go home and sleep off the ativan.  Pt was calm, relaxed and talkative today after the ativan.

## 2017-05-09 ENCOUNTER — Ambulatory Visit
Admission: RE | Admit: 2017-05-09 | Discharge: 2017-05-09 | Disposition: A | Discharge status: Home / Self Care | Payer: Medicare Other | Source: Ambulatory Visit | Attending: Physical Medicine and Rehabilitation | Admitting: Physical Medicine and Rehabilitation

## 2017-05-09 DIAGNOSIS — M4125 Other idiopathic scoliosis, thoracolumbar region: Secondary | ICD-10-CM

## 2017-05-09 DIAGNOSIS — G8929 Other chronic pain: Secondary | ICD-10-CM

## 2017-05-09 DIAGNOSIS — M545 Low back pain, unspecified: Secondary | ICD-10-CM

## 2017-05-09 DIAGNOSIS — M47816 Spondylosis without myelopathy or radiculopathy, lumbar region: Secondary | ICD-10-CM

## 2017-05-11 ENCOUNTER — Ambulatory Visit (HOSPITAL_COMMUNITY): Payer: Medicare Other

## 2017-05-17 ENCOUNTER — Encounter (HOSPITAL_COMMUNITY): Payer: Self-pay

## 2017-05-17 ENCOUNTER — Other Ambulatory Visit: Payer: Self-pay | Admitting: Oncology

## 2017-05-17 ENCOUNTER — Ambulatory Visit (HOSPITAL_COMMUNITY)
Admission: RE | Admit: 2017-05-17 | Discharge: 2017-05-17 | Disposition: A | Discharge status: Home / Self Care | Payer: Medicare Other | Source: Ambulatory Visit | Attending: Oncology | Admitting: Oncology

## 2017-05-17 LAB — HEMOGLOBIN: Hemoglobin: 11.8 g/dL — ABNORMAL LOW (ref 12.0–15.0)

## 2017-05-17 MED ORDER — SODIUM CHLORIDE 0.9 % IV SOLN
INTRAVENOUS | Status: AC
  Administered 2017-05-17: 11:00:00 via INTRAVENOUS

## 2017-05-17 NOTE — Progress Notes (Signed)
Leanor KailSusan Shabazz presents today for phlebotomy per MD orders.  250 ml NS over 2 hours completed at 1320.   HGB/HCT: 11.8 Phlebotomy procedure started at 1325 and ended at 1337. 300 cc removed. Patient tolerated procedure well.  VSS. IV needle removed intact. Pt only took 1 ativan today pre-procedure.  Pt did well today.

## 2017-05-19 ENCOUNTER — Ambulatory Visit (INDEPENDENT_AMBULATORY_CARE_PROVIDER_SITE_OTHER): Payer: Medicare Other | Admitting: Physical Medicine and Rehabilitation

## 2017-05-19 ENCOUNTER — Encounter (INDEPENDENT_AMBULATORY_CARE_PROVIDER_SITE_OTHER): Payer: Self-pay | Admitting: Physical Medicine and Rehabilitation

## 2017-05-19 VITALS — BP 129/62 | HR 98

## 2017-05-19 DIAGNOSIS — M5416 Radiculopathy, lumbar region: Secondary | ICD-10-CM

## 2017-05-19 DIAGNOSIS — M4125 Other idiopathic scoliosis, thoracolumbar region: Secondary | ICD-10-CM | POA: Diagnosis not present

## 2017-05-19 MED ORDER — DIAZEPAM 5 MG PO TABS: ORAL_TABLET | ORAL | 0 refills | Status: DC

## 2017-05-19 NOTE — Progress Notes (Deleted)
Here for MRI review. Reports pain is unchanged.

## 2017-05-23 ENCOUNTER — Encounter (INDEPENDENT_AMBULATORY_CARE_PROVIDER_SITE_OTHER): Payer: Self-pay | Admitting: Physical Medicine and Rehabilitation

## 2017-05-23 NOTE — Progress Notes (Signed)
Catherine Harrington - 71 y.o. female MRN [id number]  Date of birth: [birth year]  Office Visit Note: Visit Date: 05/19/2017 PCP: Nila Nephew, MD Referred by: Nila Nephew, MD  Subjective: Chief Complaint  Patient presents with  . Lower Back - Pain   HPI: Catherine Harrington is very pleasant 71 year old female well known to our office with chronic worsening severe right low back and hip pain which is felt to be radicular. She has had recent MRI of the lumbar spine due to the fact that she was just getting no relief with any treatment of this right hip pain. Her pain continues unabated. She continues with her current medication regimen and activity. She recently requested a wheelchair because she was going to be going to a graduation and just feels like she can't walk very far at this point. She was able to get that but it seemed like it was a quite an ordeal obtaining the wheelchair. Nonetheless she comes in today for mainly MRI review and evaluation again for her pain. She has no paresthesias down the leg but she has significant hip pain on the right. No groin pain. She does have some pain across the lower back. She has failed conservative care and medication management. She has failed treatment with most recent trigger point injections and therapy. She has felt facet joint blocks. In the past she has responded to epidural injection. When we originally saw her she was having more left-sided symptoms and she does have more of a scoliotic curve on the left. She does have significant scoliosis.    Review of Systems  Constitutional: Negative for chills, fever, malaise/fatigue and weight loss.  HENT: Negative for hearing loss and sinus pain.   Eyes: Negative for blurred vision, double vision and photophobia.  Respiratory: Negative for cough and shortness of breath.   Cardiovascular: Negative for chest pain, palpitations and leg swelling.  Gastrointestinal: Negative for abdominal pain, nausea and vomiting.    Genitourinary: Negative for flank pain.  Musculoskeletal: Positive for back pain. Negative for myalgias.  Skin: Negative for itching and rash.  Neurological: Negative for tremors, focal weakness and weakness.  Endo/Heme/Allergies: Negative.   Psychiatric/Behavioral: Negative for depression.  All other systems reviewed and are negative.  Otherwise per HPI.  Assessment & Plan: Visit Diagnoses:  1. Other idiopathic scoliosis, thoracolumbar region   2. Lumbar radiculopathy     Plan: Findings:  Significant scoliosis with chronic pain syndrome and chronic low back and right hip pain now that has been unrelenting and recalcitrant to medication management and therapy and facet joint block. MRI does not show any new changes. This is reviewed below. She has significant dextroscoliosis with more left-sided findings. There is a area on the right at the bottom at L4-5 which could be a source of her pain. She does have multiple areas of severe foraminal stenosis.  On exam she has good strength. She is a chronic smoker. We will go ahead and try diagnostic right L4 transforaminal epidural injection. We will provide Valium preprocedure. Hopefully this gives her some relief. She probably should see a spine surgeon for evaluation although it would be a pretty extensive surgery unless they could look at this one area to see if this really were hip pain was coming from. We've looked at her pelvis as well without any problems. Hips looked fine as well is no pain with hip rotation.    Meds & Orders:  Meds ordered this encounter  Medications  . diazepam (VALIUM) 5  MG tablet    Sig: Take 1 by mouth 1 to 2 hours pre-procedure. May repeat if necessary.    Dispense:  2 tablet    Refill:  0   No orders of the defined types were placed in this encounter.   Follow-up: Return for Right L4 transforaminal epidural steroid injection.   Procedures: No procedures performed  No notes on file   Clinical History: MRI  LUMBAR SPINE WITHOUT CONTRAST 05/09/2017  TECHNIQUE: Multiplanar, multisequence MR imaging of the lumbar spine was performed. No intravenous contrast was administered.  COMPARISON:  Lumbar spine MRI 06/11/2014  FINDINGS: Segmentation:  Normal  Alignment:  There is right convex lumbar scoliosis with apex at L3.  Vertebrae: Bone marrow signal is heterogeneous, unchanged. No evidence of discitis osteomyelitis. No acute compression fracture.  Conus medullaris: Extends to the L1 level and appears normal.  Paraspinal and other soft tissues: There is fatty atrophy of the posterior paraspinous muscles, slightly worse on the left.  Disc levels:  T9-T12: These levels are assessed on sagittal sequences only. No disc herniation, spinal canal stenosis or neural foraminal narrowing.  T12-L1: Normal disc space and facets. No spinal canal or neuroforaminal stenosis.  L1-L2: Severe disc space narrowing with minimal bulge. Moderate right and mild left neural foraminal narrowing, primarily caused by scoliotic curvature.  L2-L3: Severe disc space loss with endplate osteophytosis. No spinal canal stenosis. Moderate right and severe left neural foraminal stenosis, unchanged. Left-greater-than-right facet arthrosis.  L3-L4: Severe disc space narrowing. No spinal canal stenosis. There is unchanged severe left foraminal stenosis. Right neural foramen is widely patent.  L4-L5: Disc space narrowing with central disc protrusion, unchanged. Severe bilateral facet hypertrophy, also unchanged. No central spinal canal stenosis. Severe right and moderate left neural foraminal stenosis, unchanged.  L5-S1: Moderate bilateral facet hypertrophy but no stenosis.  Visualized sacrum: Normal.  IMPRESSION: 1. Unchanged appearance of marked lumbar dextroscoliosis, which, in combination degenerative disc disease and facet arthrosis, that leads to severe neural foraminal narrowing at  multiple levels, including L2-3, L3-4 and L4-5. These findings are unchanged, however, compared to the MRI of 06/11/2014. 2. No central spinal canal stenosis of the lumbar spine. 3. Multilevel moderate to severe facet arthrosis, which may serve as a source of localized back pain.  Lspine MRI 06/11/14 There is moderate lumbar dextroscoliosis with apex at L3. There is left lateral listhesis of L2 on L3 and right lateral listhesis of L3 on L4 and L4 on L5. Bone marrow signal is somewhat heterogeneous diffusely and overall decreased in signal in the lower thoracic spine (possibly reflecting patient's history of anemia) with prominent fatty degenerative marrow changes in the mid and lower lumbar spine. Modic type 1 degenerative marrow changes are also noted in this region. There is lateral recess narrowing at L2-3 on the right.  She reports that she has been smoking Cigarettes.  She has a 48.00 pack-year smoking history. She has never used smokeless tobacco. No results for input(s): HGBA1C, LABURIC in the last 8760 hours.  Objective:  VS:  HT:    WT:   BMI:     BP:129/62  HR:98bpm  TEMP: ( )  RESP:  Physical Exam  Constitutional: She is oriented to person, place, and time. She appears well-developed and well-nourished.  Eyes: Conjunctivae and EOM are normal. Pupils are equal, round, and reactive to light.  Cardiovascular: Normal rate and intact distal pulses.   Pulmonary/Chest: Effort normal.  Musculoskeletal:  Lumbar spine reveals left 4 curvature of the lumbar spine  with scoliosis. There is trigger points and pain to palpation along the paraspinal musculature and upper buttock region. This mild pain over the greater trochanters bilaterally. There is no pain with hip rotation internal or external. She has good distal strength without clonus.  Neurological: She is alert and oriented to person, place, and time. She exhibits normal muscle tone.  Skin: Skin is warm and dry. No rash noted. No  erythema.  Psychiatric: She has a normal mood and affect. Her behavior is normal.  Nursing note and vitals reviewed.   Ortho Exam Imaging: No results found.  Past Medical/Family/Surgical/Social History: Medications & Allergies reviewed per EMR Patient Active Problem List   Diagnosis Date Noted  . Macrocytic anemia 06/29/2016  . Chronic renal insufficiency, stage III (moderate) 06/29/2016  . Acute right flank pain 06/29/2016  . Chronic renal insufficiency 06/29/2016  . Hemochromatosis 05/18/2016  . 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 Macrocytosis 08/16/2013  . Syncope 08/16/2013  . DIVERTICULOSIS, COLON 11/04/2010   Past Medical History:  Diagnosis Date  . Acute right flank pain 06/29/2016  . Arthritis    "related to the lupus; joints" (08/16/2013)  . Chronic renal insufficiency, stage III (moderate) 06/29/2016  . 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 Family history of anesthesia complication    "daughter doesn't wake up easy from it" (08/16/2013)  . 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 Hypertension   . Iron deficiency anemia   . Lumbar disc disease   . Lupus    "just the rash and arthritis" (08/16/2013)  . Macrocytic anemia 06/29/2016  . 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 Scoliosis   . Syncope and collapse    "lost consciousness ~ 4-5 seconds; didn't hurt myself" (08/16/2013)   Family History  Problem Relation Age of Onset  . Hypertension Mother   . Hypertension Father    Past Surgical History:  Procedure Laterality Date  . CESAREAN SECTION  1967; 1971  . MOUTH SURGERY  2013   " infection under bridge cleaned out, etc" (08/16/2013)  . TUBAL LIGATION     Social History   Occupational History  . retired    Social History Main Topics  . Smoking status: Current Every Day Smoker    Packs/day: 1.00    Years: 48.00    Types: Cigarettes  . Smokeless tobacco: Never Used  .  Alcohol use 12.6 oz/week    21 Cans of beer per week     Comment:  3-4 drinks daily sometimes.  . Drug use: No  . Sexual activity: Not Currently    Birth control/ protection: Post-menopausal

## 2017-05-25 ENCOUNTER — Ambulatory Visit (HOSPITAL_COMMUNITY): Payer: Medicare Other

## 2017-05-31 ENCOUNTER — Ambulatory Visit (HOSPITAL_COMMUNITY): Payer: Medicare Other

## 2017-06-06 ENCOUNTER — Encounter (INDEPENDENT_AMBULATORY_CARE_PROVIDER_SITE_OTHER): Payer: Self-pay | Admitting: Physical Medicine and Rehabilitation

## 2017-06-06 ENCOUNTER — Ambulatory Visit (INDEPENDENT_AMBULATORY_CARE_PROVIDER_SITE_OTHER): Payer: Medicare Other | Admitting: Physical Medicine and Rehabilitation

## 2017-06-06 ENCOUNTER — Ambulatory Visit (INDEPENDENT_AMBULATORY_CARE_PROVIDER_SITE_OTHER): Payer: Medicare Other

## 2017-06-06 VITALS — BP 128/73 | HR 81

## 2017-06-06 DIAGNOSIS — M4125 Other idiopathic scoliosis, thoracolumbar region: Secondary | ICD-10-CM | POA: Diagnosis not present

## 2017-06-06 DIAGNOSIS — M5416 Radiculopathy, lumbar region: Secondary | ICD-10-CM

## 2017-06-06 MED ORDER — METHYLPREDNISOLONE ACETATE 80 MG/ML IJ SUSP
80.0000 mg | Freq: Once | INTRAMUSCULAR | Status: AC
Start: 2017-06-06 — End: 2017-06-06
  Administered 2017-06-06: 80 mg

## 2017-06-06 MED ORDER — LIDOCAINE HCL (PF) 1 % IJ SOLN
2.0000 mL | Freq: Once | INTRAMUSCULAR | Status: AC
  Administered 2017-06-06: 2 mL

## 2017-06-06 NOTE — Progress Notes (Deleted)
Patient is here today for planned right L4 and L5 transforaminal injection. No change in symptoms. 

## 2017-06-06 NOTE — Patient Instructions (Signed)

## 2017-06-07 NOTE — Procedures (Signed)
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 She's had chronic back pain for quite some time particularly related to the right low back and buttock region. She states the trigger point area has actually calmed down quite a bit for some reason and that part actually does feel better. 7 a lot of pain into the buttock and hip region. Within a complete a right L4-L5 transforaminal epidural steroid injection. 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 Her case is complicated by lupus. Universal Protocol:     Consent Given By: the patient  Position: PRONE   Additional Comments: Vital signs were monitored before and after the procedure. Patient was prepped and draped in the usual sterile fashion. The correct patient, procedure, and site was verified.   Injection Procedure Details:  Procedure Site One Meds Administered:  Meds ordered this encounter  Medications  . lidocaine (PF) (XYLOCAINE) 1 % injection 2 mL  . methylPREDNISolone acetate (DEPO-MEDROL) injection 80 mg      Laterality: Right  Location/Site:  L4-L5 L5-S1  Needle size: 22 G  Needle type: Spinal  Needle Placement: Transforaminal  Findings:  -Contrast Used: 1 mL iohexol 180 mg iodine/mL   -Comments: Excellent flow of contrast along the nerve and into the epidural space.  Procedure Details: After squaring off the end-plates of the desired vertebral level to get a true AP view, the C-arm was obliqued to the painful side so that the superior articulating process is positioned about 1/3 the length of the inferior endplate.  The needle was aimed toward the junction  of the superior articular process and the transverse process of the inferior vertebrae. The needle's initial entry is in the lower third of the foramen through Kambin's triangle. The soft tissues overlying this target were infiltrated with 2-3 ml. of 1% Lidocaine without Epinephrine.  The spinal needle was then inserted and advanced toward the target using a "trajectory" view along the fluoroscope beam.  Under AP and lateral visualization, the needle was advanced so it did not puncture dura and did not traverse medially beyond the 6 o'clock position of the pedicle. Bi-planar projections were used to confirm position. Aspiration was confirmed to be negative for CSF and/or blood. A 1-2 ml. volume of Isovue-250 was injected and flow of contrast was noted at each level. Radiographs were obtained for documentation purposes.   After attaining the desired flow of contrast documented above, a 0.5 to 1.0 ml test dose of 0.25% Marcaine was injected into each respective transforaminal space.  The patient was observed for 90 seconds post injection.  After no sensory deficits were reported, and normal lower extremity motor function was noted,   the above injectate was administered so that equal amounts of the injectate were placed at each foramen (level) into the transforaminal epidural space.   Additional Comments:  The patient tolerated the procedure well Dressing: Band-Aid    Post-procedure details: Patient was observed during the procedure. Post-procedure instructions were reviewed.  Patient left the clinic in stable condition.

## 2017-06-08 ENCOUNTER — Encounter (HOSPITAL_COMMUNITY): Payer: Self-pay

## 2017-06-08 ENCOUNTER — Encounter (HOSPITAL_COMMUNITY)
Admission: RE | Admit: 2017-06-08 | Discharge: 2017-06-08 | Disposition: A | Discharge status: Home / Self Care | Payer: Medicare Other | Source: Ambulatory Visit | Attending: Oncology | Admitting: Oncology

## 2017-06-08 ENCOUNTER — Ambulatory Visit (HOSPITAL_COMMUNITY): Payer: Medicare Other

## 2017-06-08 LAB — COMPREHENSIVE METABOLIC PANEL
ALK PHOS: 91 U/L (ref 38–126)
ALT: 58 U/L — AB (ref 14–54)
ANION GAP: 10 (ref 5–15)
AST: 83 U/L — ABNORMAL HIGH (ref 15–41)
Albumin: 4.3 g/dL (ref 3.5–5.0)
BUN: 13 mg/dL (ref 6–20)
CALCIUM: 9.3 mg/dL (ref 8.9–10.3)
CO2: 23 mmol/L (ref 22–32)
CREATININE: 1.12 mg/dL — AB (ref 0.44–1.00)
Chloride: 105 mmol/L (ref 101–111)
GFR, EST AFRICAN AMERICAN: 56 mL/min — AB (ref >60)
GFR, EST NON AFRICAN AMERICAN: 48 mL/min — AB (ref >60)
Glucose, Bld: 95 mg/dL (ref 65–99)
Potassium: 3.9 mmol/L (ref 3.5–5.1)
SODIUM: 138 mmol/L (ref 135–145)
Total Bilirubin: 0.4 mg/dL (ref 0.3–1.2)
Total Protein: 7.6 g/dL (ref 6.5–8.1)

## 2017-06-08 LAB — CBC WITH DIFFERENTIAL/PLATELET
Basophils Absolute: 0 10*3/uL (ref 0.0–0.1)
Basophils Relative: 0 %
EOS ABS: 0 10*3/uL (ref 0.0–0.7)
EOS PCT: 0 %
HCT: 35 % — ABNORMAL LOW (ref 36.0–46.0)
HEMOGLOBIN: 12.1 g/dL (ref 12.0–15.0)
LYMPHS ABS: 1.5 10*3/uL (ref 0.7–4.0)
LYMPHS PCT: 29 %
MCH: 37.1 pg — AB (ref 26.0–34.0)
MCHC: 34.6 g/dL (ref 30.0–36.0)
MCV: 107.4 fL — AB (ref 78.0–100.0)
MONOS PCT: 8 %
Monocytes Absolute: 0.4 10*3/uL (ref 0.1–1.0)
Neutro Abs: 3.3 10*3/uL (ref 1.7–7.7)
Neutrophils Relative %: 63 %
PLATELETS: 178 10*3/uL (ref 150–400)
RBC: 3.26 MIL/uL — ABNORMAL LOW (ref 3.87–5.11)
RDW: 13.1 % (ref 11.5–15.5)
WBC: 5.2 10*3/uL (ref 4.0–10.5)

## 2017-06-08 LAB — FERRITIN: FERRITIN: 345 ng/mL — AB (ref 11–307)

## 2017-06-08 MED ORDER — SODIUM CHLORIDE 0.9 % IV SOLN
INTRAVENOUS | Status: AC
  Administered 2017-06-08: 12:00:00 via INTRAVENOUS

## 2017-06-08 NOTE — Progress Notes (Signed)
Pt c/o bilateral leg cramps.  Pt states she has these all the time, she took an over the counter leg cramp pill.  Got pt up and tried "walking" off the cramp at pts request. Massaged the calf muscles also. Lowered pt's legs in recliner to try and help cramps.  Left pt sitting in chair.  Pt flexing legs, trying to work through cramp.

## 2017-06-10 ENCOUNTER — Other Ambulatory Visit: Payer: Self-pay | Admitting: Oncology

## 2017-06-13 ENCOUNTER — Telehealth: Payer: Self-pay | Admitting: *Deleted

## 2017-06-13 NOTE — Telephone Encounter (Signed)
-----   Message from Levert FeinsteinJames M Granfortuna, MD sent at 06/09/2017  1:00 PM EDT ----- Call pt: ferritin coming down nicely: now 345. Liver tests slowly improving.

## 2017-06-13 NOTE — Telephone Encounter (Signed)
Pt asked about next appt - last seen in March.

## 2017-06-13 NOTE — Telephone Encounter (Signed)
Pt called / informed "ferritin coming down nicely: now 345. Liver tests slowly improving." per Dr Cyndie ChimeGranfortuna. Also pt stated she recently had a back injection and ordered to take 2 500 mg of Tylenol TID per Dr Alvester MorinNewton - wanted to know if this is too much/ I asked Dr Reece AgarG - stated since her liver test is still elevated, he recommends taking only 1 500 mg TID - pt informed.

## 2017-06-22 ENCOUNTER — Encounter (HOSPITAL_COMMUNITY): Admission: RE | Admit: 2017-06-22 | Payer: Medicare Other | Source: Ambulatory Visit

## 2017-06-24 ENCOUNTER — Other Ambulatory Visit: Payer: Self-pay | Admitting: Oncology

## 2017-06-24 ENCOUNTER — Telehealth: Payer: Self-pay | Admitting: *Deleted

## 2017-06-24 NOTE — Telephone Encounter (Signed)
Called pt - informed Dr Reece AgarG apologized for not calling. Pt prefers to stay on monthly schedule unless Dr Reece AgarG wants her back on every 2 weeks.

## 2017-06-24 NOTE — Telephone Encounter (Signed)
My error - her ferritin was coming down - I put in my last note I was going to decrease frequency of phlebotomy when she got less than 500.  I apologize for not calling her.  We can stay on every 2 weeks for now. I will have to change orders. Can you let [facility] short stay know so they can give her a new schedule please? Thx DrG

## 2017-06-24 NOTE — Telephone Encounter (Signed)
Call from pt - stated her phlebotomy appts were changed to once a month and no one had called her ; she found out when she looked at My Chart. Told her I had no idea about the change but I will ask Dr Reece AgarG.

## 2017-06-27 ENCOUNTER — Other Ambulatory Visit: Payer: Self-pay | Admitting: Oncology

## 2017-06-27 NOTE — Addendum Note (Signed)
Addended by: Levert FeinsteinGRANFORTUNA, Wania Longstreth M on: 06/27/2017 05:16 PM   Modules accepted: Orders

## 2017-06-27 NOTE — Progress Notes (Unsigned)
No visit. I just updated an order. Stuck in an EPIC [place]

## 2017-06-29 NOTE — Telephone Encounter (Signed)
I went and changed the orders back to every 2 weeks and notified [facility] short stay!!! Let's stay with the every 2 weeks for the next 4 weeks then I can change again.

## 2017-06-29 NOTE — Telephone Encounter (Signed)
OK - I will change orders back again.

## 2017-06-29 NOTE — Telephone Encounter (Signed)
Informed pt about going back to every 2 weeks for next 4 weeks for phlebotomies.Wants to know why - since last Ferritin is below 500. Stated she had called [facility] and changed the schedule back to every month; first one 8/27. Also she has planned 3 trips around the monthly schedule.

## 2017-06-30 NOTE — Telephone Encounter (Signed)
Pt called/ informed phlebotomies will be monthly per Dr Cyndie ChimeGranfortuna. Stated she has questions and would like to talk t Dr Reece AgarG. [phone].

## 2017-07-06 ENCOUNTER — Encounter (HOSPITAL_COMMUNITY): Payer: Medicare Other

## 2017-07-16 ENCOUNTER — Other Ambulatory Visit (INDEPENDENT_AMBULATORY_CARE_PROVIDER_SITE_OTHER): Payer: Self-pay | Admitting: Physical Medicine and Rehabilitation

## 2017-07-18 NOTE — Telephone Encounter (Signed)
Please advise 

## 2017-07-20 ENCOUNTER — Encounter (HOSPITAL_COMMUNITY): Payer: Medicare Other

## 2017-07-25 ENCOUNTER — Encounter (HOSPITAL_COMMUNITY): Payer: Self-pay

## 2017-07-25 ENCOUNTER — Encounter (HOSPITAL_COMMUNITY)
Admission: RE | Admit: 2017-07-25 | Discharge: 2017-07-25 | Disposition: A | Discharge status: Home / Self Care | Payer: Medicare Other | Source: Ambulatory Visit | Attending: Oncology | Admitting: Oncology

## 2017-07-25 LAB — CBC WITH DIFFERENTIAL/PLATELET
BASOS ABS: 0 10*3/uL (ref 0.0–0.1)
Basophils Relative: 0 %
EOS PCT: 1 %
Eosinophils Absolute: 0 10*3/uL (ref 0.0–0.7)
HCT: 33.4 % — ABNORMAL LOW (ref 36.0–46.0)
Hemoglobin: 11.5 g/dL — ABNORMAL LOW (ref 12.0–15.0)
LYMPHS PCT: 36 %
Lymphs Abs: 1.1 10*3/uL (ref 0.7–4.0)
MCH: 36.2 pg — ABNORMAL HIGH (ref 26.0–34.0)
MCHC: 34.4 g/dL (ref 30.0–36.0)
MCV: 105 fL — AB (ref 78.0–100.0)
Monocytes Absolute: 0.3 10*3/uL (ref 0.1–1.0)
Monocytes Relative: 10 %
NEUTROS ABS: 1.7 10*3/uL (ref 1.7–7.7)
Neutrophils Relative %: 53 %
PLATELETS: 176 10*3/uL (ref 150–400)
RBC: 3.18 MIL/uL — AB (ref 3.87–5.11)
RDW: 12.7 % (ref 11.5–15.5)
WBC: 3.2 10*3/uL — AB (ref 4.0–10.5)

## 2017-07-25 LAB — FERRITIN: Ferritin: 520 ng/mL — ABNORMAL HIGH (ref 11–307)

## 2017-07-25 MED ORDER — SODIUM CHLORIDE 0.9 % IV BOLUS (SEPSIS)
250.0000 mL | Freq: Once | INTRAVENOUS | Status: AC
  Administered 2017-07-25: 250 mL via INTRAVENOUS

## 2017-07-25 NOTE — Progress Notes (Signed)
Catherine Harrington presents today for phlebotomy per MD orders. HGB/HCT:  11.5/33.4 Phlebotomy procedure started at 11:14 AM and ended at 11:20 AM. 250  ml removed. Patient tolerated procedure well. IV needle removed intact.

## 2017-07-25 NOTE — Discharge Instructions (Signed)
Therapeutic Phlebotomy Therapeutic phlebotomy is the controlled removal of blood from a person's body for the purpose of treating a medical condition. The procedure is similar to donating blood. Usually, about a pint (470 mL, or 0.47L) of blood is removed. The average adult has 9-12 pints (4.3-5.7 L) of blood. Therapeutic phlebotomy may be used to treat the following medical conditions:  Hemochromatosis. This is a condition in which the blood contains too much iron.  Polycythemia vera. This is a condition in which the blood contains too many red blood cells.  Porphyria cutanea tarda. This is a disease in which an important part of hemoglobin is not made properly. It results in the buildup of abnormal amounts of porphyrins in the body.  Sickle cell disease. This is a condition in which the red blood cells form an abnormal crescent shape rather than a round shape.  Tell a health care provider about:  Any allergies you have.  All medicines you are taking, including vitamins, herbs, eye drops, creams, and over-the-counter medicines.  Any problems you or family members have had with anesthetic medicines.  Any blood disorders you have.  Any surgeries you have had.  Any medical conditions you have. What are the risks? Generally, this is a safe procedure. However, problems may occur, including:  Nausea or light-headedness.  Low blood pressure.  Soreness, bleeding, swelling, or bruising at the needle insertion site.  Infection.  What happens before the procedure?  Follow instructions from your health care provider about eating or drinking restrictions.  Ask your health care provider about changing or stopping your regular medicines. This is especially important if you are taking diabetes medicines or blood thinners.  Wear clothing with sleeves that can be raised above the elbow.  Plan to have someone take you home after the procedure.  You may have a blood sample taken. What  happens during the procedure?  A needle will be inserted into one of your veins.  Tubing and a collection bag will be attached to that needle.  Blood will flow through the needle and tubing into the collection bag.  You may be asked to open and close your hand slowly and continually during the entire collection.  After the specified amount of blood has been removed from your body, the collection bag and tubing will be clamped.  The needle will be removed from your vein.  Pressure will be held on the site of the needle insertion to stop the bleeding.  A bandage (dressing) will be placed over the needle insertion site. The procedure may vary among health care providers and hospitals. What happens after the procedure?  Your recovery will be assessed and monitored.  You can return to your normal activities as directed by your health care provider. This information is not intended to replace advice given to you by your health care provider. Make sure you discuss any questions you have with your health care provider. Document Released: 04/19/2011 Document Revised: 07/17/2016 Document Reviewed: 11/11/2014 Elsevier Interactive Patient Education  2018 [employer]vier Inc.  

## 2017-07-26 ENCOUNTER — Telehealth: Payer: Self-pay | Admitting: *Deleted

## 2017-07-26 NOTE — Telephone Encounter (Signed)
-----   Message from Levert Feinstein, MD sent at 07/26/2017  9:17 AM EDT ----- Call pt: ferritin up from 345 to 520  Recommend we go back on every 2 week phlebotomy for now

## 2017-07-26 NOTE — Telephone Encounter (Signed)
Pt called / informed "ferritin up from 345 to 520 Recommend we go back on every 2 week phlebotomy for now" per Dr Cyndie Chime. Stated she does not like this but she will.  I called [facility]; informed of changed -stated need new order from the doctor.  Also called pt back so she can call short stay to schedule the appts. Made awared Dr Reece Agar is not here this week. Pt's last phlebotomy was yesterday.

## 2017-08-02 ENCOUNTER — Other Ambulatory Visit: Payer: Self-pay | Admitting: Oncology

## 2017-08-02 NOTE — Telephone Encounter (Signed)
Pt's husband called / informed.

## 2017-08-02 NOTE — Telephone Encounter (Signed)
-----   Message from Levert FeinsteinJames M Granfortuna, MD sent at 08/02/2017 10:13 AM EDT ----- New orders placed; call pt: next phlebotomy on Monday 9/10 then Q 2 weeks; call [facility] short [facility] to tell them new orders in for every 2 week phlebotomy & schedule to start for next Monday Thx DrG ----- Message ----- From: Hassan BucklerPalmer, Ebunoluwa Gernert H, RN Sent: 08/02/2017   9:42 AM To: Levert FeinsteinJames M Granfortuna, MD  I called [facility]; informed of changed -stated need new order from the doctor Thanks [facility]

## 2017-08-02 NOTE — Telephone Encounter (Signed)
Yes - less blood and additional fluid ordered

## 2017-08-02 NOTE — Telephone Encounter (Signed)
Talked to pt's husband - pt is out of town, He stated she's aware of every 2 weeks schedule. He wanted to know if the order  has pt getting 30 mins of fluid with less blood taken off, which she tolerated better or going back to the old way.

## 2017-08-09 ENCOUNTER — Encounter (HOSPITAL_COMMUNITY): Payer: Self-pay

## 2017-08-09 ENCOUNTER — Encounter (HOSPITAL_COMMUNITY)
Admission: RE | Admit: 2017-08-09 | Discharge: 2017-08-09 | Disposition: A | Discharge status: Home / Self Care | Payer: Medicare Other | Source: Ambulatory Visit | Attending: Oncology | Admitting: Oncology

## 2017-08-09 LAB — HEMOGLOBIN AND HEMATOCRIT, BLOOD
HCT: 33.6 % — ABNORMAL LOW (ref 36.0–46.0)
HEMOGLOBIN: 11.5 g/dL — AB (ref 12.0–15.0)

## 2017-08-09 MED ORDER — SODIUM CHLORIDE 0.9 % IV BOLUS (SEPSIS)
250.0000 mL | Freq: Once | INTRAVENOUS | Status: AC
  Administered 2017-08-09: 250 mL via INTRAVENOUS

## 2017-08-09 NOTE — Progress Notes (Signed)
Catherine KailSusan Harrington presents today for phlebotomy per MD orders. HGB 11.5 / HCT 33.6 Phlebotomy procedure started at 1210 and ended at 1230. 300 cc removed. Patient tolerated procedure well. IV needle removed intact.

## 2017-08-10 ENCOUNTER — Encounter (HOSPITAL_COMMUNITY): Payer: Medicare Other

## 2017-08-24 ENCOUNTER — Encounter (HOSPITAL_COMMUNITY)
Admission: RE | Admit: 2017-08-24 | Discharge: 2017-08-24 | Disposition: A | Discharge status: Home / Self Care | Payer: Medicare Other | Source: Ambulatory Visit | Attending: Oncology | Admitting: Oncology

## 2017-08-24 ENCOUNTER — Encounter (HOSPITAL_COMMUNITY): Payer: Self-pay

## 2017-08-24 LAB — HEMOGLOBIN AND HEMATOCRIT, BLOOD
HEMATOCRIT: 31.6 % — AB (ref 36.0–46.0)
Hemoglobin: 10.8 g/dL — ABNORMAL LOW (ref 12.0–15.0)

## 2017-08-24 MED ORDER — SODIUM CHLORIDE 0.9 % IV BOLUS (SEPSIS): 250.0000 mL | Freq: Once | INTRAVENOUS | Status: DC

## 2017-08-24 NOTE — Progress Notes (Signed)
Phlebotomy unnecessary today, see lab results.

## 2017-08-29 ENCOUNTER — Other Ambulatory Visit: Payer: Self-pay | Admitting: Internal Medicine

## 2017-08-29 DIAGNOSIS — Z1231 Encounter for screening mammogram for malignant neoplasm of breast: Secondary | ICD-10-CM

## 2017-09-05 ENCOUNTER — Other Ambulatory Visit: Payer: Self-pay | Admitting: Oncology

## 2017-09-07 ENCOUNTER — Encounter (HOSPITAL_COMMUNITY): Payer: Medicare Other

## 2017-09-08 ENCOUNTER — Encounter (HOSPITAL_COMMUNITY)
Admission: RE | Admit: 2017-09-08 | Discharge: 2017-09-08 | Disposition: A | Discharge status: Home / Self Care | Payer: Medicare Other | Source: Ambulatory Visit | Attending: Oncology | Admitting: Oncology

## 2017-09-08 LAB — COMPREHENSIVE METABOLIC PANEL
ALT: 56 U/L — ABNORMAL HIGH (ref 14–54)
ANION GAP: 10 (ref 5–15)
AST: 100 U/L — ABNORMAL HIGH (ref 15–41)
Albumin: 4.2 g/dL (ref 3.5–5.0)
Alkaline Phosphatase: 89 U/L (ref 38–126)
BILIRUBIN TOTAL: 0.6 mg/dL (ref 0.3–1.2)
BUN: 13 mg/dL (ref 6–20)
CHLORIDE: 103 mmol/L (ref 101–111)
CO2: 22 mmol/L (ref 22–32)
Calcium: 9.1 mg/dL (ref 8.9–10.3)
Creatinine, Ser: 1.04 mg/dL — ABNORMAL HIGH (ref 0.44–1.00)
GFR, EST NON AFRICAN AMERICAN: 52 mL/min — AB (ref >60)
Glucose, Bld: 97 mg/dL (ref 65–99)
POTASSIUM: 4.3 mmol/L (ref 3.5–5.1)
Sodium: 135 mmol/L (ref 135–145)
TOTAL PROTEIN: 7.6 g/dL (ref 6.5–8.1)

## 2017-09-08 LAB — FERRITIN: FERRITIN: 362 ng/mL — AB (ref 11–307)

## 2017-09-08 LAB — HEMOGLOBIN AND HEMATOCRIT, BLOOD
HEMATOCRIT: 32.6 % — AB (ref 36.0–46.0)
HEMOGLOBIN: 11.3 g/dL — AB (ref 12.0–15.0)

## 2017-09-08 MED ORDER — SODIUM CHLORIDE 0.9 % IV SOLN
INTRAVENOUS | Status: DC
Start: 2017-09-08 — End: 2017-09-09
  Administered 2017-09-08: 12:00:00 via INTRAVENOUS

## 2017-09-08 NOTE — Progress Notes (Addendum)
Catherine Harrington presents today for phlebotomy per MD orders. HGB 11.3. Phlebotomy procedure started at 1205 and ended at 1215. 300 cc removed. Patient tolerated procedure well. IV needle removed intact.

## 2017-09-09 ENCOUNTER — Telehealth: Payer: Self-pay | Admitting: *Deleted

## 2017-09-09 NOTE — Telephone Encounter (Signed)
Pt called / informed "ferritin down to 362; liver tests about the same; kidney function good" per Dr Cyndie Chime. Next phlebotomy scheduled 10/30 at [facility].

## 2017-09-09 NOTE — Telephone Encounter (Signed)
-----   Message from Levert Feinstein, MD sent at 09/08/2017  5:13 PM EDT ----- Call pt: ferritin down to 362; liver tests about the same; kidney function good

## 2017-09-21 ENCOUNTER — Encounter (HOSPITAL_COMMUNITY): Payer: Medicare Other

## 2017-09-22 ENCOUNTER — Other Ambulatory Visit: Payer: Self-pay | Admitting: Internal Medicine

## 2017-09-22 MED ORDER — SODIUM CHLORIDE 0.9 % IJ SOLN: 250.0000 mL | Freq: Once | INTRAMUSCULAR | Status: DC

## 2017-09-22 NOTE — Progress Notes (Signed)
[facility] need to ordered under "Sign and Held" and add "Perform therapeutic phlebotomy" order. Thanks

## 2017-09-23 MED ORDER — SODIUM CHLORIDE 0.9 % IJ SOLN: 250.0000 mL | Freq: Once | INTRAMUSCULAR | Status: DC

## 2017-09-23 NOTE — Progress Notes (Signed)
Does this look right?

## 2017-09-27 ENCOUNTER — Other Ambulatory Visit: Payer: Self-pay | Admitting: Oncology

## 2017-09-27 ENCOUNTER — Encounter (HOSPITAL_COMMUNITY): Payer: Self-pay

## 2017-09-27 ENCOUNTER — Encounter (HOSPITAL_COMMUNITY)
Admission: RE | Admit: 2017-09-27 | Discharge: 2017-09-27 | Disposition: A | Discharge status: Home / Self Care | Payer: Medicare Other | Source: Ambulatory Visit | Attending: Oncology | Admitting: Oncology

## 2017-09-27 LAB — CBC WITH DIFFERENTIAL/PLATELET
Basophils Absolute: 0 10*3/uL (ref 0.0–0.1)
Basophils Relative: 0 %
Eosinophils Absolute: 0 10*3/uL (ref 0.0–0.7)
Eosinophils Relative: 0 %
HEMATOCRIT: 31.7 % — AB (ref 36.0–46.0)
HEMOGLOBIN: 10.9 g/dL — AB (ref 12.0–15.0)
LYMPHS ABS: 1.2 10*3/uL (ref 0.7–4.0)
Lymphocytes Relative: 36 %
MCH: 36.6 pg — AB (ref 26.0–34.0)
MCHC: 34.4 g/dL (ref 30.0–36.0)
MCV: 106.4 fL — AB (ref 78.0–100.0)
MONOS PCT: 12 %
Monocytes Absolute: 0.4 10*3/uL (ref 0.1–1.0)
NEUTROS ABS: 1.7 10*3/uL (ref 1.7–7.7)
NEUTROS PCT: 52 %
Platelets: 156 10*3/uL (ref 150–400)
RBC: 2.98 MIL/uL — ABNORMAL LOW (ref 3.87–5.11)
RDW: 13.6 % (ref 11.5–15.5)
WBC: 3.3 10*3/uL — ABNORMAL LOW (ref 4.0–10.5)

## 2017-09-27 LAB — COMPREHENSIVE METABOLIC PANEL
ALK PHOS: 79 U/L (ref 38–126)
ALT: 58 U/L — ABNORMAL HIGH (ref 14–54)
AST: 101 U/L — ABNORMAL HIGH (ref 15–41)
Albumin: 4 g/dL (ref 3.5–5.0)
Anion gap: 10 (ref 5–15)
BILIRUBIN TOTAL: 0.9 mg/dL (ref 0.3–1.2)
BUN: 13 mg/dL (ref 6–20)
CHLORIDE: 102 mmol/L (ref 101–111)
CO2: 23 mmol/L (ref 22–32)
Calcium: 9.1 mg/dL (ref 8.9–10.3)
Creatinine, Ser: 1.05 mg/dL — ABNORMAL HIGH (ref 0.44–1.00)
GFR calc non Af Amer: 52 mL/min — ABNORMAL LOW (ref >60)
GFR, EST AFRICAN AMERICAN: 60 mL/min — AB (ref >60)
GLUCOSE: 98 mg/dL (ref 65–99)
POTASSIUM: 4.4 mmol/L (ref 3.5–5.1)
Sodium: 135 mmol/L (ref 135–145)
Total Protein: 7.5 g/dL (ref 6.5–8.1)

## 2017-09-27 LAB — LIPID PANEL
CHOL/HDL RATIO: 1.8 ratio
Cholesterol: 209 mg/dL — ABNORMAL HIGH (ref 0–200)
HDL: 114 mg/dL (ref >40)
LDL Cholesterol: 79 mg/dL (ref 0–99)
Triglycerides: 80 mg/dL (ref <150)
VLDL: 16 mg/dL (ref 0–40)

## 2017-09-27 NOTE — Progress Notes (Signed)
Patient's hemoglobin is 10.9 so per order does NOT need therapeutic phlebotomy today. Patient and husband made aware. Patient aware of when next appointment is.

## 2017-09-27 NOTE — Progress Notes (Signed)
Patient here for blood draw and possible therapeutic phlebotomy (depending on hgb). Patient brought prescription from her medical doctor (Dr. Nila NephewEdwin Green) for cbc w/ diff, CMP, Lipid profile to also be drawn today. Patient is a difficult stick and preferred to have all blood drawn at one time today. Awaiting hgb results now.

## 2017-10-05 ENCOUNTER — Encounter (HOSPITAL_COMMUNITY): Payer: Medicare Other

## 2017-10-07 ENCOUNTER — Other Ambulatory Visit (INDEPENDENT_AMBULATORY_CARE_PROVIDER_SITE_OTHER): Payer: Self-pay | Admitting: Physical Medicine and Rehabilitation

## 2017-10-07 ENCOUNTER — Encounter (HOSPITAL_COMMUNITY): Admission: RE | Admit: 2017-10-07 | Payer: Medicare Other | Source: Ambulatory Visit

## 2017-10-07 ENCOUNTER — Telehealth (INDEPENDENT_AMBULATORY_CARE_PROVIDER_SITE_OTHER): Payer: Self-pay | Admitting: Physical Medicine and Rehabilitation

## 2017-10-07 MED ORDER — DIAZEPAM 5 MG PO TABS: ORAL_TABLET | ORAL | 0 refills | Status: DC

## 2017-10-07 NOTE — Progress Notes (Signed)
Valium rx 5mg  for pre-procedure anxiety

## 2017-10-07 NOTE — Telephone Encounter (Signed)
Printed rx  

## 2017-10-07 NOTE — Telephone Encounter (Signed)
Called in to patient's pharmacy. 

## 2017-10-07 NOTE — Telephone Encounter (Signed)
Yes okay 

## 2017-10-07 NOTE — Telephone Encounter (Signed)
Scheduled for 11/03/17 at 1330. Patient would like valium x2 pre. [facility] [place].

## 2017-10-11 ENCOUNTER — Encounter (HOSPITAL_COMMUNITY)
Admission: RE | Admit: 2017-10-11 | Discharge: 2017-10-11 | Disposition: A | Discharge status: Home / Self Care | Payer: Medicare Other | Source: Ambulatory Visit | Attending: Oncology | Admitting: Oncology

## 2017-10-11 ENCOUNTER — Other Ambulatory Visit: Payer: Self-pay | Admitting: Oncology

## 2017-10-11 ENCOUNTER — Encounter (HOSPITAL_COMMUNITY): Payer: Self-pay

## 2017-10-11 LAB — HEMOGLOBIN AND HEMATOCRIT, BLOOD
HEMATOCRIT: 31 % — AB (ref 36.0–46.0)
Hemoglobin: 10.5 g/dL — ABNORMAL LOW (ref 12.0–15.0)

## 2017-10-11 LAB — TSH: TSH: 1.408 u[IU]/mL (ref 0.350–4.500)

## 2017-10-11 LAB — FERRITIN: Ferritin: 278 ng/mL (ref 11–307)

## 2017-10-11 MED ORDER — SODIUM CHLORIDE 0.9 % IV SOLN
Freq: Once | INTRAVENOUS | Status: AC
  Administered 2017-10-11: 14:00:00 via INTRAVENOUS

## 2017-10-11 NOTE — Progress Notes (Signed)
Called Dr Cyndie ChimeGranfortuna as family states they should be getting ferritin level and IV fluids prior to phlebotomy. Glenda RN is to call back with orders if he would like these to be done.

## 2017-10-11 NOTE — Progress Notes (Signed)
hgb is 10.5 today.  Phlebotomy will not be done.  Iv was d/c, pressure dressing applied, and called pt's husband to come get pt.

## 2017-10-12 ENCOUNTER — Ambulatory Visit
Admission: RE | Admit: 2017-10-12 | Discharge: 2017-10-12 | Disposition: A | Discharge status: Home / Self Care | Payer: Medicare Other | Source: Ambulatory Visit | Attending: Internal Medicine | Admitting: Internal Medicine

## 2017-10-12 DIAGNOSIS — Z1231 Encounter for screening mammogram for malignant neoplasm of breast: Secondary | ICD-10-CM

## 2017-10-12 LAB — T4: T4, Total: 7.9 ug/dL (ref 4.5–12.0)

## 2017-10-13 ENCOUNTER — Telehealth: Payer: Self-pay | Admitting: *Deleted

## 2017-10-13 NOTE — Telephone Encounter (Signed)
Pt called / informed "ferritin down to 278" per Dr Cyndie ChimeGranfortuna. Stated she had already seen it on My Chart. Wants to know if she needs to continue every 2 weeks phlebotomy - next one scheduled on the 27th.

## 2017-10-13 NOTE — Telephone Encounter (Signed)
-----   Message from Levert FeinsteinJames M Granfortuna, MD sent at 10/11/2017  4:48 PM EST ----- Call pt: ferritin down to 278

## 2017-10-14 NOTE — Telephone Encounter (Signed)
Keep appt for the one on the 27th since they did not do one last week we will then change to once a month - [facility]

## 2017-10-14 NOTE — Telephone Encounter (Signed)
Called pt - not @ home; talked to her husband, informed to keep 11/27 phlebotomy appt then change to once a month. Stated he will tell her; wants to be sure fluids are ordered.

## 2017-10-25 ENCOUNTER — Encounter (HOSPITAL_COMMUNITY): Payer: Self-pay

## 2017-10-25 ENCOUNTER — Encounter (HOSPITAL_COMMUNITY)
Admission: RE | Admit: 2017-10-25 | Discharge: 2017-10-25 | Disposition: A | Discharge status: Home / Self Care | Payer: Medicare Other | Source: Ambulatory Visit | Attending: Oncology | Admitting: Oncology

## 2017-10-25 LAB — HEMOGLOBIN AND HEMATOCRIT, BLOOD
HCT: 33.8 % — ABNORMAL LOW (ref 36.0–46.0)
HEMOGLOBIN: 11.5 g/dL — AB (ref 12.0–15.0)

## 2017-10-25 MED ORDER — LIDOCAINE HCL 2 % IJ SOLN
0.1000 mL | Freq: Every day | INTRAMUSCULAR | Status: DC
  Administered 2017-10-25: 2 mg via INTRADERMAL

## 2017-10-25 MED ORDER — SODIUM CHLORIDE 0.9 % IV SOLN
INTRAVENOUS | Status: DC
  Administered 2017-10-25: 12:00:00 via INTRAVENOUS

## 2017-10-25 NOTE — Progress Notes (Signed)
Catherine KailSusan Harrington presents today for phlebotomy per MD orders. 11.5 / 33.8 Phlebotomy procedure started at 1210 and ended at 1230 . 300cc removed. Patient tolerated procedure well. IV needle removed intact. Husband escorted patient to exit via w/c. Aware of next appt in one month.

## 2017-10-25 NOTE — Discharge Instructions (Signed)
Therapeutic Phlebotomy Therapeutic phlebotomy is the controlled removal of blood from a person's body for the purpose of treating a medical condition. The procedure is similar to donating blood. Usually, about a pint (470 mL, or 0.47L) of blood is removed. The average adult has 9-12 pints (4.3-5.7 L) of blood. Therapeutic phlebotomy may be used to treat the following medical conditions:  Hemochromatosis. This is a condition in which the blood contains too much iron.  Polycythemia vera. This is a condition in which the blood contains too many red blood cells.  Porphyria cutanea tarda. This is a disease in which an important part of hemoglobin is not made properly. It results in the buildup of abnormal amounts of porphyrins in the body.  Sickle cell disease. This is a condition in which the red blood cells form an abnormal crescent shape rather than a round shape.  Tell a health care provider about:  Any allergies you have.  All medicines you are taking, including vitamins, herbs, eye drops, creams, and over-the-counter medicines.  Any problems you or family members have had with anesthetic medicines.  Any blood disorders you have.  Any surgeries you have had.  Any medical conditions you have. What are the risks? Generally, this is a safe procedure. However, problems may occur, including:  Nausea or light-headedness.  Low blood pressure.  Soreness, bleeding, swelling, or bruising at the needle insertion site.  Infection.  What happens before the procedure?  Follow instructions from your health care provider about eating or drinking restrictions.  Ask your health care provider about changing or stopping your regular medicines. This is especially important if you are taking diabetes medicines or blood thinners.  Wear clothing with sleeves that can be raised above the elbow.  Plan to have someone take you home after the procedure.  You may have a blood sample taken. What  happens during the procedure?  A needle will be inserted into one of your veins.  Tubing and a collection bag will be attached to that needle.  Blood will flow through the needle and tubing into the collection bag.  You may be asked to open and close your hand slowly and continually during the entire collection.  After the specified amount of blood has been removed from your body, the collection bag and tubing will be clamped.  The needle will be removed from your vein.  Pressure will be held on the site of the needle insertion to stop the bleeding.  A bandage (dressing) will be placed over the needle insertion site. The procedure may vary among health care providers and hospitals. What happens after the procedure?  Your recovery will be assessed and monitored.  You can return to your normal activities as directed by your health care provider. This information is not intended to replace advice given to you by your health care provider. Make sure you discuss any questions you have with your health care provider. Document Released: 04/19/2011 Document Revised: 07/17/2016 Document Reviewed: 11/11/2014 Elsevier Interactive Patient Education  2018 [employer]vier Inc.  

## 2017-10-25 NOTE — Progress Notes (Addendum)
Patient in short stay for blood draw and therapeutic phlebotomy. Her hgb was 11.5 therefore per order to draw off 300ml. Patient very nervous and requests numbing medicine (lidocaine injection prior) to numb site and to have the NS IVF prior to phlebotomy. Called and spoke to Dr. Cyndie ChimeGranfortuna and orders received for lidocaine/fluid first.   Dr. Cyndie ChimeGranfortuna stated for patient to have a monthly appt - patient and husband made aware. D/c information given to patient with next appt Jan. 2.

## 2017-10-26 ENCOUNTER — Other Ambulatory Visit: Payer: Self-pay | Admitting: Oncology

## 2017-10-27 NOTE — Addendum Note (Signed)
Addended by: Remus BlakeBARROW, Romaine Neville K on: 10/27/2017 04:08 PM   Modules accepted: Orders

## 2017-10-28 ENCOUNTER — Encounter (HOSPITAL_COMMUNITY): Admission: RE | Admit: 2017-10-28 | Payer: Medicare Other | Source: Ambulatory Visit

## 2017-10-28 ENCOUNTER — Encounter (HOSPITAL_COMMUNITY): Payer: Medicare Other

## 2017-11-03 ENCOUNTER — Encounter (INDEPENDENT_AMBULATORY_CARE_PROVIDER_SITE_OTHER): Payer: Self-pay | Admitting: Physical Medicine and Rehabilitation

## 2017-11-03 ENCOUNTER — Ambulatory Visit (INDEPENDENT_AMBULATORY_CARE_PROVIDER_SITE_OTHER): Payer: Medicare Other

## 2017-11-03 ENCOUNTER — Ambulatory Visit (INDEPENDENT_AMBULATORY_CARE_PROVIDER_SITE_OTHER): Payer: Medicare Other | Admitting: Physical Medicine and Rehabilitation

## 2017-11-03 VITALS — BP 116/61 | HR 94 | Temp 98.0°F

## 2017-11-03 DIAGNOSIS — M4125 Other idiopathic scoliosis, thoracolumbar region: Secondary | ICD-10-CM

## 2017-11-03 DIAGNOSIS — M5416 Radiculopathy, lumbar region: Secondary | ICD-10-CM

## 2017-11-03 DIAGNOSIS — M7061 Trochanteric bursitis, right hip: Secondary | ICD-10-CM | POA: Diagnosis not present

## 2017-11-03 DIAGNOSIS — M545 Low back pain, unspecified: Secondary | ICD-10-CM

## 2017-11-03 DIAGNOSIS — G8929 Other chronic pain: Secondary | ICD-10-CM | POA: Diagnosis not present

## 2017-11-03 DIAGNOSIS — M609 Myositis, unspecified: Secondary | ICD-10-CM

## 2017-11-03 MED ORDER — LIDOCAINE HCL (PF) 1 % IJ SOLN: 2.0000 mL | Freq: Once | INTRAMUSCULAR | Status: DC

## 2017-11-03 MED ORDER — METHYLPREDNISOLONE ACETATE 80 MG/ML IJ SUSP: 80.0000 mg | Freq: Once | INTRAMUSCULAR | Status: DC

## 2017-11-03 NOTE — Patient Instructions (Signed)

## 2017-11-03 NOTE — Progress Notes (Signed)
Has one spot on right side of lower back which really hurts. She said this really hurts when standing or doing a lot of walking. She leans on cart when shopping for groceries.  Also says she has a knot on the outside of her right hip which she says is very painful. She said it hurts to lie on this side.

## 2017-11-08 ENCOUNTER — Encounter (HOSPITAL_COMMUNITY): Payer: Medicare Other

## 2017-11-10 ENCOUNTER — Encounter (INDEPENDENT_AMBULATORY_CARE_PROVIDER_SITE_OTHER): Payer: Self-pay | Admitting: Physical Medicine and Rehabilitation

## 2017-11-10 DIAGNOSIS — M609 Myositis, unspecified: Secondary | ICD-10-CM

## 2017-11-10 MED ORDER — LIDOCAINE HCL (PF) 1 % IJ SOLN
3.0000 mL | INTRAMUSCULAR | Status: AC | PRN
  Administered 2017-11-10: 3 mL

## 2017-11-10 MED ORDER — TRIAMCINOLONE ACETONIDE 40 MG/ML IJ SUSP
20.0000 mg | INTRAMUSCULAR | Status: AC | PRN
Start: 2017-11-10 — End: 2017-11-10
  Administered 2017-11-10: 20 mg via INTRAMUSCULAR

## 2017-11-10 NOTE — Progress Notes (Signed)
Catherine KailSusan Harrington - 71 y.o. female MRN [id number]  Date of birth: 09/11/1946  Office Visit Note: Visit Date: 11/03/2017 PCP: Nila NephewGreen, Edwin, MD Referred by: Nila NephewGreen, Edwin, MD  Subjective: Chief Complaint  Patient presents with  . Lower Back - Pain  . Right Hip - Pain   HPI: [name] is a 71 year old female who returns today for evaluation and management of multiple complaints of musculoskeletal pain which are multifactorial.  She has a history of discoid lupus as well as arthritis and she has significant scoliosis of the lumbar spine.  We have completed epidural injections in the past for more hip and leg pain and she is done fairly well with those in the past.  The last time we saw her was in July and we completed transforaminal epidural injection that did seem to help her leg pain.  She has levels of foraminal stenosis but no central canal stenosis.  Her biggest complaint today is the pain in the right buttock region.  This is been a combination of myofascial pain as well as probable nerve root irritation may be from the foraminal narrowing.  The pain is problematic when she tries to walk.  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  This is treated by Dr. Cyndie ChimeGranfortuna.  She has been undergoing phlebotomy.  Those have been reduced recently as her levels have come down quite a bit.  She has lost significant weight and really did not have much weight to lose.  She is a very petite female.  Her back pain history is also complicated by 48-pack-year history of tobacco use and current every day smoker.  She has had physical therapy in the past and continues to try to stay active but is finding this more difficult as she goes along.  They do have a trip planned coming up.  She denies any radicular pain today down the legs.  She reports significant back pain particularly this pain in the right buttock region.  Is a secondary issue she complains of a physical "knot "on the  right lateral leg around the greater trochanter.  She reports as being painful to lie on the side but otherwise is not painful and does not radiate.  We have examined this before and it seems to be a lipoma or some small superficial ganglion.  She is actually going to see her dermatologist within the next week.  She is going to talk to them about the knot.  She does feel weak in general but no focal weakness.  Again she is finding it more difficult to walk any distances.    Review of Systems  Constitutional: Positive for malaise/fatigue and weight loss. Negative for chills and fever.  HENT: Negative for hearing loss and sinus pain.   Eyes: Negative for blurred vision, double vision and photophobia.  Respiratory: Negative for cough and shortness of breath.   Cardiovascular: Negative for chest pain, palpitations and leg swelling.  Gastrointestinal: Negative for abdominal pain, nausea and vomiting.  Genitourinary: Negative for flank pain.  Musculoskeletal: Positive for back pain and joint pain. Negative for myalgias.  Skin: Negative for itching and rash.  Neurological: Negative for tremors, focal weakness and weakness.  Endo/Heme/Allergies: Negative.   Psychiatric/Behavioral: Negative for depression.  All other systems reviewed and are negative.  Otherwise per HPI.  Assessment & Plan: Visit Diagnoses:  1. Lumbar radiculopathy   2. Other idiopathic scoliosis, thoracolumbar region   3. Greater trochanteric bursitis, right  4. Myofascitis   5. Chronic right-sided low back pain without sciatica     Plan: Findings:  Chronic history of axial low back pain with some radicular complaints at times secondary to combination of scoliosis and foraminal stenosis but without central canal stenosis.  She has had MRI completed recently in June of this year with these results.  Scoliosis is not been that progressive but it is something to watch.  The apex is at L3.  Continues to have difficulty with  walking.  She has had therapy and tries to maintain some strength.  Her case is complicated by other medical conditions including discoid lupus and anemia and hemochromatosis.  Her biggest complaint today was the buttock pain.  There is an area of tenderness and taut muscle band here.  I completed a trigger point injection in this area sometime ago and seemed to really flare the symptoms up.  If physical therapy seem to be able to do dry needling that did help without flaring it up.  We discussed this at length today with her upcoming trip and we did decide to place a localized small amount of corticosteroid into this trigger point.  This was done and documented below.  We also discussed epidural injection repeat.  We will see how she does with the muscle injection call us back and we would look at repeating the transforaminal injection for the foraminal narrowing.  We also discussed the small knot around the greater trochanter.  This seems to be may be a small ganglion or lipoma hard but it is mobile.  This area is likely also the area of bursa injections over the years have been performed by other practitioners.  There is no induration or swelling around it.  It is painful to touch.  I would like to have her talk to her dermatologist about this.  It may be something they could remove or have a general surgeon remove.  For now she can use Voltaren gel or she can use lidocaine patches both of which she has.    Meds & Orders:  Meds ordered this encounter  Medications  . DISCONTD: lidocaine (PF) (XYLOCAINE) 1 % injection 2 mL  . DISCONTD: methylPREDNISolone acetate (DEPO-MEDROL) injection 80 mg    Orders Placed This Encounter  Procedures  . Trigger Point Inj    Follow-up: Return if symptoms worsen or fail to improve.   Procedures: Trigger Point Inj Date/Time: 11/10/2017 6:19 AM Performed by: Tyrell Antonio, MD Authorized by: Tyrell Antonio, MD   Consent Given by:  Patient Site marked: the  procedure site was marked   Timeout: prior to procedure the correct patient, procedure, and site was verified   Indications:  Pain Total # of Trigger Points:  1 Location: back   Needle Size:  25 G Approach:  Dorsal Medications #1:  20 mg triamcinolone acetonide 40 MG/ML; 3 mL lidocaine (PF) 1 % Patient tolerance:  Patient tolerated the procedure well with no immediate complications Comments: Trigger point was palpated in the right gluteus medius.    No notes on file   Clinical History: MRI LUMBAR SPINE WITHOUT CONTRAST 05/09/2017  TECHNIQUE: Multiplanar, multisequence MR imaging of the lumbar spine was performed. No intravenous contrast was administered.  COMPARISON:  Lumbar spine MRI 06/11/2014  FINDINGS: Segmentation:  Normal  Alignment:  There is right convex lumbar scoliosis with apex at L3.  Vertebrae: Bone marrow signal is heterogeneous, unchanged. No evidence of discitis osteomyelitis. No acute compression fracture.  Conus medullaris: Extends  to the L1 level and appears normal.  Paraspinal and other soft tissues: There is fatty atrophy of the posterior paraspinous muscles, slightly worse on the left.  Disc levels:  T9-T12: These levels are assessed on sagittal sequences only. No disc herniation, spinal canal stenosis or neural foraminal narrowing.  T12-L1: Normal disc space and facets. No spinal canal or neuroforaminal stenosis.  L1-L2: Severe disc space narrowing with minimal bulge. Moderate right and mild left neural foraminal narrowing, primarily caused by scoliotic curvature.  L2-L3: Severe disc space loss with endplate osteophytosis. No spinal canal stenosis. Moderate right and severe left neural foraminal stenosis, unchanged. Left-greater-than-right facet arthrosis.  L3-L4: Severe disc space narrowing. No spinal canal stenosis. There is unchanged severe left foraminal stenosis. Right neural foramen is widely patent.  L4-L5: Disc  space narrowing with central disc protrusion, unchanged. Severe bilateral facet hypertrophy, also unchanged. No central spinal canal stenosis. Severe right and moderate left neural foraminal stenosis, unchanged.  L5-S1: Moderate bilateral facet hypertrophy but no stenosis.  Visualized sacrum: Normal.  IMPRESSION: 1. Unchanged appearance of marked lumbar dextroscoliosis, which, in combination degenerative disc disease and facet arthrosis, that leads to severe neural foraminal narrowing at multiple levels, including L2-3, L3-4 and L4-5. These findings are unchanged, however, compared to the MRI of 06/11/2014. 2. No central spinal canal stenosis of the lumbar spine. 3. Multilevel moderate to severe facet arthrosis, which may serve as a source of localized back pain.  Lspine MRI 06/11/14 There is moderate lumbar dextroscoliosis with apex at L3. There is left lateral listhesis of L2 on L3 and right lateral listhesis of L3 on L4 and L4 on L5. Bone marrow signal is somewhat heterogeneous diffusely and overall decreased in signal in the lower thoracic spine (possibly reflecting patient's history of anemia) with prominent fatty degenerative marrow changes in the mid and lower lumbar spine. Modic type 1 degenerative marrow changes are also noted in this region. There is lateral recess narrowing at L2-3 on the right.  She reports that she has been smoking cigarettes.  She has a 48.00 pack-year smoking history. she has never used smokeless tobacco. No results for input(s): HGBA1C, LABURIC in the last 8760 hours.  Objective:  VS:  HT:    WT:   BMI:     BP:116/61  HR:94bpm  TEMP:98 F (36.7 C)(Oral)  RESP:99 % Physical Exam  Ortho Exam Imaging: No results found.  Past Medical/Family/Surgical/Social History: Medications & Allergies reviewed per EMR Patient Active Problem List   Diagnosis Date Noted  . Macrocytic anemia 06/29/2016  . Chronic renal insufficiency, stage III  (moderate) (HCC) 06/29/2016  . Acute right flank pain 06/29/2016  . Chronic renal insufficiency 06/29/2016  . Hemochromatosis 05/18/2016  . 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 Macrocytosis 08/16/2013  . Syncope 08/16/2013  . DIVERTICULOSIS, COLON 11/04/2010   Past Medical History:  Diagnosis Date  . Acute right flank pain 06/29/2016  . Arthritis    "related to the lupus; joints" (08/16/2013)  . Chronic renal insufficiency, stage III (moderate) (HCC) 06/29/2016  . 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 Family history of anesthesia complication    "daughter doesn't wake up easy from it" (08/16/2013)  . 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 Hypertension   . Iron deficiency anemia   . Lumbar disc disease   . Lupus    "just the rash and arthritis" (08/16/2013)  . Macrocytic anemia 06/29/2016  . 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  Scoliosis   . Syncope and collapse    "lost consciousness ~ 4-5 seconds; didn't hurt myself" (08/16/2013)   Family History  Problem Relation Age of Onset  . Hypertension Mother   . Hypertension Father    Past Surgical History:  Procedure Laterality Date  . CESAREAN SECTION  1967; 1971  . MOUTH SURGERY  2013   " infection under bridge cleaned out, etc" (08/16/2013)  . TUBAL LIGATION     Social History   Occupational History  . Occupation: retired  Tobacco Use  . Smoking status: Current Every Day Smoker    Packs/day: 1.00    Years: 48.00    Pack years: 48.00    Types: Cigarettes  . Smokeless tobacco: Never Used  Substance and Sexual Activity  . Alcohol use: Yes    Alcohol/week: 12.6 oz    Types: 21 Cans of beer per week    Comment:  3-4 drinks daily sometimes.  . Drug use: No  . Sexual activity: Not Currently    Birth control/protection: Post-menopausal

## 2017-11-11 ENCOUNTER — Encounter (HOSPITAL_COMMUNITY): Payer: Medicare Other

## 2017-11-15 ENCOUNTER — Encounter (HOSPITAL_COMMUNITY): Payer: Medicare Other

## 2017-11-18 ENCOUNTER — Encounter (HOSPITAL_COMMUNITY): Payer: Medicare Other

## 2017-11-30 ENCOUNTER — Encounter (HOSPITAL_COMMUNITY): Payer: Medicare Other

## 2017-12-13 ENCOUNTER — Encounter (HOSPITAL_COMMUNITY): Payer: Medicare Other

## 2017-12-13 ENCOUNTER — Encounter (HOSPITAL_COMMUNITY)
Admission: RE | Admit: 2017-12-13 | Discharge: 2017-12-13 | Disposition: A | Discharge status: Home / Self Care | Payer: Medicare Other | Source: Ambulatory Visit | Attending: Oncology | Admitting: Oncology

## 2017-12-13 ENCOUNTER — Encounter (HOSPITAL_COMMUNITY): Payer: Self-pay

## 2017-12-13 LAB — HEMOGLOBIN AND HEMATOCRIT, BLOOD
HEMATOCRIT: 35.9 % — AB (ref 36.0–46.0)
Hemoglobin: 12.4 g/dL (ref 12.0–15.0)

## 2017-12-13 LAB — FERRITIN: Ferritin: 254 ng/mL (ref 11–307)

## 2017-12-13 MED ORDER — SODIUM CHLORIDE 0.9 % IV SOLN
INTRAVENOUS | Status: DC
  Administered 2017-12-13: 11:00:00 via INTRAVENOUS

## 2017-12-13 MED ORDER — LIDOCAINE HCL 2 % IJ SOLN
0.1000 mL | Freq: Every day | INTRAMUSCULAR | Status: DC
  Filled 2017-12-13 (×2): qty 10

## 2017-12-13 NOTE — Discharge Instructions (Signed)

## 2017-12-13 NOTE — Progress Notes (Signed)
HGB- 12.4, HCT- 35.9,  Phlebotomy started at 1040, completed at 1101, 300 ml removed, VSS,  Patient tolerated well.  NS 250 ml given per order IV. Patient discharged to home with husband.

## 2017-12-15 ENCOUNTER — Other Ambulatory Visit: Payer: Self-pay | Admitting: *Deleted

## 2017-12-15 MED ORDER — LORAZEPAM 1 MG PO TABS: 1.0000 mg | ORAL_TABLET | Freq: Three times a day (TID) | ORAL | 0 refills | Status: DC | PRN

## 2017-12-15 NOTE — Telephone Encounter (Signed)
Pt called / informed " ferritin now in normal range @ 254! I would like to get her down below 150. Continue phlebotomy program. Can decrease frequency soon. " per Dr Lamar SprinklesGranforrtuna. She had seen results via My Chart. Also needs refill on Lorazepam.

## 2017-12-15 NOTE — Telephone Encounter (Signed)
-----   Message from Levert FeinsteinJames M Granfortuna, MD sent at 12/14/2017  7:29 AM EST ----- Call pt: ferritin now in normal range @ 254! I would like to get her down below 150. Continue phlebotomy program. Can decrease frequency soon.

## 2017-12-15 NOTE — Telephone Encounter (Signed)
Lorazepam rx called to [facility] pharmacy. 

## 2018-01-04 ENCOUNTER — Encounter (HOSPITAL_COMMUNITY): Payer: Medicare Other

## 2018-01-11 ENCOUNTER — Encounter (HOSPITAL_COMMUNITY): Payer: Medicare Other

## 2018-01-11 ENCOUNTER — Other Ambulatory Visit: Payer: Self-pay | Admitting: Oncology

## 2018-01-17 ENCOUNTER — Encounter (HOSPITAL_COMMUNITY): Payer: Self-pay

## 2018-01-17 ENCOUNTER — Encounter (HOSPITAL_COMMUNITY)
Admission: RE | Admit: 2018-01-17 | Discharge: 2018-01-17 | Disposition: A | Discharge status: Home / Self Care | Payer: Medicare Other | Source: Ambulatory Visit | Attending: Oncology | Admitting: Oncology

## 2018-01-17 LAB — HEMOGLOBIN AND HEMATOCRIT, BLOOD
HEMATOCRIT: 34 % — AB (ref 36.0–46.0)
Hemoglobin: 11.5 g/dL — ABNORMAL LOW (ref 12.0–15.0)

## 2018-01-17 MED ORDER — LIDOCAINE HCL (PF) 2 % IJ SOLN: 0.5000 mL | Freq: Once | INTRAMUSCULAR | Status: DC

## 2018-01-17 MED ORDER — SODIUM CHLORIDE FLUSH 0.9 % IV SOLN
INTRAVENOUS | Status: AC
  Filled 2018-01-17: qty 60

## 2018-01-17 NOTE — Progress Notes (Signed)
Leanor KailSusan Pratt presents today for phlebotomy per MD orders. HGB/HCT 11.5/ HCT 34 Phlebotomy procedure started at 1440 and ended at 1500. 300 cc removed. Patient tolerated procedure well. IV needle removed intact.

## 2018-01-17 NOTE — Discharge Instructions (Signed)
Therapeutic Phlebotomy Therapeutic phlebotomy is the controlled removal of blood from a person's body for the purpose of treating a medical condition. The procedure is similar to donating blood. Usually, about a pint (470 mL, or 0.47L) of blood is removed. The average adult has 9-12 pints (4.3-5.7 L) of blood. Therapeutic phlebotomy may be used to treat the following medical conditions:  Hemochromatosis. This is a condition in which the blood contains too much iron.  Polycythemia vera. This is a condition in which the blood contains too many red blood cells.  Porphyria cutanea tarda. This is a disease in which an important part of hemoglobin is not made properly. It results in the buildup of abnormal amounts of porphyrins in the body.  Sickle cell disease. This is a condition in which the red blood cells form an abnormal crescent shape rather than a round shape.  Tell a health care provider about:  Any allergies you have.  All medicines you are taking, including vitamins, herbs, eye drops, creams, and over-the-counter medicines.  Any problems you or family members have had with anesthetic medicines.  Any blood disorders you have.  Any surgeries you have had.  Any medical conditions you have. What are the risks? Generally, this is a safe procedure. However, problems may occur, including:  Nausea or light-headedness.  Low blood pressure.  Soreness, bleeding, swelling, or bruising at the needle insertion site.  Infection.  What happens before the procedure?  Follow instructions from your health care provider about eating or drinking restrictions.  Ask your health care provider about changing or stopping your regular medicines. This is especially important if you are taking diabetes medicines or blood thinners.  Wear clothing with sleeves that can be raised above the elbow.  Plan to have someone take you home after the procedure.  You may have a blood sample taken. What  happens during the procedure?  A needle will be inserted into one of your veins.  Tubing and a collection bag will be attached to that needle.  Blood will flow through the needle and tubing into the collection bag.  You may be asked to open and close your hand slowly and continually during the entire collection.  After the specified amount of blood has been removed from your body, the collection bag and tubing will be clamped.  The needle will be removed from your vein.  Pressure will be held on the site of the needle insertion to stop the bleeding.  A bandage (dressing) will be placed over the needle insertion site. The procedure may vary among health care providers and hospitals. What happens after the procedure?  Your recovery will be assessed and monitored.  You can return to your normal activities as directed by your health care provider. This information is not intended to replace advice given to you by your health care provider. Make sure you discuss any questions you have with your health care provider. Document Released: 04/19/2011 Document Revised: 07/17/2016 Document Reviewed: 11/11/2014 Elsevier Interactive Patient Education  2018 [employer]vier Inc.  

## 2018-02-01 ENCOUNTER — Encounter (HOSPITAL_COMMUNITY): Payer: Medicare Other

## 2018-02-03 ENCOUNTER — Other Ambulatory Visit: Payer: Self-pay | Admitting: Oncology

## 2018-02-03 ENCOUNTER — Other Ambulatory Visit (HOSPITAL_COMMUNITY): Payer: Self-pay | Admitting: General Practice

## 2018-02-06 ENCOUNTER — Other Ambulatory Visit (HOSPITAL_COMMUNITY): Payer: Self-pay | Admitting: General Practice

## 2018-02-10 ENCOUNTER — Encounter (HOSPITAL_COMMUNITY): Payer: Self-pay

## 2018-02-10 ENCOUNTER — Encounter (HOSPITAL_COMMUNITY)
Admission: RE | Admit: 2018-02-10 | Discharge: 2018-02-10 | Disposition: A | Discharge status: Home / Self Care | Payer: Medicare Other | Source: Ambulatory Visit | Attending: Oncology | Admitting: Oncology

## 2018-02-10 LAB — FERRITIN: Ferritin: 181 ng/mL (ref 11–307)

## 2018-02-10 LAB — HEMOGLOBIN AND HEMATOCRIT, BLOOD
HCT: 33.4 % — ABNORMAL LOW (ref 36.0–46.0)
Hemoglobin: 11.3 g/dL — ABNORMAL LOW (ref 12.0–15.0)

## 2018-02-10 MED ORDER — LIDOCAINE HCL 2 % IJ SOLN
0.1000 mL | INTRAMUSCULAR | Status: DC
  Filled 2018-02-10: qty 10

## 2018-02-10 NOTE — Progress Notes (Signed)
Difficult stick for labs and phlebotomy today, phlebotomy took over 1 hour. Overall, Pt tolerated well.  taken, BP stable upon completion.

## 2018-02-14 ENCOUNTER — Telehealth: Payer: Self-pay | Admitting: *Deleted

## 2018-02-14 NOTE — Telephone Encounter (Signed)
Pt called / informed "ferritin down to 181! " per Dr Cyndie ChimeGranfortuna. She wants to know if she needs to continue monthly phlebotomies? Thanks

## 2018-02-14 NOTE — Telephone Encounter (Signed)
Yes - until she is less than 150

## 2018-02-14 NOTE — Telephone Encounter (Signed)
-----   Message from Levert FeinsteinJames M Granfortuna, MD sent at 02/14/2018  8:58 AM EDT ----- Call pt: ferritin down to 181!

## 2018-02-15 NOTE — Telephone Encounter (Signed)
Called pt - unavailable; talked to her husband, she needs to continue monthly phlebotomies "until she is less than 150" per Dr Cyndie ChimeGranfortuna. Stated great, this will set a goal for her and he will tell pt.

## 2018-03-10 ENCOUNTER — Encounter (HOSPITAL_COMMUNITY): Payer: Self-pay

## 2018-03-10 ENCOUNTER — Encounter (HOSPITAL_COMMUNITY)
Admission: RE | Admit: 2018-03-10 | Discharge: 2018-03-10 | Disposition: A | Discharge status: Home / Self Care | Payer: Medicare Other | Source: Ambulatory Visit | Attending: Oncology | Admitting: Oncology

## 2018-03-10 LAB — HEMOGLOBIN AND HEMATOCRIT, BLOOD
HEMATOCRIT: 37.4 % (ref 36.0–46.0)
Hemoglobin: 12.4 g/dL (ref 12.0–15.0)

## 2018-03-10 LAB — FERRITIN: FERRITIN: 135 ng/mL (ref 11–307)

## 2018-03-10 MED ORDER — LIDOCAINE HCL 2 % IJ SOLN
0.1000 mL | INTRAMUSCULAR | Status: DC
  Filled 2018-03-10: qty 10

## 2018-03-10 NOTE — Progress Notes (Signed)
Catherine KailSusan Harrington presents today for phlebotomy per MD orders. HGB 12.4. Ferritin 135. (ferritin sample had to be redrawn, per lab "hemolyzed") Phlebotomy procedure started at 1150 and ended at 1235.  300cc removed. Patient tolerated procedure well.

## 2018-03-13 ENCOUNTER — Telehealth: Payer: Self-pay | Admitting: *Deleted

## 2018-03-13 NOTE — Telephone Encounter (Signed)
Called pt - informed "ferritin down to 135. We are getting close. When under 100 will decrease phlebotomy to every other month " per Dr Cyndie ChimeGranfortuna. Stated if it gets that low,"I won't be able to walk" (laughing).

## 2018-03-13 NOTE — Telephone Encounter (Signed)
-----   Message from Levert FeinsteinJames M Granfortuna, MD sent at 03/10/2018  3:16 PM EDT ----- Call pt: ferritin down to 135. We are getting close. When under 100 will decrease phlebotomy to every other month

## 2018-04-14 ENCOUNTER — Encounter (HOSPITAL_COMMUNITY)
Admission: RE | Admit: 2018-04-14 | Discharge: 2018-04-14 | Disposition: A | Discharge status: Home / Self Care | Payer: Medicare Other | Source: Ambulatory Visit | Attending: Oncology | Admitting: Oncology

## 2018-04-14 ENCOUNTER — Encounter (HOSPITAL_COMMUNITY): Payer: Self-pay

## 2018-04-14 LAB — HEMOGLOBIN AND HEMATOCRIT, BLOOD
HEMATOCRIT: 35.5 % — AB (ref 36.0–46.0)
Hemoglobin: 12.2 g/dL (ref 12.0–15.0)

## 2018-04-14 LAB — FERRITIN: FERRITIN: 128 ng/mL (ref 11–307)

## 2018-04-14 MED ORDER — LIDOCAINE HCL 2 % IJ SOLN
0.1000 mL | INTRAMUSCULAR | Status: DC
  Filled 2018-04-14: qty 10

## 2018-04-14 NOTE — Discharge Instructions (Signed)
911 for emergencies   Call Md for any problems or questions.     Therapeutic Phlebotomy Therapeutic phlebotomy is the controlled removal of blood from a person's body for the purpose of treating a medical condition. The procedure is similar to donating blood. Usually, about a pint (470 mL, or 0.47L) of blood is removed. The average adult has 9-12 pints (4.3-5.7 L) of blood. Therapeutic phlebotomy may be used to treat the following medical conditions:  Hemochromatosis. This is a condition in which the blood contains too much iron.  Polycythemia vera. This is a condition in which the blood contains too many red blood cells.  Porphyria cutanea tarda. This is a disease in which an important part of hemoglobin is not made properly. It results in the buildup of abnormal amounts of porphyrins in the body.  Sickle cell disease. This is a condition in which the red blood cells form an abnormal crescent shape rather than a round shape.  Tell a health care provider about:  Any allergies you have.  All medicines you are taking, including vitamins, herbs, eye drops, creams, and over-the-counter medicines.  Any problems you or family members have had with anesthetic medicines.  Any blood disorders you have.  Any surgeries you have had.  Any medical conditions you have. What are the risks? Generally, this is a safe procedure. However, problems may occur, including:  Nausea or light-headedness.  Low blood pressure.  Soreness, bleeding, swelling, or bruising at the needle insertion site.  Infection.  What happens before the procedure?  Follow instructions from your health care provider about eating or drinking restrictions.  Ask your health care provider about changing or stopping your regular medicines. This is especially important if you are taking diabetes medicines or blood thinners.  Wear clothing with sleeves that can be raised above the elbow.  Plan to have someone take you  home after the procedure.  You may have a blood sample taken. What happens during the procedure?  A needle will be inserted into one of your veins.  Tubing and a collection bag will be attached to that needle.  Blood will flow through the needle and tubing into the collection bag.  You may be asked to open and close your hand slowly and continually during the entire collection.  After the specified amount of blood has been removed from your body, the collection bag and tubing will be clamped.  The needle will be removed from your vein.  Pressure will be held on the site of the needle insertion to stop the bleeding.  A bandage (dressing) will be placed over the needle insertion site. The procedure may vary among health care providers and hospitals. What happens after the procedure?  Your recovery will be assessed and monitored.  You can return to your normal activities as directed by your health care provider. This information is not intended to replace advice given to you by your health care provider. Make sure you discuss any questions you have with your health care provider. Document Released: 04/19/2011 Document Revised: 07/17/2016 Document Reviewed: 11/11/2014 Elsevier Interactive Patient Education  [employer].

## 2018-04-14 NOTE — Progress Notes (Signed)
Leanor Kail presents today for phlebotomy per MD orders. HGB/HCT 12.2/ 35.5 Phlebotomy procedure started at 1045 and ended at 1105 300 cc removed. Patient tolerated procedure well. IV needle removed intact. VSS.

## 2018-04-17 ENCOUNTER — Telehealth (INDEPENDENT_AMBULATORY_CARE_PROVIDER_SITE_OTHER): Payer: Self-pay | Admitting: Physical Medicine and Rehabilitation

## 2018-04-17 ENCOUNTER — Other Ambulatory Visit (INDEPENDENT_AMBULATORY_CARE_PROVIDER_SITE_OTHER): Payer: Self-pay | Admitting: Physical Medicine and Rehabilitation

## 2018-04-17 ENCOUNTER — Telehealth: Payer: Self-pay | Admitting: *Deleted

## 2018-04-17 MED ORDER — GABAPENTIN 300 MG PO CAPS: ORAL_CAPSULE | ORAL | 4 refills | Status: DC

## 2018-04-17 NOTE — Telephone Encounter (Signed)
Done with refills added

## 2018-04-17 NOTE — Telephone Encounter (Signed)
-----   Message from Levert Feinstein, MD sent at 04/14/2018  4:53 PM EDT ----- Call pt: ferritin about the same as last time - a little lower at 128. I am OK to go to every 2 months for phlebotomies. P.S. She needs an appt: has not seen me in over 1 year. DrG

## 2018-04-17 NOTE — Telephone Encounter (Signed)
Pt called / informed "ferritin about the same as last time - a little lower at 128. I am OK to go to every 2 months for phlebotomies." per Dr Cyndie Chime. Appt scheduled for tomorrow to see Dr Reece Agar.

## 2018-04-17 NOTE — Telephone Encounter (Signed)
Patient notified

## 2018-04-17 NOTE — Telephone Encounter (Signed)
Talked to [place], [facility] - stated Dr Cyndie Chime needs to put in new order for every 2 months phlebotomies.

## 2018-04-18 ENCOUNTER — Other Ambulatory Visit: Payer: Self-pay

## 2018-04-18 ENCOUNTER — Ambulatory Visit (INDEPENDENT_AMBULATORY_CARE_PROVIDER_SITE_OTHER): Payer: Medicare Other | Admitting: Oncology

## 2018-04-18 ENCOUNTER — Encounter: Payer: Self-pay | Admitting: Oncology

## 2018-04-18 DIAGNOSIS — L930 Discoid lupus erythematosus: Secondary | ICD-10-CM

## 2018-04-18 DIAGNOSIS — Z882 Allergy status to sulfonamides status: Secondary | ICD-10-CM

## 2018-04-18 DIAGNOSIS — F17210 Nicotine dependence, cigarettes, uncomplicated: Secondary | ICD-10-CM

## 2018-04-18 DIAGNOSIS — H409 Unspecified glaucoma: Secondary | ICD-10-CM | POA: Diagnosis not present

## 2018-04-18 DIAGNOSIS — Z888 Allergy status to other drugs, medicaments and biological substances status: Secondary | ICD-10-CM | POA: Diagnosis not present

## 2018-04-18 DIAGNOSIS — N183 Chronic kidney disease, stage 3 unspecified: Secondary | ICD-10-CM

## 2018-04-18 DIAGNOSIS — Z886 Allergy status to analgesic agent status: Secondary | ICD-10-CM

## 2018-04-18 DIAGNOSIS — D539 Nutritional anemia, unspecified: Secondary | ICD-10-CM | POA: Diagnosis not present

## 2018-04-18 DIAGNOSIS — R740 Nonspecific elevation of levels of transaminase and lactic acid dehydrogenase [LDH]: Secondary | ICD-10-CM

## 2018-04-18 DIAGNOSIS — K7400 Hepatic fibrosis, unspecified: Secondary | ICD-10-CM | POA: Insufficient documentation

## 2018-04-18 DIAGNOSIS — M479 Spondylosis, unspecified: Secondary | ICD-10-CM

## 2018-04-18 HISTORY — DX: Hepatic fibrosis, unspecified: K74.00

## 2018-04-18 NOTE — Progress Notes (Signed)
Hematology and Oncology Follow Up Visit  Catherine Harrington [id number] 24-Aug-1946 72 y.o. 04/18/2018 1:43 PM   Principle Diagnosis: Encounter Diagnoses  Name Primary?  . Discoid lupus erythematosus Yes  . Chronic renal impairment, stage 3 (moderate) (HCC)   . Hereditary hemochromatosis ([place])   . Macrocytic anemia   Clinical summary: 72 year old woman referred for further evaluation of chronic transaminase elevations with  findings of increased iron, iron saturation, and ferritin. 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 Interpretation of the ferritin is confounded by an underlying collagen vascular disorder which might lead to chronic inflammation and potentially autoimmune hepatitis, and possible coexisting liver disease from long-standing alcohol use. 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 I repeated a ferritin which confirmed that she is in a range that is associated with organ deposition/damage at 1308. Serum iron 188. Percent saturation 82%. Ultrasound of the liver with additional fibro-elastography showed a homogeneous pattern to the liver parenchyma with no focal lesions. No splenomegaly. (No signs of portal hypertension). But there were areas of 3 and 4+ fibrosis. We discussed options to decrease her iron burden. I recommended a trial of phlebotomy but we also discussed potential medical therapy with an iron chelating agent. Attempt at phlebotomy done on 07/06/2016. There was technical difficulty getting a large vein and she experienced significant pain. The procedure was aborted.  I initiated the groundwork necessary to start somebody on oral iron chelation with desferasirox, brand name Jadenu.  She had formal ophthalmology exam with slit lamp exam which was fortuitous since she was diagnosed with early glaucoma and  started on treatment.  She had a hearing test due to the potential of the drug to affect hearing as well as vision.  Urine screen to establish that she had no proteinuria at baseline.  At the end of the day, the biggest obstacle was financial.  Medicare would only cover a fraction of the cost. At time of most recent visit with me on January 22nd a "root cause analysis" of the problems she had with the first phlebotomy was done, and she, together with her husband, decided she would try phlebotomy again at a different location with more experienced personnel, premedication with lorazepam, and local lidocaine anesthesia.  In view of pre-existing anemia I limited the phlebotomy to 300 mL's.  She had the first procedure on January 24 and everything went well.  She continued on a modified monthly phlebotomy program with a slow but steady decline in her ferritin.  Interim History:  Overall doing well.  Tolerating the phlebotomy program.  Slow and steady decline in her ferritin.  Level down from peak value of 1308 at time of initiation of phlebotomy to most recent value of 128 on Apr 14, 2018.  Improvement but not complete resolution of elevated transaminase levels. No interim medical problems.  No cardiorespiratory complaints.  No GI complaints except for chronic atypical abdominal pain occurring in certain positions.  Medications: reviewed  Allergies:  Allergies  Allergen Reactions  . Prednisone Anaphylaxis  . Sulfonamide Derivatives     REACTION: throat swells  . Epinephrine Palpitations    In lidocaine gave her "flutters" per dentist  . Midol [Ibuprofen]     Review of Systems: See interim history Remaining ROS negative:   Physical Exam: There were no vitals taken for this visit.  Wt Readings from Last 3 Encounters:  02/10/18 104 lb (47.2 kg)  01/17/18 106 lb (48.1 kg)  12/13/17 104 lb 6.4 oz (47.4 kg)     General appearance: Frail Caucasian woman HENNT: Pharynx no erythema, exudate,  mass, or ulcer. No thyromegaly or thyroid nodules Lymph nodes: No cervical, supraclavicular, or axillary lymphadenopathy Breasts:  Lungs: Clear to auscultation, resonant to percussion throughout Heart: Regular rhythm, no murmur, no gallop, no rub, no click, no edema Abdomen: Soft, nontender at 45 degrees elevation but she experiences pain on attempt to lie supine., normal bowel sounds, no mass, no gross organomegaly Extremities: No edema, no calf tenderness Musculoskeletal: no joint deformities GU:  Vascular: Carotid pulses 2+, no bruits,  Neurologic: Alert, oriented, PERRLA,   cranial nerves grossly normal, motor strength 5 over 5, reflexes 1+ symmetric, upper body coordination normal, gait normal, Skin: No rash or ecchymosis  Lab Results: CBC W/Diff    Component Value Date/Time   WBC 3.3 (L) 09/27/2017 1145   RBC 2.98 (L) 09/27/2017 1145   HGB 12.2 04/14/2018 1014   HGB 12.2 12/20/2016 1127   HCT 35.5 (L) 04/14/2018 1014   HCT 37.2 12/20/2016 1127   PLT 156 09/27/2017 1145   PLT 160 12/20/2016 1127   MCV 106.4 (H) 09/27/2017 1145   MCV 108 (H) 12/20/2016 1127   MCH 36.6 (H) 09/27/2017 1145   MCHC 34.4 09/27/2017 1145   RDW 13.6 09/27/2017 1145   RDW 13.8 12/20/2016 1127   LYMPHSABS 1.2 09/27/2017 1145   LYMPHSABS 1.4 12/20/2016 1127   MONOABS 0.4 09/27/2017 1145   EOSABS 0.0 09/27/2017 1145   EOSABS 0.0 12/20/2016 1127   BASOSABS 0.0 09/27/2017 1145   BASOSABS 0.0 12/20/2016 1127     Chemistry      Component Value Date/Time   NA 135 09/27/2017 1145   NA 142 12/20/2016 1127   K 4.4 09/27/2017 1145   CL 102 09/27/2017 1145   CO2 23 09/27/2017 1145   BUN 13 09/27/2017 1145   BUN 13 12/20/2016 1127   CREATININE 1.05 (H) 09/27/2017 1145      Component Value Date/Time   CALCIUM 9.1 09/27/2017 1145   ALKPHOS 79 09/27/2017 1145   AST 101 (H) 09/27/2017 1145   ALT 58 (H) 09/27/2017 1145   BILITOT 0.9 09/27/2017 1145   BILITOT 0.6 12/20/2016 1127        Radiological Studies: No results found.  Impression:  1.  Multifactorial iron overload Heterozygote for the C282Y hemochromatosis gene.  Suspected contribution from previous chronic alcohol use and potential contribution from chronic inflammation associated with discoid lupus.  Early fibrotic changes on ultrasound elastography done July 2017.  I will get a follow-up study at this time. Hemoglobin has drifted down to 10.5 g on the phlebotomy program.  I am going to change to every other month phlebotomies.  I am comfortable keeping her ferritin at 150 or less.  I have reevaluated my initial target range of less than or equal to 100. New orders placed.  2.  Macrocytic anemia with normal T55 and folic acid levels. Likely nonspecific changes in red cell membrane cholesterol related to prior alcohol use.  3.  Chronic renal insufficiency stage III. Creatinine has stabilized over time.  Most recent value 1.1 on Apr 18, 2018.  4.  Discoid lupus  6.  Degenerative arthritis of the spine  7.  Glaucoma    CC: Patient Care Team: Levin Erp, MD as PCP - General   Murriel Hopper,  MD, FACP  Hematology-Oncology/Internal Medicine     5/21/20191:43 PM

## 2018-04-18 NOTE — Patient Instructions (Addendum)
To lab today Decrease phlebotomy to every 2 months: next on 7/19 Follow up ultrasound of liver at [facility]: someone will call you Lab & MD visit in 6 months

## 2018-04-19 ENCOUNTER — Telehealth: Payer: Self-pay | Admitting: *Deleted

## 2018-04-19 ENCOUNTER — Other Ambulatory Visit: Payer: Self-pay | Admitting: Oncology

## 2018-04-19 ENCOUNTER — Encounter: Payer: Self-pay | Admitting: Oncology

## 2018-04-19 DIAGNOSIS — K7400 Hepatic fibrosis, unspecified: Secondary | ICD-10-CM

## 2018-04-19 LAB — CBC WITH DIFFERENTIAL/PLATELET
BASOS ABS: 0 10*3/uL (ref 0.0–0.2)
Basos: 0 %
EOS (ABSOLUTE): 0 10*3/uL (ref 0.0–0.4)
Eos: 0 %
Hematocrit: 31.6 % — ABNORMAL LOW (ref 34.0–46.6)
Hemoglobin: 10.5 g/dL — ABNORMAL LOW (ref 11.1–15.9)
IMMATURE GRANS (ABS): 0 10*3/uL (ref 0.0–0.1)
IMMATURE GRANULOCYTES: 0 %
Lymphocytes Absolute: 1.5 10*3/uL (ref 0.7–3.1)
Lymphs: 34 %
MCH: 35.6 pg — ABNORMAL HIGH (ref 26.6–33.0)
MCHC: 33.2 g/dL (ref 31.5–35.7)
MCV: 107 fL — ABNORMAL HIGH (ref 79–97)
MONOCYTES: 8 %
MONOS ABS: 0.4 10*3/uL (ref 0.1–0.9)
NEUTROS ABS: 2.6 10*3/uL (ref 1.4–7.0)
Neutrophils: 58 %
PLATELETS: 176 10*3/uL (ref 150–450)
RBC: 2.95 x10E6/uL — ABNORMAL LOW (ref 3.77–5.28)
RDW: 13.1 % (ref 12.3–15.4)
WBC: 4.5 10*3/uL (ref 3.4–10.8)

## 2018-04-19 LAB — COMPREHENSIVE METABOLIC PANEL
A/G RATIO: 1.8 (ref 1.2–2.2)
ALBUMIN: 4.6 g/dL (ref 3.5–4.8)
ALT: 47 IU/L — ABNORMAL HIGH (ref 0–32)
AST: 70 IU/L — ABNORMAL HIGH (ref 0–40)
Alkaline Phosphatase: 78 IU/L (ref 39–117)
BILIRUBIN TOTAL: 0.3 mg/dL (ref 0.0–1.2)
BUN / CREAT RATIO: 14 (ref 12–28)
BUN: 15 mg/dL (ref 8–27)
CHLORIDE: 105 mmol/L (ref 96–106)
CO2: 18 mmol/L — ABNORMAL LOW (ref 20–29)
Calcium: 9.5 mg/dL (ref 8.7–10.3)
Creatinine, Ser: 1.11 mg/dL — ABNORMAL HIGH (ref 0.57–1.00)
GFR calc non Af Amer: 50 mL/min/{1.73_m2} — ABNORMAL LOW (ref >59)
GFR, EST AFRICAN AMERICAN: 57 mL/min/{1.73_m2} — AB (ref >59)
GLOBULIN, TOTAL: 2.5 g/dL (ref 1.5–4.5)
GLUCOSE: 98 mg/dL (ref 65–99)
Potassium: 4.8 mmol/L (ref 3.5–5.2)
SODIUM: 141 mmol/L (ref 134–144)
TOTAL PROTEIN: 7.1 g/dL (ref 6.0–8.5)

## 2018-04-19 NOTE — Telephone Encounter (Signed)
Pt called / informed "liver functions slowly improving; kidney function stable; hemoglobin down to 10.5 so we will decrease phlebotomy to every other month as we discussed" per Dr Cyndie Chime. Aware next appt is July 12.

## 2018-04-19 NOTE — Telephone Encounter (Signed)
-----   Message from Levert Feinstein, MD sent at 04/19/2018  9:15 AM EDT ----- Call pt: liver functions slowly improving; kidney function stable; hemoglobin down to 10.5 so we will decrease phlebotomy to every other month as we discussed.

## 2018-04-19 NOTE — Telephone Encounter (Signed)
done

## 2018-05-01 ENCOUNTER — Other Ambulatory Visit (HOSPITAL_COMMUNITY): Payer: Self-pay | Admitting: General Practice

## 2018-05-03 ENCOUNTER — Ambulatory Visit (HOSPITAL_COMMUNITY)
Admission: RE | Admit: 2018-05-03 | Discharge: 2018-05-03 | Disposition: A | Discharge status: Home / Self Care | Payer: Medicare Other | Source: Ambulatory Visit | Attending: Oncology | Admitting: Oncology

## 2018-05-03 DIAGNOSIS — K7400 Hepatic fibrosis, unspecified: Secondary | ICD-10-CM

## 2018-05-04 ENCOUNTER — Telehealth: Payer: Self-pay | Admitting: *Deleted

## 2018-05-04 NOTE — Telephone Encounter (Signed)
-----   Message from Levert FeinsteinJames M Granfortuna, MD sent at 05/03/2018  1:53 PM EDT ----- Call pt: ultrasound liver stable compared with previous

## 2018-05-04 NOTE — Telephone Encounter (Signed)
Pt called / informed "ultrasound liver stable compared with previous " per Dr Cyndie ChimeGranfortuna. Stated thanks for calling.

## 2018-05-12 ENCOUNTER — Encounter (HOSPITAL_COMMUNITY): Payer: Medicare Other

## 2018-05-18 ENCOUNTER — Telehealth (INDEPENDENT_AMBULATORY_CARE_PROVIDER_SITE_OTHER): Payer: Self-pay | Admitting: *Deleted

## 2018-05-19 NOTE — Telephone Encounter (Signed)
Pt is scheduled for 06/15/18 with Driver.

## 2018-05-19 NOTE — Telephone Encounter (Signed)
Two-level trans esi unless she feels like similar to last time we saw in December was a trigger point injection. If she is unsure just do as trans esi and we can change if need to on her appointment day.

## 2018-05-24 ENCOUNTER — Other Ambulatory Visit (HOSPITAL_COMMUNITY): Payer: Self-pay | Admitting: *Deleted

## 2018-05-25 ENCOUNTER — Other Ambulatory Visit (HOSPITAL_COMMUNITY): Payer: Self-pay | Admitting: General Practice

## 2018-05-26 ENCOUNTER — Telehealth: Payer: Self-pay | Admitting: *Deleted

## 2018-05-26 NOTE — Telephone Encounter (Signed)
According to Epic, pt's appt is scheduled for 7/12. [facility] informed Dr Cyndie ChimeGranfortuna is out of town, will be back on Monday; he can placed orders when he gets back.

## 2018-05-26 NOTE — Telephone Encounter (Signed)
-----   Message from Burns SpainElizabeth A Butcher, MD sent at 05/26/2018  9:03 AM EDT ----- Regarding: RE: Request for New Therapeutic Phlebotomy Orders and Lidocaine 2% @ site for IV Start Contact: [phone] Keenen Roessner I am happy to order but you always seem to have a handle on Dr Timoteo ExposeG's pts. Should I go ahead and order? ----- Message ----- From: Inez CatalinaMullen, Emily B, MD Sent: 05/25/2018   9:44 AM To: Burns SpainElizabeth A Butcher, MD Subject: FW: Request for New Therapeutic Phlebotomy O#  Aram BeechamHey Liz - -  Can you look at this?  I see that you have ordered it before, and I am not sure how to order?    Thanks!  EBM ----- Message ----- From: Katherene PontoWilliams, Cathy D, NT Sent: 05/25/2018   9:05 AM To: Doneen PoissonLawrence Klima, MD, Inez CatalinaEmily B Mullen, MD, # Subject: Request for New Therapeutic Phlebotomy Order#  Good morning Dr. Reece AgarG and all other Partners. I am sending this message to request New therapeutic Phlebotomy orders and Lidocaine 2% for her site at IV start for her next visit, Please and thank you in advance as always.   [employer]

## 2018-05-26 NOTE — Telephone Encounter (Signed)
Thank you :)

## 2018-05-29 ENCOUNTER — Other Ambulatory Visit: Payer: Self-pay | Admitting: Oncology

## 2018-05-29 NOTE — Telephone Encounter (Signed)
Orders already in but short stay can not see them so I had to put them in again.

## 2018-06-05 ENCOUNTER — Encounter: Payer: Medicare Other | Admitting: Oncology

## 2018-06-06 ENCOUNTER — Other Ambulatory Visit: Payer: Self-pay | Admitting: Oncology

## 2018-06-06 ENCOUNTER — Telehealth: Payer: Self-pay | Admitting: *Deleted

## 2018-06-06 ENCOUNTER — Other Ambulatory Visit (HOSPITAL_COMMUNITY): Payer: Self-pay | Admitting: General Practice

## 2018-06-06 NOTE — Telephone Encounter (Signed)
[facility] [place] - orders for pt's next phlebotomy is entered incorrectly; they can see the order but unable to obtain - needs to be  "sign and held".  Dr Cyndie ChimeGranfortuna called/ informed.

## 2018-06-07 NOTE — Telephone Encounter (Signed)
Dr Reece AgarG aware.

## 2018-06-09 ENCOUNTER — Telehealth: Payer: Self-pay | Admitting: *Deleted

## 2018-06-09 ENCOUNTER — Encounter (HOSPITAL_COMMUNITY)
Admission: RE | Admit: 2018-06-09 | Discharge: 2018-06-09 | Disposition: A | Discharge status: Home / Self Care | Payer: Medicare Other | Source: Ambulatory Visit | Attending: Oncology | Admitting: Oncology

## 2018-06-09 ENCOUNTER — Encounter (HOSPITAL_COMMUNITY): Payer: Self-pay

## 2018-06-09 LAB — HEMATOCRIT: HCT: 34.1 % — ABNORMAL LOW (ref 36.0–46.0)

## 2018-06-09 LAB — HEMOGLOBIN: HEMOGLOBIN: 11.8 g/dL — AB (ref 12.0–15.0)

## 2018-06-09 LAB — FERRITIN: Ferritin: 118 ng/mL (ref 11–307)

## 2018-06-09 NOTE — Progress Notes (Signed)
Catherine KailSusan Lux presents today for phlebotomy per MD orders. Phlebotomy procedure started at 1043 and ended at 1100. 300 cc removed.  Patient tolerated procedure well. And stayed 30 minutes post procedure, no problems.

## 2018-06-09 NOTE — Telephone Encounter (Signed)
-----   Message from Levert FeinsteinJames M Granfortuna, MD sent at 06/09/2018 12:47 PM EDT ----- Call pt: ferritin good @ 118

## 2018-06-09 NOTE — Telephone Encounter (Signed)
Pt called / informed "ferritin good @ 118" per Dr Cyndie ChimeGranfortuna. Stated she saw results and had phlebotomy this morning. Asking about appt to see Dr Reece AgarG - told her I will seen message to [place] [facility]oris S.

## 2018-06-15 ENCOUNTER — Other Ambulatory Visit: Payer: Self-pay | Admitting: Oncology

## 2018-06-15 ENCOUNTER — Encounter

## 2018-06-15 ENCOUNTER — Ambulatory Visit (INDEPENDENT_AMBULATORY_CARE_PROVIDER_SITE_OTHER): Payer: Self-pay

## 2018-06-15 ENCOUNTER — Encounter (INDEPENDENT_AMBULATORY_CARE_PROVIDER_SITE_OTHER): Payer: Self-pay | Admitting: Physical Medicine and Rehabilitation

## 2018-06-15 ENCOUNTER — Ambulatory Visit (INDEPENDENT_AMBULATORY_CARE_PROVIDER_SITE_OTHER): Payer: Medicare Other | Admitting: Physical Medicine and Rehabilitation

## 2018-06-15 VITALS — BP 125/65 | HR 85

## 2018-06-15 DIAGNOSIS — M545 Low back pain: Secondary | ICD-10-CM | POA: Diagnosis not present

## 2018-06-15 DIAGNOSIS — M609 Myositis, unspecified: Secondary | ICD-10-CM

## 2018-06-15 DIAGNOSIS — G8929 Other chronic pain: Secondary | ICD-10-CM

## 2018-06-15 DIAGNOSIS — M4155 Other secondary scoliosis, thoracolumbar region: Secondary | ICD-10-CM

## 2018-06-15 DIAGNOSIS — M48061 Spinal stenosis, lumbar region without neurogenic claudication: Secondary | ICD-10-CM

## 2018-06-15 NOTE — Progress Notes (Signed)
Catherine Harrington - 72 y.o. female MRN [id number]  Date of birth: 11-09-1946  Office Visit Note: Visit Date: 06/15/2018 PCP: Nila Nephew, MD Referred by: Nila Nephew, MD  Subjective: Chief Complaint  Patient presents with  . Lower Back - Pain  . Right Leg - Pain   HPI: Catherine Harrington is a very pleasant 72 year old female very well-known to me with a history of lumbar scoliosis and right foraminal lateral recess stenosis about the L3 and L4 region.  She is done well with prior epidural injections from a transforaminal approach as well as a trigger point injection.  Trigger point injection at least on one occasion gave her really a flareup of her symptoms and she is done well at different times with [facility] physical therapy performing dry needling and manual release.  She comes in today with worsening lower back pain which is mainly centered at the right lower back in the quadratus lumborum area.  She has pain with standing and any activity and twisting.  She reports relief only with rest and different positions and medication.  She has had no radicular complaints although she gets some radiating symptoms at times to the right thigh.  Prior quadratus lumborum injection back before a trip in the early part of the year worked really well for her.  She is requesting this today.  She is had no new symptoms otherwise.  Her history is problematic because she does have history of discoid lupus erythematosus as well as hemochromatosis and macrocytic anemia.  She does continue to smoke.  She has had no focal weakness or other red flag complaints.   Review of Systems  Constitutional: Positive for malaise/fatigue and weight loss. Negative for chills and fever.  HENT: Negative for hearing loss and sinus pain.   Eyes: Negative for blurred vision, double vision and photophobia.  Respiratory: Negative for cough and shortness of breath.   Cardiovascular: Negative for chest pain, palpitations and leg swelling.    Gastrointestinal: Negative for abdominal pain, nausea and vomiting.  Genitourinary: Negative for flank pain.  Musculoskeletal: Positive for back pain and joint pain. Negative for myalgias.  Skin: Negative for itching and rash.  Neurological: Negative for tremors, focal weakness and weakness.  Endo/Heme/Allergies: Negative.   Psychiatric/Behavioral: Negative for depression.  All other systems reviewed and are negative.  Otherwise per HPI.  Assessment & Plan: Visit Diagnoses:  1. Myofascitis   2. Chronic right-sided low back pain without sciatica   3. Other secondary scoliosis, thoracolumbar region   4. Spinal stenosis of lumbar region without neurogenic claudication     Plan: Findings:  Chronic mostly right-sided low back pain and hip pain which is multifactorial given the patient's significant scoliosis and lateral recess and foraminal stenosis at L3 and L4 on the right.  She has had multiple bouts of physical therapy and medication management and she still tries to stay active.  She is actually moving better than when I saw her back in January and she did well with prior intramuscular muscle sheath injection around the quadratus lumborum on the right.  As for her pain mainly as today as well.  We did reinject this today and that is reviewed below.  Depending on how she does I would look at transforaminal injection at L4 which is helped in the past.  I did not really see any need and re-doing any imaging today but that the consideration depending on her outcome.  She is doing much better from a hemochromatosis standpoint and  they are trying to basically get her down to where she is not having too many therapeutic phlebotomies.  She continues to smoke unfortunately.  48-pack-year history of tobacco use.  She has not had a history of compression fracture but she is high risk for osteoporosis and compression fracture.  No change in current medication.    Meds & Orders: No orders of the defined  types were placed in this encounter.  No orders of the defined types were placed in this encounter.   Follow-up: Return if symptoms worsen or fail to improve.   Procedures: No procedures performed  Quadratus lumborum Injection  Patient: Catherine Harrington      Date of Birth: 01/26/1946 MRN: [id number] PCP: Nila Nephew, MD      Visit Date: 06/15/2018   Universal Protocol:    Date/Time: 07/31/196:57 AM  Consent Given By: the patient  Position: prone  Additional Comments: Vital signs were monitored before and after the procedure. Patient was prepped and draped in the usual sterile fashion. The correct patient, procedure, and site was verified.   Injection Procedure Details:  Procedure Site One Meds Administered: No orders of the defined types were placed in this encounter.    Laterality: Right  Location/Site:  L5-S1  Needle size: 25 G   Needle Placement: intramuscular and along tendon sheath, quadratus lumborum    Procedure Details: A 25 gauge needle was introduced into the region described above with an appropriate approach.  After confirmation of negative blood and air aspiration, the above solution was injected freely without resistance.   Additional Comments:  The patient tolerated the procedure well Dressing: Band-Aid    Post-procedure details: Patient was observed during the procedure. Post-procedure instructions were reviewed.  Patient left the clinic in stable condition.   No notes on file   Clinical History: MRI LUMBAR SPINE WITHOUT CONTRAST 05/09/2017  TECHNIQUE: Multiplanar, multisequence MR imaging of the lumbar spine was performed. No intravenous contrast was administered.  COMPARISON:  Lumbar spine MRI 06/11/2014  FINDINGS: Segmentation:  Normal  Alignment:  There is right convex lumbar scoliosis with apex at L3.  Vertebrae: Bone marrow signal is heterogeneous, unchanged. No evidence of discitis osteomyelitis. No acute  compression fracture.  Conus medullaris: Extends to the L1 level and appears normal.  Paraspinal and other soft tissues: There is fatty atrophy of the posterior paraspinous muscles, slightly worse on the left.  Disc levels:  T9-T12: These levels are assessed on sagittal sequences only. No disc herniation, spinal canal stenosis or neural foraminal narrowing.  T12-L1: Normal disc space and facets. No spinal canal or neuroforaminal stenosis.  L1-L2: Severe disc space narrowing with minimal bulge. Moderate right and mild left neural foraminal narrowing, primarily caused by scoliotic curvature.  L2-L3: Severe disc space loss with endplate osteophytosis. No spinal canal stenosis. Moderate right and severe left neural foraminal stenosis, unchanged. Left-greater-than-right facet arthrosis.  L3-L4: Severe disc space narrowing. No spinal canal stenosis. There is unchanged severe left foraminal stenosis. Right neural foramen is widely patent.  L4-L5: Disc space narrowing with central disc protrusion, unchanged. Severe bilateral facet hypertrophy, also unchanged. No central spinal canal stenosis. Severe right and moderate left neural foraminal stenosis, unchanged.  L5-S1: Moderate bilateral facet hypertrophy but no stenosis.  Visualized sacrum: Normal.  IMPRESSION: 1. Unchanged appearance of marked lumbar dextroscoliosis, which, in combination degenerative disc disease and facet arthrosis, that leads to severe neural foraminal narrowing at multiple levels, including L2-3, L3-4 and L4-5. These findings are unchanged, however, compared to the  MRI of 06/11/2014. 2. No central spinal canal stenosis of the lumbar spine. 3. Multilevel moderate to severe facet arthrosis, which may serve as a source of localized back pain.  Lspine MRI 06/11/14 There is moderate lumbar dextroscoliosis with apex at L3. There is left lateral listhesis of L2 on L3 and right lateral listhesis of  L3 on L4 and L4 on L5. Bone marrow signal is somewhat heterogeneous diffusely and overall decreased in signal in the lower thoracic spine (possibly reflecting patient's history of anemia) with prominent fatty degenerative marrow changes in the mid and lower lumbar spine. Modic type 1 degenerative marrow changes are also noted in this region. There is lateral recess narrowing at L2-3 on the right.   She reports that she has been smoking cigarettes.  She has a 48.00 pack-year smoking history. She has never used smokeless tobacco. No results for input(s): HGBA1C, LABURIC in the last 8760 hours.  Objective:  VS:  HT:    WT:   BMI:     BP:125/65  HR:85bpm  TEMP: ( )  RESP:  Physical Exam  Constitutional: She is oriented to person, place, and time. She appears well-developed. No distress.  Thin appearing  HENT:  Head: Normocephalic and atraumatic.  Nose: Nose normal.  Mouth/Throat: Oropharynx is clear and moist.  Eyes: Pupils are equal, round, and reactive to light. Conjunctivae are normal.  Neck: Neck supple. No JVD present. No tracheal deviation present.  Cardiovascular: Regular rhythm and intact distal pulses.  Pulmonary/Chest: Effort normal. No respiratory distress.  Abdominal: She exhibits no distension. There is no guarding.  Musculoskeletal:  Patient ambulates without aid today but has difficulty arising from a seated position and standing and extension.  She does have pain with facet joint loading with extension rotation.  She has obvious scoliotic deformity to the right.  She has fairly equal hip heights.  She has no pain over the greater trochanters.  She has exquisite pain to palpation along the paraspinal quadratus lumborum with likely focal trigger point or tight spasm.  She has good distal strength without clonus.  Neurological: She is alert and oriented to person, place, and time. She exhibits normal muscle tone. Coordination normal.  Skin: Skin is warm. No rash noted. No  erythema.  Psychiatric: She has a normal mood and affect. Her behavior is normal.  Nursing note and vitals reviewed.   Ortho Exam Imaging: No results found.  Past Medical/Family/Surgical/Social History: Medications & Allergies reviewed per EMR, new medications updated. Patient Active Problem List   Diagnosis Date Noted  . Fibrosis of liver 04/18/2018  . Macrocytic anemia 06/29/2016  . Chronic renal insufficiency, stage III (moderate) (HCC) 06/29/2016  . Acute right flank pain 06/29/2016  . Chronic renal insufficiency 06/29/2016  . Hemochromatosis 05/18/2016  . 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 Macrocytosis 08/16/2013  . Syncope 08/16/2013  . DIVERTICULOSIS, COLON 11/04/2010   Past Medical History:  Diagnosis Date  . Acute right flank pain 06/29/2016  . Arthritis    "related to the lupus; joints" (08/16/2013)  . Chronic renal insufficiency, stage III (moderate) (HCC) 06/29/2016  . 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 Family history of anesthesia complication    "daughter doesn't wake up easy from it" (08/16/2013)  . Fibrosis of liver 04/18/2018  . 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 Hypertension   . Iron deficiency anemia   . Lumbar disc disease   . Lupus (HCC)    "just  the rash and arthritis" (08/16/2013)  . Macrocytic anemia 06/29/2016  . 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 Scoliosis   . Syncope and collapse    "lost consciousness ~ 4-5 seconds; didn't hurt myself" (08/16/2013)   Family History  Problem Relation Age of Onset  . Hypertension Mother   . Hypertension Father    Past Surgical History:  Procedure Laterality Date  . CESAREAN SECTION  1967; 1971  . MOUTH SURGERY  2013   " infection under bridge cleaned out, etc" (08/16/2013)  . TUBAL LIGATION     Social History   Occupational History  . Occupation: retired  Tobacco Use  . Smoking status: Current Every Day  Smoker    Packs/day: 1.00    Years: 48.00    Pack years: 48.00    Types: Cigarettes  . Smokeless tobacco: Never Used  Substance and Sexual Activity  . Alcohol use: Yes    Alcohol/week: 12.6 oz    Types: 21 Cans of beer per week    Comment:  3-4 drinks daily sometimes.  . Drug use: No  . Sexual activity: Not Currently    Partners: Male    Birth control/protection: Post-menopausal

## 2018-06-15 NOTE — Progress Notes (Signed)
 .  Numeric Pain Rating Scale and Functional Assessment Average Pain 5   In the last MONTH (on 0-10 scale) has pain interfered with the following?  1. General activity like being  able to carry out your everyday physical activities such as walking, climbing stairs, carrying groceries, or moving a chair?  Rating(4)   +Driver, -BT, -Dye Allergies.  

## 2018-06-19 ENCOUNTER — Other Ambulatory Visit: Payer: Self-pay

## 2018-06-19 ENCOUNTER — Ambulatory Visit (INDEPENDENT_AMBULATORY_CARE_PROVIDER_SITE_OTHER): Payer: Medicare Other | Admitting: Oncology

## 2018-06-19 ENCOUNTER — Encounter: Payer: Self-pay | Admitting: Oncology

## 2018-06-19 DIAGNOSIS — Z888 Allergy status to other drugs, medicaments and biological substances status: Secondary | ICD-10-CM

## 2018-06-19 DIAGNOSIS — Z7289 Other problems related to lifestyle: Secondary | ICD-10-CM | POA: Diagnosis not present

## 2018-06-19 DIAGNOSIS — Z886 Allergy status to analgesic agent status: Secondary | ICD-10-CM | POA: Diagnosis not present

## 2018-06-19 DIAGNOSIS — L930 Discoid lupus erythematosus: Secondary | ICD-10-CM | POA: Diagnosis not present

## 2018-06-19 DIAGNOSIS — N183 Chronic kidney disease, stage 3 unspecified: Secondary | ICD-10-CM

## 2018-06-19 DIAGNOSIS — H409 Unspecified glaucoma: Secondary | ICD-10-CM

## 2018-06-19 DIAGNOSIS — F17210 Nicotine dependence, cigarettes, uncomplicated: Secondary | ICD-10-CM | POA: Diagnosis not present

## 2018-06-19 DIAGNOSIS — M479 Spondylosis, unspecified: Secondary | ICD-10-CM | POA: Diagnosis not present

## 2018-06-19 DIAGNOSIS — Z882 Allergy status to sulfonamides status: Secondary | ICD-10-CM | POA: Diagnosis not present

## 2018-06-19 DIAGNOSIS — G8929 Other chronic pain: Secondary | ICD-10-CM

## 2018-06-19 DIAGNOSIS — N182 Chronic kidney disease, stage 2 (mild): Secondary | ICD-10-CM

## 2018-06-19 DIAGNOSIS — D539 Nutritional anemia, unspecified: Secondary | ICD-10-CM

## 2018-06-19 NOTE — Progress Notes (Signed)
Hematology and Oncology Follow Up Visit  Jonel Sick [id number] 10-27-1946 72 y.o. 06/19/2018 11:34 AM   Principle Diagnosis: Encounter Diagnoses  Name Primary?  . Discoid lupus erythematosus   . Chronic renal insufficiency, stage III (moderate) (HCC)   . Macrocytic anemia   . Hereditary hemochromatosis ([place]) Yes  Clinical summary: 72 year old woman referred for further evaluation of chronic transaminase elevations with  findings of increased iron, iron saturation, and ferritin. 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 Interpretation of the ferritin is confounded by an underlying collagen vascular disorder which might lead to chronic inflammation and potentially autoimmune hepatitis, and possible coexisting liver disease from long-standing alcohol use. 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 I repeated a ferritin which confirmed that she is in a range that is associated with organ deposition/damage at 1308. Serum iron 188. Percent saturation 82%. Ultrasound of the liver with additional fibro-elastography showed a homogeneous pattern to the liver parenchyma with no focal lesions. No splenomegaly. (No signs of portal hypertension). But there were areas of 3 and 4+ fibrosis. We discussed options to decrease her iron burden. I recommended a trial of phlebotomy but we also discussed potential medical therapy with an iron chelating agent. Attempt at phlebotomy done on 07/06/2016. There was technical difficulty getting a large vein and she experienced significant pain. The procedure was aborted.I initiated the groundwork necessary to start somebody on oral iron chelation withdesferasirox, brand name Jadenu.She had formal ophthalmology exam with slit lamp exam which was fortuitous since she was diagnosed with early  glaucoma and started on treatment. She had a hearing test due to the potential of the drug to affect hearing as well as vision. Urine screen to establish that she had no proteinuria at baseline. At the end of the day, the biggest obstacle was financial. Medicare would only cover a fraction of the cost. At time of most recent visit with me on January 22nd a "root cause analysis" of the problemsshe had with the first phlebotomy was done, and she, together with her husband,decided she would try phlebotomy again at a different location with more experienced personnel, premedication with lorazepam, and local lidocaine anesthesia. In view of pre-existing anemia I limited the phlebotomy to 300 mL's. She had the first procedure on January 24 and everything went well. She continues on a modified monthly phlebotomy program with a slow but steady decline in her ferritin.   Interim History: Short interval follow-up visit. She is making steady progress on her phlebotomy program.  Most recent ferritin 118 on July 12.  I decreased her phlebotomy to every 2 months.  Still removing just 300 mL's. No other interim medical problems.  She is seeing an orthopedic surgeon for her chronic back pain.  She got a cortisone shot about 1 week ago.  Not much relief so far.  She is considering using oral CBD oil.  I told her I did not know much about it but I would tend not to use a product that is not FDA approved. She is going to have cataract surgery next month. We spent some time going over her medication list.  Most of her medicines were not current and I deleted them from her list.   Medications: reviewed  Allergies:  Allergies  Allergen Reactions  . Prednisone Anaphylaxis  . Sulfonamide Derivatives  REACTION: throat swells  . Epinephrine Palpitations    In lidocaine gave her "flutters" per dentist  . Midol [Ibuprofen]     Review of Systems: See interim history Remaining ROS negative:   Physical  Exam: Blood pressure 137/60, pulse 82, temperature 98.4 F (36.9 C), temperature source Oral, height 5' 2"  (1.575 m), weight 103 lb 12.8 oz (47.1 kg), SpO2 99 %. Wt Readings from Last 3 Encounters:  06/19/18 103 lb 12.8 oz (47.1 kg)  06/09/18 103 lb (46.7 kg)  04/18/18 104 lb 1.6 oz (47.2 kg)     General appearance: Thin, frail, Caucasian woman HENNT: Pharynx no erythema, exudate, mass, or ulcer. No thyromegaly or thyroid nodules Lymph nodes: No cervical, supraclavicular, or axillary lymphadenopathy Breasts: Lungs: Clear to auscultation, resonant to percussion throughout Heart: Regular rhythm, no murmur, no gallop, no rub, no click, no edema Abdomen: Soft, nontender, normal bowel sounds, no mass, no organomegaly Extremities: No edema, no calf tenderness Musculoskeletal: no joint deformities GU:  Vascular: Carotid pulses 2+, no bruits, Neurologic: Alert, oriented, PERRLA,, cranial nerves grossly normal, motor strength 5 over 5, reflexes 1+ symmetric, upper body coordination normal, gait normal, Skin: No rash or ecchymosis  Lab Results: CBC W/Diff    Component Value Date/Time   WBC 4.5 04/18/2018 1424   WBC 3.3 (L) 09/27/2017 1145   RBC 2.95 (L) 04/18/2018 1424   RBC 2.98 (L) 09/27/2017 1145   HGB 11.8 (L) 06/09/2018 1005   HGB 10.5 (L) 04/18/2018 1424   HCT 34.1 (L) 06/09/2018 1005   HCT 31.6 (L) 04/18/2018 1424   PLT 176 04/18/2018 1424   MCV 107 (H) 04/18/2018 1424   MCH 35.6 (H) 04/18/2018 1424   MCH 36.6 (H) 09/27/2017 1145   MCHC 33.2 04/18/2018 1424   MCHC 34.4 09/27/2017 1145   RDW 13.1 04/18/2018 1424   LYMPHSABS 1.5 04/18/2018 1424   MONOABS 0.4 09/27/2017 1145   EOSABS 0.0 04/18/2018 1424   BASOSABS 0.0 04/18/2018 1424     Chemistry      Component Value Date/Time   NA 141 04/18/2018 1424   K 4.8 04/18/2018 1424   CL 105 04/18/2018 1424   CO2 18 (L) 04/18/2018 1424   BUN 15 04/18/2018 1424   CREATININE 1.11 (H) 04/18/2018 1424      Component Value  Date/Time   CALCIUM 9.5 04/18/2018 1424   ALKPHOS 78 04/18/2018 1424   AST 70 (H) 04/18/2018 1424   ALT 47 (H) 04/18/2018 1424   BILITOT 0.3 04/18/2018 1424       Radiological Studies: No results found.  Impression:  1.  Hemochromatosis Components of hereditary hemochromatosis with heterozygote status for the Z660Y gene complicated by prior history of chronic alcohol use causing iron trapping in the liver.  Steady progress on current phlebotomy program now every 2 months.  Anticipate I will be able to decrease frequency of phlebotomy over time to every 3-4 months.  She is encouraged to stay on the program.  I will transition her hematology care to [facility] after the new year.  2.  Chronic transaminase elevations secondary to #1. Values static over time but no trend for deterioration.  Most recent AST 70, ALT 47, bilirubin 0.3 on Apr 18, 2018.  3.  Stage 2-3 chronic renal insufficiency also stable over time after a transient deterioration back in June 2017.  4.  Discoid lupus not currently active  5.  Glaucoma  6.  Chronic macrocytic anemia  7.  Degenerative arthritis of the spine  CC: Patient Care Team: Levin Erp, MD as PCP - General   Murriel Hopper, MD, [place]  Hematology-Oncology/Internal Medicine     7/22/201911:34 AM

## 2018-06-19 NOTE — Patient Instructions (Addendum)
[facility]  Months  Please schedule new patient visit with Dr Malachy MoodYan Feng after 11/29/18: hemochromatosis on chronic phlebotomy program; discoid lupus - not active

## 2018-06-26 ENCOUNTER — Telehealth (INDEPENDENT_AMBULATORY_CARE_PROVIDER_SITE_OTHER): Payer: Self-pay | Admitting: Physical Medicine and Rehabilitation

## 2018-06-26 NOTE — Telephone Encounter (Signed)
Right L4 transforaminal versus and/or L4-5 facet block.

## 2018-06-26 NOTE — Telephone Encounter (Signed)
Patient is requesting valium prior. [facility] [place].

## 2018-06-27 ENCOUNTER — Other Ambulatory Visit (INDEPENDENT_AMBULATORY_CARE_PROVIDER_SITE_OTHER): Payer: Self-pay | Admitting: Physical Medicine and Rehabilitation

## 2018-06-27 MED ORDER — DIAZEPAM 5 MG PO TABS: ORAL_TABLET | ORAL | 0 refills | Status: DC

## 2018-06-28 ENCOUNTER — Encounter (INDEPENDENT_AMBULATORY_CARE_PROVIDER_SITE_OTHER): Payer: Self-pay | Admitting: Physical Medicine and Rehabilitation

## 2018-07-19 ENCOUNTER — Ambulatory Visit (INDEPENDENT_AMBULATORY_CARE_PROVIDER_SITE_OTHER): Payer: Self-pay

## 2018-07-19 ENCOUNTER — Ambulatory Visit (INDEPENDENT_AMBULATORY_CARE_PROVIDER_SITE_OTHER): Payer: Medicare Other | Admitting: Physical Medicine and Rehabilitation

## 2018-07-19 ENCOUNTER — Encounter (INDEPENDENT_AMBULATORY_CARE_PROVIDER_SITE_OTHER): Payer: Self-pay | Admitting: Physical Medicine and Rehabilitation

## 2018-07-19 VITALS — BP 130/72 | HR 72

## 2018-07-19 DIAGNOSIS — M4125 Other idiopathic scoliosis, thoracolumbar region: Secondary | ICD-10-CM

## 2018-07-19 DIAGNOSIS — M5416 Radiculopathy, lumbar region: Secondary | ICD-10-CM

## 2018-07-19 DIAGNOSIS — M48061 Spinal stenosis, lumbar region without neurogenic claudication: Secondary | ICD-10-CM | POA: Diagnosis not present

## 2018-07-19 MED ORDER — BETAMETHASONE SOD PHOS & ACET 6 (3-3) MG/ML IJ SUSP
12.0000 mg | Freq: Once | INTRAMUSCULAR | Status: AC
  Administered 2018-07-19: 12 mg

## 2018-07-19 NOTE — Patient Instructions (Signed)

## 2018-07-19 NOTE — Progress Notes (Signed)
Pt states pain in lower back. Pt states any activity   .Numeric Pain Rating Scale and Functional Assessment Average Pain 10   In the last MONTH (on 0-10 scale) has pain interfered with the following?  1. General activity like being  able to carry out your everyday physical activities such as walking, climbing stairs, carrying groceries, or moving a chair?  Rating(8)   +Driver, -BT, -Dye Allergies.

## 2018-08-03 NOTE — Procedures (Signed)
Lumbosacral Transforaminal Epidural Steroid Injection - Sub-Pedicular Approach with Fluoroscopic Guidance  Patient: Catherine Harrington      Date of Birth: Apr 21, 1946 MRN: [id number] PCP: Nila Nephew, MD      Visit Date: 07/19/2018   Universal Protocol:    Date/Time: 07/19/2018  Consent Given By: the patient  Position: PRONE  Additional Comments: Vital signs were monitored before and after the procedure. Patient was prepped and draped in the usual sterile fashion. The correct patient, procedure, and site was verified.   Injection Procedure Details:  Procedure Site One Meds Administered:  Meds ordered this encounter  Medications  . betamethasone acetate-betamethasone sodium phosphate (CELESTONE) injection 12 mg    Laterality: Right  Location/Site:  L4-L5  Needle size: 22 G  Needle type: Spinal  Needle Placement: Transforaminal  Findings:    -Comments: Excellent flow of contrast along the nerve and into the epidural space.  Procedure Details: After squaring off the end-plates to get a true AP view, the C-arm was positioned so that an oblique view of the foramen as noted above was visualized. The target area is just inferior to the "nose of the scotty dog" or sub pedicular. The soft tissues overlying this structure were infiltrated with 2-3 ml. of 1% Lidocaine without Epinephrine.  The spinal needle was inserted toward the target using a "trajectory" view along the fluoroscope beam.  Under AP and lateral visualization, the needle was advanced so it did not puncture dura and was located close the 6 O'Clock position of the pedical in AP tracterory. Biplanar projections were used to confirm position. Aspiration was confirmed to be negative for CSF and/or blood. A 1-2 ml. volume of Isovue-250 was injected and flow of contrast was noted at each level. Radiographs were obtained for documentation purposes.   After attaining the desired flow of contrast documented above, a 0.5 to 1.0  ml test dose of 0.25% Marcaine was injected into each respective transforaminal space.  The patient was observed for 90 seconds post injection.  After no sensory deficits were reported, and normal lower extremity motor function was noted,   the above injectate was administered so that equal amounts of the injectate were placed at each foramen (level) into the transforaminal epidural space.   Additional Comments:  The patient tolerated the procedure well Dressing: Band-Aid    Post-procedure details: Patient was observed during the procedure. Post-procedure instructions were reviewed.  Patient left the clinic in stable condition.

## 2018-08-03 NOTE — Progress Notes (Signed)
Catherine Harrington - 72 y.o. female MRN [id number]  Date of birth: 06-02-1946  Office Visit Note: Visit Date: 07/19/2018 PCP: Catherine Nephew, MD Referred by: Catherine Nephew, MD  Subjective: Chief Complaint  Patient presents with  . Lower Back - Pain   HPI: Catherine Harrington is a 72 year old female, very pleasant lady that we have gotten to know quite well over several years.  She has severe degenerative scoliosis particularly at the L3-4 level with right convex scoliosis and lateral recess narrowing both left and right.  We completed trigger point injection short while ago without much relief.  She intermittently gets really good relief with trigger point injections and dry needling of her myofascial pain on the right.  She comes in today with severe right low back pain.  No radiating symptoms down the legs.  Prior epidural injection which was done last year at the L5 level did help her leg pain.  In the past the back pain is been more amenable to L4 injection where there is lateral recess narrowing.  She has been using lidocaine patches as well as CBD oil without relief.  We have suggested trying topical CBD oil as an alternative.  We are going to complete a diagnostic and hopefully therapeutic right L4 transforaminal injection.  Patient's case is complicated by long-term tobacco use as well as more recent hemochromatosis.   ROS Otherwise per HPI.  Assessment & Plan: Visit Diagnoses:  1. Lumbar radiculopathy   2. Spinal stenosis of lumbar region without neurogenic claudication   3. Other idiopathic scoliosis, thoracolumbar region     Plan: No additional findings.   Meds & Orders:  Meds ordered this encounter  Medications  . betamethasone acetate-betamethasone sodium phosphate (CELESTONE) injection 12 mg    Orders Placed This Encounter  Procedures  . XR C-ARM NO REPORT  . Epidural Steroid injection    Follow-up: Return if symptoms worsen or fail to improve.   Procedures: No procedures  performed  Lumbosacral Transforaminal Epidural Steroid Injection - Sub-Pedicular Approach with Fluoroscopic Guidance  Patient: Catherine Harrington      Date of Birth: [birth year] MRN: [id number] PCP: Catherine Nephew, MD      Visit Date: 07/19/2018   Universal Protocol:    Date/Time: 07/19/2018  Consent Given By: the patient  Position: PRONE  Additional Comments: Vital signs were monitored before and after the procedure. Patient was prepped and draped in the usual sterile fashion. The correct patient, procedure, and site was verified.   Injection Procedure Details:  Procedure Site One Meds Administered:  Meds ordered this encounter  Medications  . betamethasone acetate-betamethasone sodium phosphate (CELESTONE) injection 12 mg    Laterality: Right  Location/Site:  L4-L5  Needle size: 22 G  Needle type: Spinal  Needle Placement: Transforaminal  Findings:    -Comments: Excellent flow of contrast along the nerve and into the epidural space.  Procedure Details: After squaring off the end-plates to get a true AP view, the C-arm was positioned so that an oblique view of the foramen as noted above was visualized. The target area is just inferior to the "nose of the scotty dog" or sub pedicular. The soft tissues overlying this structure were infiltrated with 2-3 ml. of 1% Lidocaine without Epinephrine.  The spinal needle was inserted toward the target using a "trajectory" view along the fluoroscope beam.  Under AP and lateral visualization, the needle was advanced so it did not puncture dura and was located close the 6 O'Clock position of the pedical  in AP tracterory. Biplanar projections were used to confirm position. Aspiration was confirmed to be negative for CSF and/or blood. A 1-2 ml. volume of Isovue-250 was injected and flow of contrast was noted at each level. Radiographs were obtained for documentation purposes.   After attaining the desired flow of contrast documented above, a  0.5 to 1.0 ml test dose of 0.25% Marcaine was injected into each respective transforaminal space.  The patient was observed for 90 seconds post injection.  After no sensory deficits were reported, and normal lower extremity motor function was noted,   the above injectate was administered so that equal amounts of the injectate were placed at each foramen (level) into the transforaminal epidural space.   Additional Comments:  The patient tolerated the procedure well Dressing: Band-Aid    Post-procedure details: Patient was observed during the procedure. Post-procedure instructions were reviewed.  Patient left the clinic in stable condition.    Clinical History: MRI LUMBAR SPINE WITHOUT CONTRAST 05/09/2017  TECHNIQUE: Multiplanar, multisequence MR imaging of the lumbar spine was performed. No intravenous contrast was administered.  COMPARISON:  Lumbar spine MRI 06/11/2014  FINDINGS: Segmentation:  Normal  Alignment:  There is right convex lumbar scoliosis with apex at L3.  Vertebrae: Bone marrow signal is heterogeneous, unchanged. No evidence of discitis osteomyelitis. No acute compression fracture.  Conus medullaris: Extends to the L1 level and appears normal.  Paraspinal and other soft tissues: There is fatty atrophy of the posterior paraspinous muscles, slightly worse on the left.  Disc levels:  T9-T12: These levels are assessed on sagittal sequences only. No disc herniation, spinal canal stenosis or neural foraminal narrowing.  T12-L1: Normal disc space and facets. No spinal canal or neuroforaminal stenosis.  L1-L2: Severe disc space narrowing with minimal bulge. Moderate right and mild left neural foraminal narrowing, primarily caused by scoliotic curvature.  L2-L3: Severe disc space loss with endplate osteophytosis. No spinal canal stenosis. Moderate right and severe left neural foraminal stenosis, unchanged. Left-greater-than-right facet  arthrosis.  L3-L4: Severe disc space narrowing. No spinal canal stenosis. There is unchanged severe left foraminal stenosis. Right neural foramen is widely patent.  L4-L5: Disc space narrowing with central disc protrusion, unchanged. Severe bilateral facet hypertrophy, also unchanged. No central spinal canal stenosis. Severe right and moderate left neural foraminal stenosis, unchanged.  L5-S1: Moderate bilateral facet hypertrophy but no stenosis.  Visualized sacrum: Normal.  IMPRESSION: 1. Unchanged appearance of marked lumbar dextroscoliosis, which, in combination degenerative disc disease and facet arthrosis, that leads to severe neural foraminal narrowing at multiple levels, including L2-3, L3-4 and L4-5. These findings are unchanged, however, compared to the MRI of 06/11/2014. 2. No central spinal canal stenosis of the lumbar spine. 3. Multilevel moderate to severe facet arthrosis, which may serve as a source of localized back pain.  Lspine MRI 06/11/14 There is moderate lumbar dextroscoliosis with apex at L3. There is left lateral listhesis of L2 on L3 and right lateral listhesis of L3 on L4 and L4 on L5. Bone marrow signal is somewhat heterogeneous diffusely and overall decreased in signal in the lower thoracic spine (possibly reflecting patient's history of anemia) with prominent fatty degenerative marrow changes in the mid and lower lumbar spine. Modic type 1 degenerative marrow changes are also noted in this region. There is lateral recess narrowing at L2-3 on the right.   She reports that she has been smoking cigarettes. She has a 48.00 pack-year smoking history. She has never used smokeless tobacco. No results for input(s): HGBA1C,  LABURIC in the last 8760 hours.  Objective:  VS:  HT:    WT:   BMI:     BP:130/72  HR:72bpm  TEMP: ( )  RESP:  Physical Exam  Ortho Exam Imaging: No results found.  Past Medical/Family/Surgical/Social History: Medications &  Allergies reviewed per EMR, new medications updated. Patient Active Problem List   Diagnosis Date Noted  . Fibrosis of liver 04/18/2018  . Macrocytic anemia 06/29/2016  . Chronic renal insufficiency, stage III (moderate) (HCC) 06/29/2016  . Acute right flank pain 06/29/2016  . Chronic renal insufficiency 06/29/2016  . Hemochromatosis 05/18/2016  . 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 Macrocytosis 08/16/2013  . Syncope 08/16/2013  . DIVERTICULOSIS, COLON 11/04/2010   Past Medical History:  Diagnosis Date  . Acute right flank pain 06/29/2016  . Arthritis    "related to the lupus; joints" (08/16/2013)  . Chronic renal insufficiency, stage III (moderate) (HCC) 06/29/2016  . 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 Family history of anesthesia complication    "daughter doesn't wake up easy from it" (08/16/2013)  . Fibrosis of liver 04/18/2018  . 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 Hypertension   . Iron deficiency anemia   . Lumbar disc disease   . Lupus (HCC)    "just the rash and arthritis" (08/16/2013)  . Macrocytic anemia 06/29/2016  . 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 Scoliosis   . Syncope and collapse    "lost consciousness ~ 4-5 seconds; didn't hurt myself" (08/16/2013)   Family History  Problem Relation Age of Onset  . Hypertension Mother   . Hypertension Father    Past Surgical History:  Procedure Laterality Date  . CESAREAN SECTION  1967; 1971  . MOUTH SURGERY  2013   " infection under bridge cleaned out, etc" (08/16/2013)  . TUBAL LIGATION     Social History   Occupational History  . Occupation: retired  Tobacco Use  . Smoking status: Current Every Day Smoker    Packs/day: 1.00    Years: 48.00    Pack years: 48.00    Types: Cigarettes  . Smokeless tobacco: Never Used  Substance and Sexual Activity  . Alcohol use: Yes    Alcohol/week: 21.0 standard drinks    Types: 21  Cans of beer per week    Comment:  3-4 drinks daily sometimes.  . Drug use: No  . Sexual activity: Not Currently    Partners: Male    Birth control/protection: Post-menopausal

## 2018-08-15 ENCOUNTER — Encounter (HOSPITAL_COMMUNITY)
Admission: RE | Admit: 2018-08-15 | Discharge: 2018-08-15 | Disposition: A | Discharge status: Home / Self Care | Payer: Medicare Other | Source: Ambulatory Visit | Attending: Oncology | Admitting: Oncology

## 2018-08-15 ENCOUNTER — Other Ambulatory Visit: Payer: Self-pay | Admitting: Oncology

## 2018-08-15 ENCOUNTER — Encounter (HOSPITAL_COMMUNITY): Payer: Self-pay

## 2018-08-15 LAB — COMPREHENSIVE METABOLIC PANEL
ALT: 32 U/L (ref 0–44)
AST: 43 U/L — ABNORMAL HIGH (ref 15–41)
Albumin: 3.9 g/dL (ref 3.5–5.0)
Alkaline Phosphatase: 70 U/L (ref 38–126)
Anion gap: 8 (ref 5–15)
BILIRUBIN TOTAL: 0.6 mg/dL (ref 0.3–1.2)
BUN: 17 mg/dL (ref 8–23)
CHLORIDE: 103 mmol/L (ref 98–111)
CO2: 26 mmol/L (ref 22–32)
CREATININE: 0.94 mg/dL (ref 0.44–1.00)
Calcium: 9.2 mg/dL (ref 8.9–10.3)
GFR, EST NON AFRICAN AMERICAN: 59 mL/min — AB (ref >60)
Glucose, Bld: 95 mg/dL (ref 70–99)
POTASSIUM: 4.5 mmol/L (ref 3.5–5.1)
Sodium: 137 mmol/L (ref 135–145)
TOTAL PROTEIN: 7.2 g/dL (ref 6.5–8.1)

## 2018-08-15 LAB — FERRITIN: FERRITIN: 90 ng/mL (ref 11–307)

## 2018-08-15 LAB — HEMOGLOBIN: HEMOGLOBIN: 11.1 g/dL — AB (ref 12.0–15.0)

## 2018-08-15 NOTE — Progress Notes (Signed)
Catherine KailSusan Harrington presents today for phlebotomy per MD orders. HGG:11.1 Phlebotomy procedure started at 10:50 and ended at 1100. 300 cc removed. Patient tolerated procedure well. IV needle removed intact.

## 2018-08-15 NOTE — Discharge Instructions (Signed)

## 2018-08-16 ENCOUNTER — Other Ambulatory Visit: Payer: Self-pay | Admitting: Oncology

## 2018-08-16 ENCOUNTER — Telehealth: Payer: Self-pay | Admitting: *Deleted

## 2018-08-16 NOTE — Telephone Encounter (Signed)
Pt called / informed "ferritin down to 90" per Dr Cyndie ChimeGranfortuna. Stated she saw result on My Chart; she stated he(Dr G) wanted me below 100; she has a phlebotomy scheduled in Nov then  Jan 2020 She wants to know "what to do", cancel or keep the appt in Nov?

## 2018-08-16 NOTE — Telephone Encounter (Signed)
Pt informed to keep phlebotomy appt in Nov then every 3 months (possible).  Stated she needs to scheduled an appt  (4-5 months from last visit) - message sent to Lenn[employer] CorporationDoris S.

## 2018-08-16 NOTE — Telephone Encounter (Signed)
-----   Message from Levert FeinsteinJames M Granfortuna, MD sent at 08/15/2018  1:16 PM EDT ----- Call pt: ferritin down to 90

## 2018-08-16 NOTE — Telephone Encounter (Signed)
Advise her to keep the phlebotomy appointment in November. After that, I can stretch phlebotomy to every 3 months.

## 2018-08-28 ENCOUNTER — Other Ambulatory Visit: Payer: Self-pay | Admitting: Oncology

## 2018-08-28 DIAGNOSIS — L930 Discoid lupus erythematosus: Secondary | ICD-10-CM

## 2018-08-28 NOTE — Progress Notes (Signed)
ref

## 2018-08-29 ENCOUNTER — Other Ambulatory Visit: Payer: Self-pay | Admitting: Internal Medicine

## 2018-08-29 DIAGNOSIS — Z1231 Encounter for screening mammogram for malignant neoplasm of breast: Secondary | ICD-10-CM

## 2018-10-03 ENCOUNTER — Ambulatory Visit (INDEPENDENT_AMBULATORY_CARE_PROVIDER_SITE_OTHER): Payer: Medicare Other | Admitting: Oncology

## 2018-10-03 ENCOUNTER — Encounter: Payer: Self-pay | Admitting: Oncology

## 2018-10-03 ENCOUNTER — Other Ambulatory Visit: Payer: Self-pay

## 2018-10-03 DIAGNOSIS — Z9841 Cataract extraction status, right eye: Secondary | ICD-10-CM | POA: Diagnosis not present

## 2018-10-03 DIAGNOSIS — Z9181 History of falling: Secondary | ICD-10-CM | POA: Diagnosis not present

## 2018-10-03 DIAGNOSIS — F17210 Nicotine dependence, cigarettes, uncomplicated: Secondary | ICD-10-CM

## 2018-10-03 DIAGNOSIS — Z886 Allergy status to analgesic agent status: Secondary | ICD-10-CM

## 2018-10-03 DIAGNOSIS — H409 Unspecified glaucoma: Secondary | ICD-10-CM

## 2018-10-03 DIAGNOSIS — Z882 Allergy status to sulfonamides status: Secondary | ICD-10-CM

## 2018-10-03 DIAGNOSIS — G8929 Other chronic pain: Secondary | ICD-10-CM | POA: Diagnosis not present

## 2018-10-03 DIAGNOSIS — M549 Dorsalgia, unspecified: Secondary | ICD-10-CM | POA: Diagnosis not present

## 2018-10-03 DIAGNOSIS — N183 Chronic kidney disease, stage 3 (moderate): Secondary | ICD-10-CM | POA: Diagnosis not present

## 2018-10-03 DIAGNOSIS — E039 Hypothyroidism, unspecified: Secondary | ICD-10-CM

## 2018-10-03 DIAGNOSIS — D539 Nutritional anemia, unspecified: Secondary | ICD-10-CM | POA: Diagnosis not present

## 2018-10-03 DIAGNOSIS — M4690 Unspecified inflammatory spondylopathy, site unspecified: Secondary | ICD-10-CM | POA: Diagnosis not present

## 2018-10-03 DIAGNOSIS — Z79890 Hormone replacement therapy (postmenopausal): Secondary | ICD-10-CM | POA: Diagnosis not present

## 2018-10-03 DIAGNOSIS — L930 Discoid lupus erythematosus: Secondary | ICD-10-CM

## 2018-10-03 DIAGNOSIS — Z888 Allergy status to other drugs, medicaments and biological substances status: Secondary | ICD-10-CM

## 2018-10-03 DIAGNOSIS — N182 Chronic kidney disease, stage 2 (mild): Secondary | ICD-10-CM

## 2018-10-03 DIAGNOSIS — G8911 Acute pain due to trauma: Secondary | ICD-10-CM | POA: Diagnosis not present

## 2018-10-03 DIAGNOSIS — M25562 Pain in left knee: Secondary | ICD-10-CM | POA: Diagnosis not present

## 2018-10-03 DIAGNOSIS — Z9842 Cataract extraction status, left eye: Secondary | ICD-10-CM

## 2018-10-03 NOTE — Progress Notes (Signed)
Hematology and Oncology Follow Up Visit  Catherine Harrington [id number] 01/15/1946 72 y.o. 10/03/2018 10:01 AM   Principle Diagnosis: Encounter Diagnoses  Name Primary?  . 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 Macrocytic anemia   . Discoid lupus erythematosus   . Chronic renal impairment, stage 2 (mild)   Clinical Summary: 72 year old woman referred for further evaluation of chronic transaminase elevations with findings of increased iron, iron saturation, and ferritin. 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 Interpretation of the ferritin is confounded by an underlying collagen vascular disorder which might lead to chronic inflammation and potentially autoimmune hepatitis, and possible coexisting liver disease from long-standing alcohol use. 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 I repeated a ferritin which confirmedthat she is in a range that is associated with organ deposition/damage at 1308. Serum iron 188. Percent saturation 82%. Ultrasound of the liver with additional fibro-elastography showeda homogeneous pattern to the liver parenchyma with no focal lesions. No splenomegaly. (No signs of portal hypertension). But there were areas of 3 and 4+ fibrosis. We discussed options to decrease her iron burden. I recommended a trial of phlebotomy but we also discussed potential medical therapy with an iron chelating agent. Attempt at phlebotomy done on 07/06/2016. There was technical difficulty getting a large vein and she experienced significant pain. The procedure was aborted.I initiated the groundwork necessary to start somebody on oral iron chelation withdesferasirox, brand name Jadenu.She had formal ophthalmology exam with slit lamp exam which was fortuitous since she was diagnosed with early glaucoma and started  on treatment. She had a hearing test due to the potential of the drug to affect hearing as well as vision. Urine screen to establish that she had no proteinuria at baseline. At the end of the day, the biggest obstacle was financial. Medicare would only cover a fraction of the cost. At time of most recent visit with me on January 22nd a "root cause analysis" of the problemsshe had with the first phlebotomy was done, and she,together with her husband,decided she would try phlebotomy again at a different location with more experienced personnel, premedication with lorazepam, and local lidocaine anesthesia. In view of pre-existing anemia I limited the phlebotomy to 300 mL's. She had the first procedure on January 24 and everything went well. Shecontinues on a modified monthly phlebotomy program with a slow but steady decline in her ferritin.   Interim History: Overall doing well.  Most recent ferritin on August 15, 2018 now in optimal range at 90 compared with pre-phlebotomy value of 1300.  Progressive improvement in liver function AST down to 43 just 2 points over high normal and complete normalization of ALT at 32.  Bilirubin 0.6. Peak AST 149, ALT 78 recorded in May 2018. She is tolerating the modified 300 mL phlebotomies and I have been able to increase interval between procedures now down to every 3 months.  Next scheduled phlebotomy on November 19.  Follow-up ultrasound with elastography technique done on May 03, 2018 stable compared with previous with no progressive fibrosis.  She just had bilateral cataract surgery by Dr. Talbert Forest and this went smoothly.  She fell down at home this week and struck her left knee and is having considerable pain.  This in addition to her chronic back pain.  I could not get her on the exam bench today  due to pain.  Medications: reviewed  Allergies:  Allergies  Allergen Reactions  . Prednisone Anaphylaxis    Does ok with intra-muscular and epidural  steroid  . Sulfonamide Derivatives     REACTION: throat swells  . Epinephrine Palpitations    In lidocaine gave her "flutters" per dentist  . Midol [Ibuprofen]     Review of Systems: See interim history Remaining ROS negative:   Physical Exam: There were no vitals taken for this visit. Wt Readings from Last 3 Encounters:  06/19/18 103 lb 12.8 oz (47.1 kg)  06/09/18 103 lb (46.7 kg)  04/18/18 104 lb 1.6 oz (47.2 kg)     General appearance: thin Caucasian woman HENNT: Pharynx no erythema, exudate, mass, or ulcer. No thyromegaly or thyroid nodules Lymph nodes: No cervical, supraclavicular, or axillary lymphadenopathy Breasts:  Lungs: Clear to auscultation, resonant to percussion throughout Heart: Regular rhythm, no murmur, no gallop, no rub, no click, no edema Abdomen: Soft, nontender, normal bowel sounds, no mass, no organomegaly Extremities: No edema, no calf tenderness Musculoskeletal: no joint deformities.  Left knee with no gross signs of trauma.  No ecchymosis.  No effusion.  Exquisitely tender to touch. GU:  Vascular: Carotid pulses 2+, no bruits,  Neurologic: Alert, oriented, PERRLA, cranial nerves grossly normal, motor strength 5 over 5, reflexes 1+ symmetric biceps, absent symmetric at the knees, upper body coordination normal, gait not tested. Skin: No rash or ecchymosis  Lab Results: CBC W/Diff    Component Value Date/Time   WBC 4.5 04/18/2018 1424   WBC 3.3 (L) 09/27/2017 1145   RBC 2.95 (L) 04/18/2018 1424   RBC 2.98 (L) 09/27/2017 1145   HGB 11.1 (L) 08/15/2018 1010   HGB 10.5 (L) 04/18/2018 1424   HCT 34.1 (L) 06/09/2018 1005   HCT 31.6 (L) 04/18/2018 1424   PLT 176 04/18/2018 1424   MCV 107 (H) 04/18/2018 1424   MCH 35.6 (H) 04/18/2018 1424   MCH 36.6 (H) 09/27/2017 1145   MCHC 33.2 04/18/2018 1424   MCHC 34.4 09/27/2017 1145   RDW 13.1 04/18/2018 1424   LYMPHSABS 1.5 04/18/2018 1424   MONOABS 0.4 09/27/2017 1145   EOSABS 0.0 04/18/2018 1424    BASOSABS 0.0 04/18/2018 1424     Chemistry      Component Value Date/Time   NA 137 08/15/2018 1010   NA 141 04/18/2018 1424   K 4.5 08/15/2018 1010   CL 103 08/15/2018 1010   CO2 26 08/15/2018 1010   BUN 17 08/15/2018 1010   BUN 15 04/18/2018 1424   CREATININE 0.94 08/15/2018 1010      Component Value Date/Time   CALCIUM 9.2 08/15/2018 1010   ALKPHOS 70 08/15/2018 1010   AST 43 (H) 08/15/2018 1010   ALT 32 08/15/2018 1010   BILITOT 0.6 08/15/2018 1010   BILITOT 0.3 04/18/2018 1424       Radiological Studies: See discussion above  Impression:  1.  Multifactorial iron overload syndrome with components of hereditary hemochromatosis, heterozygote status for the C282Y hemochromatosis gene, possible contribution from underlying collagen vascular disorder (discoid lupus) but normal ESR.  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 Significant improvement in liver functions with normalization of ferritin secondary to ongoing phlebotomy program. Currently getting phlebotomy every 3 months.  Potential to decrease to every 4 months.  Next procedure October 17, 2018.  I will check liver functions again in January. I will transition her hematology care to 1 of the doctors  at the cancer center in March 2020.  2.  Chronic macrocytic anemia Macrocytosis may be secondary to previous chronic alcohol use and hypothyroidism.  Normal I14 and folic acid.  Myelodysplastic syndrome not excluded.  3.  Discoid lupus  4.  CKD 3 which has improved over time and now resolved  5.  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  Hypothyroid on replacement  CC: Patient Care Team: Levin Erp, MD as PCP - General   Murriel Hopper, MD, FACP  Hematology-Oncology/Internal Medicine     11/5/201910:01 AM

## 2018-10-03 NOTE — Patient Instructions (Addendum)
Phlebotomy at [facility] short stay on Tuesday, November 19  Return visit with Dr Reece Agar in 3 months on January 23, 2019;  lab day of visit

## 2018-10-13 ENCOUNTER — Ambulatory Visit
Admission: RE | Admit: 2018-10-13 | Discharge: 2018-10-13 | Disposition: A | Discharge status: Home / Self Care | Payer: Medicare Other | Source: Ambulatory Visit | Attending: Internal Medicine | Admitting: Internal Medicine

## 2018-10-13 DIAGNOSIS — Z1231 Encounter for screening mammogram for malignant neoplasm of breast: Secondary | ICD-10-CM

## 2018-10-17 ENCOUNTER — Encounter (HOSPITAL_COMMUNITY)
Admission: RE | Admit: 2018-10-17 | Discharge: 2018-10-17 | Disposition: A | Discharge status: Home / Self Care | Payer: Medicare Other | Source: Ambulatory Visit | Attending: Oncology | Admitting: Oncology

## 2018-10-17 ENCOUNTER — Encounter (HOSPITAL_COMMUNITY): Payer: Self-pay

## 2018-10-17 DIAGNOSIS — D539 Nutritional anemia, unspecified: Secondary | ICD-10-CM

## 2018-10-17 LAB — COMPREHENSIVE METABOLIC PANEL
ALBUMIN: 4.3 g/dL (ref 3.5–5.0)
ALK PHOS: 49 U/L (ref 38–126)
ALT: 32 U/L (ref 0–44)
AST: 56 U/L — AB (ref 15–41)
Anion gap: 9 (ref 5–15)
BILIRUBIN TOTAL: 1 mg/dL (ref 0.3–1.2)
BUN: 12 mg/dL (ref 8–23)
CALCIUM: 9.4 mg/dL (ref 8.9–10.3)
CO2: 24 mmol/L (ref 22–32)
Chloride: 104 mmol/L (ref 98–111)
Creatinine, Ser: 1.14 mg/dL — ABNORMAL HIGH (ref 0.44–1.00)
GFR calc Af Amer: 54 mL/min — ABNORMAL LOW (ref >60)
GFR calc non Af Amer: 47 mL/min — ABNORMAL LOW (ref >60)
GLUCOSE: 102 mg/dL — AB (ref 70–99)
Potassium: 4.3 mmol/L (ref 3.5–5.1)
Sodium: 137 mmol/L (ref 135–145)
TOTAL PROTEIN: 7.5 g/dL (ref 6.5–8.1)

## 2018-10-17 LAB — CBC WITH DIFFERENTIAL/PLATELET
Abs Immature Granulocytes: 0.01 10*3/uL (ref 0.00–0.07)
BASOS ABS: 0 10*3/uL (ref 0.0–0.1)
Basophils Relative: 0 %
EOS PCT: 1 %
Eosinophils Absolute: 0 10*3/uL (ref 0.0–0.5)
HEMATOCRIT: 33.8 % — AB (ref 36.0–46.0)
HEMOGLOBIN: 11.1 g/dL — AB (ref 12.0–15.0)
IMMATURE GRANULOCYTES: 0 %
LYMPHS ABS: 1.2 10*3/uL (ref 0.7–4.0)
Lymphocytes Relative: 35 %
MCH: 35.9 pg — ABNORMAL HIGH (ref 26.0–34.0)
MCHC: 32.8 g/dL (ref 30.0–36.0)
MCV: 109.4 fL — ABNORMAL HIGH (ref 80.0–100.0)
MONOS PCT: 9 %
Monocytes Absolute: 0.3 10*3/uL (ref 0.1–1.0)
NRBC: 0 % (ref 0.0–0.2)
Neutro Abs: 1.9 10*3/uL (ref 1.7–7.7)
Neutrophils Relative %: 55 %
Platelets: 160 10*3/uL (ref 150–400)
RBC: 3.09 MIL/uL — ABNORMAL LOW (ref 3.87–5.11)
RDW: 13.2 % (ref 11.5–15.5)
WBC: 3.5 10*3/uL — ABNORMAL LOW (ref 4.0–10.5)

## 2018-10-17 LAB — LIPID PANEL
Cholesterol: 208 mg/dL — ABNORMAL HIGH (ref 0–200)
HDL: 108 mg/dL (ref >40)
LDL CALC: 89 mg/dL (ref 0–99)
Total CHOL/HDL Ratio: 1.9 RATIO
Triglycerides: 54 mg/dL (ref <150)
VLDL: 11 mg/dL (ref 0–40)

## 2018-10-17 LAB — TSH: TSH: 3.039 u[IU]/mL (ref 0.350–4.500)

## 2018-10-17 LAB — FERRITIN: Ferritin: 71 ng/mL (ref 11–307)

## 2018-10-17 NOTE — Progress Notes (Signed)
Patient arrives with order from Dr Nila NephewEdwin Green to call his office to obtain lab work. Orders received for CBC with diff, CMET, TSH, lipid panel, and T4. Faxed order received to do these labs.

## 2018-10-17 NOTE — Discharge Instructions (Signed)

## 2018-10-17 NOTE — Progress Notes (Signed)
Catherine KailSusan Harrington presents today for phlebotomy per MD orders. HGB 11.1 Phlebotomy procedure started at 11:15 AM and ended at 11:30AM. 300 cc removed. Patient tolerated procedure well. IV needle removed intact.

## 2018-10-18 ENCOUNTER — Telehealth: Payer: Self-pay | Admitting: *Deleted

## 2018-10-18 LAB — T4: T4, Total: 9 ug/dL (ref 4.5–12.0)

## 2018-10-18 NOTE — Telephone Encounter (Signed)
Called pt - husband stated she was not home and she had already looked at her results. Informed "ferritin good at 71 " per Dr Cyndie ChimeGranfortuna; he will let her know I had called.

## 2018-10-18 NOTE — Telephone Encounter (Signed)
-----   Message from Levert FeinsteinJames M Granfortuna, MD sent at 10/17/2018 11:45 AM EST ----- Call pt: ferritin good at 71

## 2018-12-08 ENCOUNTER — Telehealth: Payer: Self-pay | Admitting: *Deleted

## 2018-12-08 ENCOUNTER — Other Ambulatory Visit: Payer: Self-pay | Admitting: Oncology

## 2018-12-08 ENCOUNTER — Other Ambulatory Visit (HOSPITAL_COMMUNITY): Payer: Self-pay | Admitting: General Practice

## 2018-12-08 DIAGNOSIS — L930 Discoid lupus erythematosus: Secondary | ICD-10-CM

## 2018-12-08 NOTE — Telephone Encounter (Signed)
Pt informed ok to proceed w/phlebotomy per Dr Reece Agar.

## 2018-12-08 NOTE — Telephone Encounter (Signed)
Pt is scheduled for next phlebotomy on 1/14 Tuesday. Now, she's on a Z-pack for sinus infection (also has sorethroat and earache) which she will complete on Monday. But [facility] short stay wants to be sure it's ok to proceed with the phlebotomy ?

## 2018-12-08 NOTE — Telephone Encounter (Signed)
Answer is yes 

## 2018-12-12 ENCOUNTER — Encounter (HOSPITAL_COMMUNITY): Payer: Medicare Other

## 2018-12-15 ENCOUNTER — Encounter (INDEPENDENT_AMBULATORY_CARE_PROVIDER_SITE_OTHER): Payer: Self-pay | Admitting: Physical Medicine and Rehabilitation

## 2018-12-15 ENCOUNTER — Ambulatory Visit (INDEPENDENT_AMBULATORY_CARE_PROVIDER_SITE_OTHER): Payer: Medicare Other | Admitting: Physical Medicine and Rehabilitation

## 2018-12-15 VITALS — BP 143/69 | HR 79 | Ht 62.0 in | Wt 102.0 lb

## 2018-12-15 DIAGNOSIS — G8929 Other chronic pain: Secondary | ICD-10-CM

## 2018-12-15 DIAGNOSIS — M545 Low back pain: Secondary | ICD-10-CM | POA: Diagnosis not present

## 2018-12-15 DIAGNOSIS — M609 Myositis, unspecified: Secondary | ICD-10-CM

## 2018-12-15 DIAGNOSIS — M47816 Spondylosis without myelopathy or radiculopathy, lumbar region: Secondary | ICD-10-CM

## 2018-12-15 DIAGNOSIS — M4125 Other idiopathic scoliosis, thoracolumbar region: Secondary | ICD-10-CM | POA: Diagnosis not present

## 2018-12-15 NOTE — Progress Notes (Signed)
.  Numeric Pain Rating Scale and Functional Assessment Average Pain 6 Pain Right Now 3 My pain is constant and dull Pain is worse with: walking, bending, standing and some activites Pain improves with: medication   In the last MONTH (on 0-10 scale) has pain interfered with the following?  1. General activity like being  able to carry out your everyday physical activities such as walking, climbing stairs, carrying groceries, or moving a chair?  Rating(7)  2. Relation with others like being able to carry out your usual social activities and roles such as  activities at home, at work and in your community. Rating(7)  3. Enjoyment of life such that you have  been bothered by emotional problems such as feeling anxious, depressed or irritable?  Rating(4)

## 2018-12-29 ENCOUNTER — Other Ambulatory Visit (INDEPENDENT_AMBULATORY_CARE_PROVIDER_SITE_OTHER): Payer: Self-pay | Admitting: Physical Medicine and Rehabilitation

## 2018-12-29 DIAGNOSIS — F411 Generalized anxiety disorder: Secondary | ICD-10-CM

## 2018-12-29 MED ORDER — DIAZEPAM 5 MG PO TABS: ORAL_TABLET | ORAL | 0 refills | Status: DC

## 2018-12-29 NOTE — Progress Notes (Signed)
Pre-procedure diazepam ordered for pre-operative anxiety.  

## 2019-01-02 ENCOUNTER — Ambulatory Visit (INDEPENDENT_AMBULATORY_CARE_PROVIDER_SITE_OTHER): Payer: Self-pay

## 2019-01-02 ENCOUNTER — Ambulatory Visit (INDEPENDENT_AMBULATORY_CARE_PROVIDER_SITE_OTHER): Payer: Medicare Other | Admitting: Physical Medicine and Rehabilitation

## 2019-01-02 ENCOUNTER — Encounter (INDEPENDENT_AMBULATORY_CARE_PROVIDER_SITE_OTHER): Payer: Self-pay | Admitting: Physical Medicine and Rehabilitation

## 2019-01-02 VITALS — BP 114/57 | HR 81 | Temp 98.3°F

## 2019-01-02 DIAGNOSIS — M47816 Spondylosis without myelopathy or radiculopathy, lumbar region: Secondary | ICD-10-CM

## 2019-01-02 MED ORDER — BUPIVACAINE HCL 0.5 % IJ SOLN
3.0000 mL | Freq: Once | INTRAMUSCULAR | Status: AC
  Administered 2019-01-02: 3 mL

## 2019-01-02 NOTE — Progress Notes (Signed)
.  Numeric Pain Rating Scale and Functional Assessment Average Pain 10   In the last MONTH (on 0-10 scale) has pain interfered with the following?  1. General activity like being  able to carry out your everyday physical activities such as walking, climbing stairs, carrying groceries, or moving a chair?  Rating(5)   +Driver, -BT, -Dye Allergies.  

## 2019-01-04 ENCOUNTER — Telehealth (INDEPENDENT_AMBULATORY_CARE_PROVIDER_SITE_OTHER): Payer: Self-pay | Admitting: Physical Medicine and Rehabilitation

## 2019-01-04 NOTE — Telephone Encounter (Signed)
Yes, let her know from insurance standpoint they need to see two successful blocks

## 2019-01-04 NOTE — Telephone Encounter (Signed)
Scheduled for 3/2 at 1330.

## 2019-01-05 ENCOUNTER — Telehealth (INDEPENDENT_AMBULATORY_CARE_PROVIDER_SITE_OTHER): Payer: Self-pay | Admitting: *Deleted

## 2019-01-05 NOTE — Telephone Encounter (Signed)
Per automated voice [employer] no pa is needed for [employer] medicare supplement. CB# [phone]

## 2019-01-08 NOTE — Progress Notes (Signed)
Catherine Harrington - 73 y.o. female MRN [id number]  Date of birth: 1946-02-11  Office Visit Note: Visit Date: 01/02/2019 PCP: Nila Nephew, MD Referred by: Nila Nephew, MD  Subjective: No chief complaint on file.  HPI:  Catherine Harrington is a 73 y.o. female who comes in today For planned medial branch blocks of the bilateral L3-4, L4-5 and L5-S1 facet joints.  Please see our prior evaluation and management note for further details and justification.  ROS Otherwise per HPI.  Assessment & Plan: Visit Diagnoses:  1. Spondylosis without myelopathy or radiculopathy, lumbar region     Plan: No additional findings.   Meds & Orders:  Meds ordered this encounter  Medications  . bupivacaine (MARCAINE) 0.5 % (with pres) injection 3 mL    Orders Placed This Encounter  Procedures  . Facet Injection  . XR C-ARM NO REPORT    Follow-up: Return if symptoms worsen or fail to improve, for Review Pain Diary.   Procedures: No procedures performed  Lumbar Diagnostic Facet Joint Nerve Block with Fluoroscopic Guidance   Patient: Catherine Harrington      Date of Birth: 11-24-1946 MRN: [id number] PCP: Nila Nephew, MD      Visit Date: 01/02/2019   Universal Protocol:    Date/Time: 02/10/206:25 AM  Consent Given By: the patient  Position: PRONE  Additional Comments: Vital signs were monitored before and after the procedure. Patient was prepped and draped in the usual sterile fashion. The correct patient, procedure, and site was verified.   Injection Procedure Details:  Procedure Site One Meds Administered:  Meds ordered this encounter  Medications  . bupivacaine (MARCAINE) 0.5 % (with pres) injection 3 mL     Laterality: Bilateral  Location/Site:  L3-L4 L4-L5 L5-S1  Needle size: 22 ga.  Needle type:spinal  Needle Placement: Oblique pedical  Findings:   -Comments: There was excellent flow of contrast along the articular pillars without intravascular flow.  Procedure  Details: The fluoroscope beam is vertically oriented in AP and then obliqued 15 to 20 degrees to the ipsilateral side of the desired nerve to achieve the "Scotty dog" appearance.  The skin over the target area of the junction of the superior articulating process and the transverse process (sacral ala if blocking the L5 dorsal rami) was locally anesthetized with a 1 ml volume of 1% Lidocaine without Epinephrine.  The spinal needle was inserted and advanced in a trajectory view down to the target.   After contact with periosteum and negative aspirate for blood and CSF, correct placement without intravascular or epidural spread was confirmed by injecting 0.5 ml. of Isovue-250.  A spot radiograph was obtained of this image.    Next, a 0.5 ml. volume of the injectate described above was injected. The needle was then redirected to the other facet joint nerves mentioned above if needed.  Prior to the procedure, the patient was given a Pain Diary which was completed for baseline measurements.  After the procedure, the patient rated their pain every 30 minutes and will continue rating at this frequency for a total of 5 hours.  The patient has been asked to complete the Diary and return to [place] by mail, fax or hand delivered as soon as possible.   Additional Comments:  The patient tolerated the procedure well Dressing: Band-Aid    Post-procedure details: Patient was observed during the procedure. Post-procedure instructions were reviewed.  Patient left the clinic in stable condition.   Clinical History: MRI LUMBAR SPINE WITHOUT CONTRAST 05/09/2017  TECHNIQUE: Multiplanar, multisequence MR imaging of the lumbar spine was performed. No intravenous contrast was administered.  COMPARISON:  Lumbar spine MRI 06/11/2014  FINDINGS: Segmentation:  Normal  Alignment:  There is right convex lumbar scoliosis with apex at L3.  Vertebrae: Bone marrow signal is heterogeneous, unchanged. No evidence of  discitis osteomyelitis. No acute compression fracture.  Conus medullaris: Extends to the L1 level and appears normal.  Paraspinal and other soft tissues: There is fatty atrophy of the posterior paraspinous muscles, slightly worse on the left.  Disc levels:  T9-T12: These levels are assessed on sagittal sequences only. No disc herniation, spinal canal stenosis or neural foraminal narrowing.  T12-L1: Normal disc space and facets. No spinal canal or neuroforaminal stenosis.  L1-L2: Severe disc space narrowing with minimal bulge. Moderate right and mild left neural foraminal narrowing, primarily caused by scoliotic curvature.  L2-L3: Severe disc space loss with endplate osteophytosis. No spinal canal stenosis. Moderate right and severe left neural foraminal stenosis, unchanged. Left-greater-than-right facet arthrosis.  L3-L4: Severe disc space narrowing. No spinal canal stenosis. There is unchanged severe left foraminal stenosis. Right neural foramen is widely patent.  L4-L5: Disc space narrowing with central disc protrusion, unchanged. Severe bilateral facet hypertrophy, also unchanged. No central spinal canal stenosis. Severe right and moderate left neural foraminal stenosis, unchanged.  L5-S1: Moderate bilateral facet hypertrophy but no stenosis.  Visualized sacrum: Normal.  IMPRESSION: 1. Unchanged appearance of marked lumbar dextroscoliosis, which, in combination degenerative disc disease and facet arthrosis, that leads to severe neural foraminal narrowing at multiple levels, including L2-3, L3-4 and L4-5. These findings are unchanged, however, compared to the MRI of 06/11/2014. 2. No central spinal canal stenosis of the lumbar spine. 3. Multilevel moderate to severe facet arthrosis, which may serve as a source of localized back pain.  Lspine MRI 06/11/14 There is moderate lumbar dextroscoliosis with apex at L3. There is left lateral listhesis of L2 on L3  and right lateral listhesis of L3 on L4 and L4 on L5. Bone marrow signal is somewhat heterogeneous diffusely and overall decreased in signal in the lower thoracic spine (possibly reflecting patient's history of anemia) with prominent fatty degenerative marrow changes in the mid and lower lumbar spine. Modic type 1 degenerative marrow changes are also noted in this region. There is lateral recess narrowing at L2-3 on the right.     Objective:  VS:  HT:    WT:   BMI:     BP:(!) 114/57  HR:81bpm  TEMP:98.3 F (36.8 C)(Oral)  RESP:  Physical Exam  Ortho Exam Imaging: No results found.

## 2019-01-08 NOTE — Procedures (Signed)
Lumbar Diagnostic Facet Joint Nerve Block with Fluoroscopic Guidance   Patient: Catherine Harrington      Date of Birth: 10/01/1946 MRN: [id number] PCP: Nila NephewGreen, Edwin, MD      Visit Date: 01/02/2019   Universal Protocol:    Date/Time: 02/10/206:25 AM  Consent Given By: the patient  Position: PRONE  Additional Comments: Vital signs were monitored before and after the procedure. Patient was prepped and draped in the usual sterile fashion. The correct patient, procedure, and site was verified.   Injection Procedure Details:  Procedure Site One Meds Administered:  Meds ordered this encounter  Medications  . bupivacaine (MARCAINE) 0.5 % (with pres) injection 3 mL     Laterality: Bilateral  Location/Site:  L3-L4 L4-L5 L5-S1  Needle size: 22 ga.  Needle type:spinal  Needle Placement: Oblique pedical  Findings:   -Comments: There was excellent flow of contrast along the articular pillars without intravascular flow.  Procedure Details: The fluoroscope beam is vertically oriented in AP and then obliqued 15 to 20 degrees to the ipsilateral side of the desired nerve to achieve the "Scotty dog" appearance.  The skin over the target area of the junction of the superior articulating process and the transverse process (sacral ala if blocking the L5 dorsal rami) was locally anesthetized with a 1 ml volume of 1% Lidocaine without Epinephrine.  The spinal needle was inserted and advanced in a trajectory view down to the target.   After contact with periosteum and negative aspirate for blood and CSF, correct placement without intravascular or epidural spread was confirmed by injecting 0.5 ml. of Isovue-250.  A spot radiograph was obtained of this image.    Next, a 0.5 ml. volume of the injectate described above was injected. The needle was then redirected to the other facet joint nerves mentioned above if needed.  Prior to the procedure, the patient was given a Pain Diary which was completed  for baseline measurements.  After the procedure, the patient rated their pain every 30 minutes and will continue rating at this frequency for a total of 5 hours.  The patient has been asked to complete the Diary and return to us by mail, fax or hand delivered as soon as possible.   Additional Comments:  The patient tolerated the procedure well Dressing: Band-Aid    Post-procedure details: Patient was observed during the procedure. Post-procedure instructions were reviewed.  Patient left the clinic in stable condition.

## 2019-01-23 ENCOUNTER — Ambulatory Visit: Payer: Medicare Other | Admitting: Oncology

## 2019-01-26 ENCOUNTER — Other Ambulatory Visit (HOSPITAL_COMMUNITY): Payer: Self-pay | Admitting: General Practice

## 2019-01-26 ENCOUNTER — Other Ambulatory Visit: Payer: Self-pay | Admitting: Oncology

## 2019-01-26 ENCOUNTER — Telehealth: Payer: Self-pay | Admitting: Hematology

## 2019-01-26 ENCOUNTER — Encounter: Payer: Self-pay | Admitting: Hematology

## 2019-01-26 NOTE — Telephone Encounter (Signed)
A new hem referral has been scheduled fort he pt to see Dr. Mosetta Putt on 4/16 at 10:15am. Letter mailed tot he pt.

## 2019-01-29 ENCOUNTER — Ambulatory Visit (INDEPENDENT_AMBULATORY_CARE_PROVIDER_SITE_OTHER): Payer: Self-pay

## 2019-01-29 ENCOUNTER — Ambulatory Visit (INDEPENDENT_AMBULATORY_CARE_PROVIDER_SITE_OTHER): Payer: Medicare Other | Admitting: Physical Medicine and Rehabilitation

## 2019-01-29 ENCOUNTER — Encounter (INDEPENDENT_AMBULATORY_CARE_PROVIDER_SITE_OTHER): Payer: Self-pay | Admitting: Physical Medicine and Rehabilitation

## 2019-01-29 VITALS — BP 135/68 | HR 90 | Temp 97.5°F

## 2019-01-29 DIAGNOSIS — M47816 Spondylosis without myelopathy or radiculopathy, lumbar region: Secondary | ICD-10-CM | POA: Diagnosis not present

## 2019-01-29 DIAGNOSIS — M545 Low back pain: Secondary | ICD-10-CM

## 2019-01-29 DIAGNOSIS — M4186 Other forms of scoliosis, lumbar region: Secondary | ICD-10-CM | POA: Diagnosis not present

## 2019-01-29 MED ORDER — BUPIVACAINE HCL 0.5 % IJ SOLN: 3.0000 mL | Freq: Once | INTRAMUSCULAR | Status: DC

## 2019-01-29 NOTE — Progress Notes (Signed)
.  Numeric Pain Rating Scale and Functional Assessment Average Pain 6   In the last MONTH (on 0-10 scale) has pain interfered with the following?  1. General activity like being  able to carry out your everyday physical activities such as walking, climbing stairs, carrying groceries, or moving a chair?  Rating(5)   +Driver, -BT, -Dye Allergies.  

## 2019-01-30 NOTE — Procedures (Signed)
Lumbar Diagnostic Facet Joint Nerve Block with Fluoroscopic Guidance   Patient: Catherine Harrington      Date of Birth: [birth year] MRN: [id number] PCP: Nila Nephew, MD      Visit Date: 01/29/2019   Universal Protocol:    Date/Time: 03/03/205:53 AM  Consent Given By: the patient  Position: PRONE  Additional Comments: Vital signs were monitored before and after the procedure. Patient was prepped and draped in the usual sterile fashion. The correct patient, procedure, and site was verified.   Injection Procedure Details:  Procedure Site One Meds Administered:  Meds ordered this encounter  Medications  . bupivacaine (MARCAINE) 0.5 % (with pres) injection 3 mL     Laterality: Bilateral  Location/Site:  L3-L4 L4-L5 L5-S1  Needle size: 22 ga.  Needle type:spinal  Needle Placement: Oblique pedical  Findings:   -Comments: There was excellent flow of contrast along the articular pillars without intravascular flow.  Procedure Details: The fluoroscope beam is vertically oriented in AP and then obliqued 15 to 20 degrees to the ipsilateral side of the desired nerve to achieve the "Scotty dog" appearance.  The skin over the target area of the junction of the superior articulating process and the transverse process (sacral ala if blocking the L5 dorsal rami) was locally anesthetized with a 1 ml volume of 1% Lidocaine without Epinephrine.  The spinal needle was inserted and advanced in a trajectory view down to the target.   After contact with periosteum and negative aspirate for blood and CSF, correct placement without intravascular or epidural spread was confirmed by injecting 0.5 ml. of Isovue-250.  A spot radiograph was obtained of this image.    Next, a 0.5 ml. volume of the injectate described above was injected. The needle was then redirected to the other facet joint nerves mentioned above if needed.  Prior to the procedure, the patient was given a Pain Diary which was completed  for baseline measurements.  After the procedure, the patient rated their pain every 30 minutes and will continue rating at this frequency for a total of 5 hours.  The patient has been asked to complete the Diary and return to [place] by mail, fax or hand delivered as soon as possible.   Additional Comments:  The patient tolerated the procedure well Dressing: 2 x 2 sterile gauze and Band-Aid    Post-procedure details: Patient was observed during the procedure. Post-procedure instructions were reviewed.  Patient left the clinic in stable condition.

## 2019-01-30 NOTE — Progress Notes (Signed)
Catherine Harrington - 73 y.o. female MRN [id number]  Date of birth: February 14, 1946  Office Visit Note: Visit Date: 01/29/2019 PCP: Nila Nephew, MD Referred by: Nila Nephew, MD  Subjective: Chief Complaint  Patient presents with  . Lower Back - Pain   HPI: Catherine Harrington is a 73 y.o. female who comes in today For planned second diagnostic medial branch block of the L3-4 L4-5 and L5-S1 facet joints.  Patient has pretty severe lumbar degenerative scoliosis with history of chronic pain syndrome.  For whatever reason today not discussed with her husband who is here today she is having just a lot of all over body pain and we try to lay on the table she is having some abdominal pain that she says occurs all the time but we have never heard about this before.  She carries a diagnosis of lupus but has not seen a rheumatologist for a long time.  She is pretty distrusting of most healthcare practitioners in general.  She has had some health issues over the last few years including hemochromatosis she continues to smoke.  She is a very frail individual at 102 pounds.  We will go ahead complete today the diagnostic medial branch blocks and see if we can get good diagnostic quality from that even though she is hurting all over.  Would likely refer her to Dr. Corliss Skains may be Dr. Zenovia Jordan.  ROS Otherwise per HPI.  Assessment & Plan: Visit Diagnoses:  1. Spondylosis without myelopathy or radiculopathy, lumbar region     Plan: No additional findings.   Meds & Orders:  Meds ordered this encounter  Medications  . bupivacaine (MARCAINE) 0.5 % (with pres) injection 3 mL    Orders Placed This Encounter  Procedures  . Facet Injection  . XR C-ARM NO REPORT    Follow-up: Return if symptoms worsen or fail to improve.   Procedures: No procedures performed  Lumbar Diagnostic Facet Joint Nerve Block with Fluoroscopic Guidance   Patient: Catherine Harrington      Date of Birth: [birth year] MRN: [id number] PCP:  Nila Nephew, MD      Visit Date: 01/29/2019   Universal Protocol:    Date/Time: 03/03/205:53 AM  Consent Given By: the patient  Position: PRONE  Additional Comments: Vital signs were monitored before and after the procedure. Patient was prepped and draped in the usual sterile fashion. The correct patient, procedure, and site was verified.   Injection Procedure Details:  Procedure Site One Meds Administered:  Meds ordered this encounter  Medications  . bupivacaine (MARCAINE) 0.5 % (with pres) injection 3 mL     Laterality: Bilateral  Location/Site:  L3-L4 L4-L5 L5-S1  Needle size: 22 ga.  Needle type:spinal  Needle Placement: Oblique pedical  Findings:   -Comments: There was excellent flow of contrast along the articular pillars without intravascular flow.  Procedure Details: The fluoroscope beam is vertically oriented in AP and then obliqued 15 to 20 degrees to the ipsilateral side of the desired nerve to achieve the "Scotty dog" appearance.  The skin over the target area of the junction of the superior articulating process and the transverse process (sacral ala if blocking the L5 dorsal rami) was locally anesthetized with a 1 ml volume of 1% Lidocaine without Epinephrine.  The spinal needle was inserted and advanced in a trajectory view down to the target.   After contact with periosteum and negative aspirate for blood and CSF, correct placement without intravascular or epidural spread was confirmed by injecting  0.5 ml. of Isovue-250.  A spot radiograph was obtained of this image.    Next, a 0.5 ml. volume of the injectate described above was injected. The needle was then redirected to the other facet joint nerves mentioned above if needed.  Prior to the procedure, the patient was given a Pain Diary which was completed for baseline measurements.  After the procedure, the patient rated their pain every 30 minutes and will continue rating at this frequency for a total  of 5 hours.  The patient has been asked to complete the Diary and return to [place] by mail, fax or hand delivered as soon as possible.   Additional Comments:  The patient tolerated the procedure well Dressing: 2 x 2 sterile gauze and Band-Aid    Post-procedure details: Patient was observed during the procedure. Post-procedure instructions were reviewed.  Patient left the clinic in stable condition.   Clinical History: MRI LUMBAR SPINE WITHOUT CONTRAST 05/09/2017  TECHNIQUE: Multiplanar, multisequence MR imaging of the lumbar spine was performed. No intravenous contrast was administered.  COMPARISON:  Lumbar spine MRI 06/11/2014  FINDINGS: Segmentation:  Normal  Alignment:  There is right convex lumbar scoliosis with apex at L3.  Vertebrae: Bone marrow signal is heterogeneous, unchanged. No evidence of discitis osteomyelitis. No acute compression fracture.  Conus medullaris: Extends to the L1 level and appears normal.  Paraspinal and other soft tissues: There is fatty atrophy of the posterior paraspinous muscles, slightly worse on the left.  Disc levels:  T9-T12: These levels are assessed on sagittal sequences only. No disc herniation, spinal canal stenosis or neural foraminal narrowing.  T12-L1: Normal disc space and facets. No spinal canal or neuroforaminal stenosis.  L1-L2: Severe disc space narrowing with minimal bulge. Moderate right and mild left neural foraminal narrowing, primarily caused by scoliotic curvature.  L2-L3: Severe disc space loss with endplate osteophytosis. No spinal canal stenosis. Moderate right and severe left neural foraminal stenosis, unchanged. Left-greater-than-right facet arthrosis.  L3-L4: Severe disc space narrowing. No spinal canal stenosis. There is unchanged severe left foraminal stenosis. Right neural foramen is widely patent.  L4-L5: Disc space narrowing with central disc protrusion, unchanged. Severe bilateral  facet hypertrophy, also unchanged. No central spinal canal stenosis. Severe right and moderate left neural foraminal stenosis, unchanged.  L5-S1: Moderate bilateral facet hypertrophy but no stenosis.  Visualized sacrum: Normal.  IMPRESSION: 1. Unchanged appearance of marked lumbar dextroscoliosis, which, in combination degenerative disc disease and facet arthrosis, that leads to severe neural foraminal narrowing at multiple levels, including L2-3, L3-4 and L4-5. These findings are unchanged, however, compared to the MRI of 06/11/2014. 2. No central spinal canal stenosis of the lumbar spine. 3. Multilevel moderate to severe facet arthrosis, which may serve as a source of localized back pain.  Lspine MRI 06/11/14 There is moderate lumbar dextroscoliosis with apex at L3. There is left lateral listhesis of L2 on L3 and right lateral listhesis of L3 on L4 and L4 on L5. Bone marrow signal is somewhat heterogeneous diffusely and overall decreased in signal in the lower thoracic spine (possibly reflecting patient's history of anemia) with prominent fatty degenerative marrow changes in the mid and lower lumbar spine. Modic type 1 degenerative marrow changes are also noted in this region. There is lateral recess narrowing at L2-3 on the right.   She reports that she has been smoking cigarettes. She has a 48.00 pack-year smoking history. She has never used smokeless tobacco. No results for input(s): HGBA1C, LABURIC in the last 8760 hours.  Objective:  VS:  HT:    WT:   BMI:     BP:135/68  HR:90bpm  TEMP:(!) 97.5 F (36.4 C)(Oral)  RESP:  Physical Exam  Ortho Exam Imaging: Xr C-arm No Report  Result Date: 01/29/2019 Please see Notes tab for imaging impression.   Past Medical/Family/Surgical/Social History: Medications & Allergies reviewed per EMR, new medications updated. Patient Active Problem List   Diagnosis Date Noted  . Fibrosis of liver 04/18/2018  . Macrocytic anemia  06/29/2016  . Chronic renal insufficiency, stage III (moderate) (HCC) 06/29/2016  . Acute right flank pain 06/29/2016  . Chronic renal insufficiency 06/29/2016  . Hemochromatosis 05/18/2016  . 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 Macrocytosis 08/16/2013  . Syncope 08/16/2013  . DIVERTICULOSIS, COLON 11/04/2010   Past Medical History:  Diagnosis Date  . Acute right flank pain 06/29/2016  . Arthritis    "related to the lupus; joints" (08/16/2013)  . Chronic renal insufficiency, stage III (moderate) (HCC) 06/29/2016  . 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 Family history of anesthesia complication    "daughter doesn't wake up easy from it" (08/16/2013)  . Fibrosis of liver 04/18/2018  . 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 Hypertension   . Iron deficiency anemia   . Lumbar disc disease   . Lupus (HCC)    "just the rash and arthritis" (08/16/2013)  . Macrocytic anemia 06/29/2016  . 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 Scoliosis   . Syncope and collapse    "lost consciousness ~ 4-5 seconds; didn't hurt myself" (08/16/2013)   Family History  Problem Relation Age of Onset  . Hypertension Mother   . Hypertension Father    Past Surgical History:  Procedure Laterality Date  . CATARACT EXTRACTION Bilateral 10/19 11/19  . CESAREAN SECTION  1967; 1971  . MOUTH SURGERY  2013   " infection under bridge cleaned out, etc" (08/16/2013)  . TUBAL LIGATION     Social History   Occupational History  . Occupation: retired  Tobacco Use  . Smoking status: Current Every Day Smoker    Packs/day: 1.00    Years: 48.00    Pack years: 48.00    Types: Cigarettes  . Smokeless tobacco: Never Used  Substance and Sexual Activity  . Alcohol use: Yes    Alcohol/week: 21.0 standard drinks    Types: 21 Cans of beer per week    Comment:  3-4 drinks daily sometimes.  . Drug use: No  . Sexual activity: Not Currently      Partners: Male    Birth control/protection: Post-menopausal

## 2019-02-01 ENCOUNTER — Telehealth: Payer: Self-pay | Admitting: Hematology

## 2019-02-01 NOTE — Telephone Encounter (Signed)
Pt cld to reschedule appt on 4/16 at a later appt date and time. Rescheduled to 3pm w/labs at 245pm

## 2019-02-05 ENCOUNTER — Telehealth (INDEPENDENT_AMBULATORY_CARE_PROVIDER_SITE_OTHER): Payer: Self-pay | Admitting: *Deleted

## 2019-02-05 NOTE — Telephone Encounter (Signed)
Submitted Prior Auth for [id number] and [id number] to [phone] to [facility].

## 2019-02-06 ENCOUNTER — Other Ambulatory Visit (INDEPENDENT_AMBULATORY_CARE_PROVIDER_SITE_OTHER): Payer: Self-pay | Admitting: Physical Medicine and Rehabilitation

## 2019-02-06 ENCOUNTER — Encounter (HOSPITAL_COMMUNITY): Payer: Self-pay

## 2019-02-06 ENCOUNTER — Encounter (HOSPITAL_COMMUNITY)
Admission: RE | Admit: 2019-02-06 | Discharge: 2019-02-06 | Disposition: A | Discharge status: Home / Self Care | Payer: Medicare Other | Source: Ambulatory Visit | Attending: Oncology | Admitting: Oncology

## 2019-02-06 DIAGNOSIS — L930 Discoid lupus erythematosus: Secondary | ICD-10-CM

## 2019-02-06 LAB — HEMOGLOBIN: HEMOGLOBIN: 11.7 g/dL — AB (ref 12.0–15.0)

## 2019-02-06 MED ORDER — SODIUM CHLORIDE 0.9 % IV BOLUS
250.0000 mL | Freq: Once | INTRAVENOUS | Status: AC
  Administered 2019-02-06: 250 mL via INTRAVENOUS

## 2019-02-06 MED ORDER — TRIAZOLAM 0.25 MG PO TABS: ORAL_TABLET | ORAL | 0 refills | Status: DC

## 2019-02-06 NOTE — Progress Notes (Signed)
Leanor Kail presents today for phlebotomy per MD orders. HGB: 11.7 Phlebotomy procedure started at 1047 and ended at 1112. 300 cc removed. Patient tolerated procedure well. IV needle removed intact. Vss, before and after procedure.   250cc NS bolus was given IV before phlebotomy was performed.   No lightheadedness or dizziness reported after procedure.  Pt taken to lobby via wheelchair by husband.

## 2019-02-06 NOTE — Telephone Encounter (Signed)
Done, sent in triazolam

## 2019-02-06 NOTE — Telephone Encounter (Signed)
Called pt and advised.  

## 2019-02-09 ENCOUNTER — Encounter: Payer: Self-pay | Admitting: *Deleted

## 2019-02-12 ENCOUNTER — Ambulatory Visit (INDEPENDENT_AMBULATORY_CARE_PROVIDER_SITE_OTHER): Payer: Medicare Other | Admitting: Oncology

## 2019-02-12 ENCOUNTER — Other Ambulatory Visit: Payer: Self-pay

## 2019-02-12 ENCOUNTER — Encounter: Payer: Self-pay | Admitting: Oncology

## 2019-02-12 DIAGNOSIS — Z79890 Hormone replacement therapy (postmenopausal): Secondary | ICD-10-CM

## 2019-02-12 DIAGNOSIS — M549 Dorsalgia, unspecified: Secondary | ICD-10-CM | POA: Diagnosis not present

## 2019-02-12 DIAGNOSIS — Z888 Allergy status to other drugs, medicaments and biological substances status: Secondary | ICD-10-CM | POA: Diagnosis not present

## 2019-02-12 DIAGNOSIS — G8929 Other chronic pain: Secondary | ICD-10-CM

## 2019-02-12 DIAGNOSIS — N183 Chronic kidney disease, stage 3 (moderate): Secondary | ICD-10-CM

## 2019-02-12 DIAGNOSIS — E039 Hypothyroidism, unspecified: Secondary | ICD-10-CM | POA: Diagnosis not present

## 2019-02-12 DIAGNOSIS — D539 Nutritional anemia, unspecified: Secondary | ICD-10-CM

## 2019-02-12 DIAGNOSIS — Z886 Allergy status to analgesic agent status: Secondary | ICD-10-CM | POA: Diagnosis not present

## 2019-02-12 DIAGNOSIS — M419 Scoliosis, unspecified: Secondary | ICD-10-CM | POA: Diagnosis not present

## 2019-02-12 DIAGNOSIS — L930 Discoid lupus erythematosus: Secondary | ICD-10-CM | POA: Diagnosis not present

## 2019-02-12 DIAGNOSIS — Z7289 Other problems related to lifestyle: Secondary | ICD-10-CM | POA: Diagnosis not present

## 2019-02-12 DIAGNOSIS — F17210 Nicotine dependence, cigarettes, uncomplicated: Secondary | ICD-10-CM

## 2019-02-12 DIAGNOSIS — Z882 Allergy status to sulfonamides status: Secondary | ICD-10-CM | POA: Diagnosis not present

## 2019-02-12 DIAGNOSIS — M479 Spondylosis, unspecified: Secondary | ICD-10-CM | POA: Diagnosis not present

## 2019-02-12 DIAGNOSIS — Z8669 Personal history of other diseases of the nervous system and sense organs: Secondary | ICD-10-CM | POA: Diagnosis not present

## 2019-02-12 NOTE — Patient Instructions (Signed)
To lab today  Keep your appointment at the [facility] with Dr Mosetta Putt  It has been a pleasure working with you!  DrG

## 2019-02-12 NOTE — Progress Notes (Signed)
Hematology and Oncology Follow Up Visit  Catherine Harrington [id number] 10-20-1946 74 y.o. 02/12/2019 1:40 PM   Principle Diagnosis: Encounter Diagnoses  Name Primary?  . Discoid lupus erythematosus   . Hereditary hemochromatosis ([place]) Yes  Clinical summary: 73 year old woman referred for further evaluation of chronic transaminase elevations with findings of increased iron, iron saturation, and ferritin. 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 Interpretation of the ferritin is confounded by an underlying collagen vascular disorder which might lead to chronic inflammation and potentially autoimmune hepatitis, and possible coexisting liver disease from long-standing alcohol use. 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 I repeated a ferritin which confirmedthat she is in a range that is associated with organ deposition/damage at 1308. Serum iron 188. Percent saturation 82%. Ultrasound of the liver with additional fibro-elastography showeda homogeneous pattern to the liver parenchyma with no focal lesions. No splenomegaly. (No signs of portal hypertension). But there were areas of 3 and 4+ fibrosis. We discussed options to decrease her iron burden. I recommended a trial of phlebotomy but we also discussed potential medical therapy with an iron chelating agent. Attempt at phlebotomy done on 07/06/2016. There was technical difficulty getting a large vein and she experienced significant pain. The procedure was aborted.I initiated the groundwork necessary to start somebody on oral iron chelation withdesferasirox, brand name Jadenu.She had formal ophthalmology exam with slit lamp exam which was fortuitous since she was diagnosed with early glaucoma and started on treatment. She had a hearing test due to the potential of the drug  to affect hearing as well as vision. Urine screen to establish that she had no proteinuria at baseline. At the end of the day, the biggest obstacle was financial. Medicare would only cover a fraction of the cost. At time of most recent visit with me on January 22nd a "root cause analysis" of the problemsshe had with the first phlebotomy was done, and she,together with her husband,decided she would try phlebotomy again at a different location with more experienced personnel, premedication with lorazepam, and local lidocaine anesthesia. In view of pre-existing anemia I limited the phlebotomy to 300 mL's. She had the first procedure on January 24 and everything went well. Shecontinueson a modified monthly phlebotomy program with a slow but steady decline in her ferritin.   Interim History:    From the hemochromatosis point she continues to do well.  Phlebotomies currently every 3 months.  Most recent ferritin level done on October 17, 2018 was 71.  Most recent phlebotomy was February 06, 2019. She is under ongoing evaluation by orthopedic surgery, Dr. Ernestina Patches, for chronic back pain and scoliosis.  She has had a few spinal injections as a preliminary to a complete nerve block which is planned in the near future. No other systemic complaints today.  Medications: reviewed  Allergies:  Allergies  Allergen Reactions  . Prednisone Anaphylaxis    Does ok with intra-muscular and epidural steroid  . Sulfonamide Derivatives     REACTION: throat swells  . Epinephrine Palpitations    In lidocaine gave her "flutters" per dentist  . Midol [Ibuprofen]     Review of Systems: See interim history Remaining ROS negative:   Physical Exam: Blood pressure (!) 119/48, pulse 88, temperature 98.1 F (36.7 C), temperature source Oral, height 5' 2"  (1.575 m), weight 103 lb 9.6  oz (47 kg), SpO2 97 %. Wt Readings from Last 3 Encounters:  02/12/19 103 lb 9.6 oz (47 kg)  12/15/18 102 lb (46.3 kg)  10/03/18  105 lb 3.2 oz (47.7 kg)     General appearance: Thin Caucasian woman HENNT: Pharynx no erythema, exudate, mass, or ulcer. No thyromegaly or thyroid nodules Lymph nodes: No cervical, supraclavicular, or axillary lymphadenopathy Breasts:  Lungs: Clear to auscultation, resonant to percussion throughout Heart: Regular rhythm, no murmur, no gallop, no rub, no click, no edema Abdomen: Soft, nontender, normal bowel sounds, no mass, no organomegaly Extremities: No edema, no calf tenderness Musculoskeletal: Spine deformity from scoliosis GU:  Vascular: Carotid pulses 2+, no bruits, symmetric Neurologic: Alert, oriented, PERRLA,, cranial nerves grossly normal, motor strength 5 over 5, reflexes 1+ symmetric, upper body coordination normal, gait normal, Skin: No rash or ecchymosis  Lab Results: CBC W/Diff    Component Value Date/Time   WBC 3.5 (L) 10/17/2018 1008   RBC 3.09 (L) 10/17/2018 1008   HGB 11.7 (L) 02/06/2019 1009   HGB 10.5 (L) 04/18/2018 1424   HCT 33.8 (L) 10/17/2018 1008   HCT 31.6 (L) 04/18/2018 1424   PLT 160 10/17/2018 1008   PLT 176 04/18/2018 1424   MCV 109.4 (H) 10/17/2018 1008   MCV 107 (H) 04/18/2018 1424   MCH 35.9 (H) 10/17/2018 1008   MCHC 32.8 10/17/2018 1008   RDW 13.2 10/17/2018 1008   RDW 13.1 04/18/2018 1424   LYMPHSABS 1.2 10/17/2018 1008   LYMPHSABS 1.5 04/18/2018 1424   MONOABS 0.3 10/17/2018 1008   EOSABS 0.0 10/17/2018 1008   EOSABS 0.0 04/18/2018 1424   BASOSABS 0.0 10/17/2018 1008   BASOSABS 0.0 04/18/2018 1424     Chemistry      Component Value Date/Time   NA 137 10/17/2018 1008   NA 141 04/18/2018 1424   K 4.3 10/17/2018 1008   CL 104 10/17/2018 1008   CO2 24 10/17/2018 1008   BUN 12 10/17/2018 1008   BUN 15 04/18/2018 1424   CREATININE 1.14 (H) 10/17/2018 1008      Component Value Date/Time   CALCIUM 9.4 10/17/2018 1008   ALKPHOS 49 10/17/2018 1008   AST 56 (H) 10/17/2018 1008   ALT 32 10/17/2018 1008   BILITOT 1.0 10/17/2018  1008   BILITOT 0.3 04/18/2018 1424    Today's lab pending   Radiological Studies: Xr C-arm No Report  Result Date: 01/29/2019 Please see Notes tab for imaging impression.   Impression:  1.  Multifactorial iron overload syndrome with components of hereditary hemochromatosis, heterozygote status for the C282Y hemochromatosis gene, possible contribution from underlying collagen vascular disorder (discoid lupus) but normal ESR.  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 Significant improvement in liver functions with normalization of ferritin secondary to ongoing phlebotomy program. Currently getting phlebotomy every 3 months.  Potential to decrease to every 4 months.  I will transition her hematology care to Dr. Truitt Merle at the cancer center   2.  Chronic macrocytic anemia Macrocytosis may be secondary to previous chronic alcohol use and hypothyroidism.  Normal S97 and folic acid.  Myelodysplastic syndrome not excluded.  3.  Discoid lupus  4.  CKD 3 which has improved over time and now resolved  5.  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  Hypothyroid on replacement  CC: Patient Care Team: Levin Erp, MD as PCP - General   Murriel Hopper, MD, FACP  Hematology-Oncology/Internal  Medicine     3/16/20201:40 PM

## 2019-02-13 LAB — CBC WITH DIFFERENTIAL/PLATELET
Basophils Absolute: 0 10*3/uL (ref 0.0–0.2)
Basos: 0 %
EOS (ABSOLUTE): 0.1 10*3/uL (ref 0.0–0.4)
Eos: 1 %
Hematocrit: 30 % — ABNORMAL LOW (ref 34.0–46.6)
Hemoglobin: 10.6 g/dL — ABNORMAL LOW (ref 11.1–15.9)
IMMATURE GRANULOCYTES: 0 %
Immature Grans (Abs): 0 10*3/uL (ref 0.0–0.1)
Lymphocytes Absolute: 1.4 10*3/uL (ref 0.7–3.1)
Lymphs: 30 %
MCH: 35.1 pg — ABNORMAL HIGH (ref 26.6–33.0)
MCHC: 35.3 g/dL (ref 31.5–35.7)
MCV: 99 fL — AB (ref 79–97)
Monocytes Absolute: 0.4 10*3/uL (ref 0.1–0.9)
Monocytes: 8 %
Neutrophils Absolute: 2.8 10*3/uL (ref 1.4–7.0)
Neutrophils: 61 %
Platelets: 155 10*3/uL (ref 150–450)
RBC: 3.02 x10E6/uL — ABNORMAL LOW (ref 3.77–5.28)
RDW: 12.2 % (ref 11.7–15.4)
WBC: 4.6 10*3/uL (ref 3.4–10.8)

## 2019-02-13 LAB — FERRITIN: Ferritin: 129 ng/mL (ref 15–150)

## 2019-02-13 LAB — COMPREHENSIVE METABOLIC PANEL
ALT: 24 IU/L (ref 0–32)
AST: 35 IU/L (ref 0–40)
Albumin/Globulin Ratio: 1.8 (ref 1.2–2.2)
Albumin: 4.4 g/dL (ref 3.7–4.7)
Alkaline Phosphatase: 56 IU/L (ref 39–117)
BUN/Creatinine Ratio: 10 — ABNORMAL LOW (ref 12–28)
BUN: 11 mg/dL (ref 8–27)
Bilirubin Total: 0.3 mg/dL (ref 0.0–1.2)
CO2: 20 mmol/L (ref 20–29)
Calcium: 9.9 mg/dL (ref 8.7–10.3)
Chloride: 102 mmol/L (ref 96–106)
Creatinine, Ser: 1.09 mg/dL — ABNORMAL HIGH (ref 0.57–1.00)
GFR calc Af Amer: 58 mL/min/{1.73_m2} — ABNORMAL LOW (ref >59)
GFR calc non Af Amer: 50 mL/min/{1.73_m2} — ABNORMAL LOW (ref >59)
Globulin, Total: 2.5 g/dL (ref 1.5–4.5)
Glucose: 96 mg/dL (ref 65–99)
POTASSIUM: 4.6 mmol/L (ref 3.5–5.2)
Sodium: 137 mmol/L (ref 134–144)
Total Protein: 6.9 g/dL (ref 6.0–8.5)

## 2019-02-15 ENCOUNTER — Telehealth: Payer: Self-pay | Admitting: *Deleted

## 2019-02-15 NOTE — Telephone Encounter (Signed)
Pt called / informed ": ferritin up from 71 to 129, this is still OK but we should continue every 3 month phlebotomies. Liver functions are now normal! " per Dr Cyndie Chime. Stated thanks for calling.

## 2019-02-15 NOTE — Telephone Encounter (Signed)
-----   Message from Levert Feinstein, MD sent at 02/13/2019  8:19 AM EDT ----- Call pt: ferritin up from 71 to 129, this is still OK but we should continue every 3 month phlebotomies. Liver functions are now normal!

## 2019-02-20 ENCOUNTER — Telehealth (INDEPENDENT_AMBULATORY_CARE_PROVIDER_SITE_OTHER): Payer: Self-pay | Admitting: *Deleted

## 2019-02-21 NOTE — Telephone Encounter (Signed)
I do not have any meidcal data but there is no overdose on glucosamine so should be fine.

## 2019-02-22 NOTE — Telephone Encounter (Signed)
Called pt and advised.  

## 2019-02-26 ENCOUNTER — Encounter (INDEPENDENT_AMBULATORY_CARE_PROVIDER_SITE_OTHER): Payer: Self-pay | Admitting: Physical Medicine and Rehabilitation

## 2019-02-26 NOTE — Progress Notes (Signed)
Catherine Harrington - 73 y.o. female MRN [id number]  Date of birth: 1946-03-01  Office Visit Note: Visit Date: 12/15/2018 PCP: Nila Nephew, MD Referred by: Nila Nephew, MD  Subjective: Chief Complaint  Patient presents with   Lower Back - Pain   HPI: Catherine Harrington is a 73 y.o. female who comes in today Reevaluation of chronic worsening lumbar spine pain and low back pain.  Prior injection was in August of last year and symptoms returned around Christmas.  She basically tries to put up with pain.  She does not do well with pain medication and has several intolerances to pain medicines.  Her case is complicated by history of tobacco and alcohol use.  She also has hemochromatosis as well as discoid lupus.  She reports consistent daily pain with activities that make it worse.  She has a hard time standing and doing much work.  She has a hard hard time walking at times.  Symptoms do seem to flareup from time to time if she goes through periods where it is not as bad as others.  She really is only able to use Tylenol and this seems to help a little bit.  Again a constant dull pain 6 out of 10 most of the time with worsening with walking and bending and standing.  No radicular pain down the legs no nerve pain no paresthesias no focal weakness.  We have had MRIs in the past showing scoliosis degenerative with levels of foraminal and lateral recess narrowing.  Interestingly she gets some pain on one side more than the other at times and then will always seem to be the side that looks better on the MRI.  X-rays are little bit more revealing for significant facet arthropathy and foraminal narrowing.  She is sometimes a hard historian to determine what helped and what did not help and we will lose her to follow-up at times.  Review of Systems  Constitutional: Negative for chills, fever, malaise/fatigue and weight loss.  HENT: Negative for hearing loss and sinus pain.   Eyes: Negative for blurred vision, double  vision and photophobia.  Respiratory: Negative for cough and shortness of breath.   Cardiovascular: Negative for chest pain, palpitations and leg swelling.  Gastrointestinal: Negative for abdominal pain, nausea and vomiting.  Genitourinary: Negative for flank pain.  Musculoskeletal: Positive for back pain. Negative for myalgias.  Skin: Negative for itching and rash.  Neurological: Negative for tingling, tremors, focal weakness and weakness.  Endo/Heme/Allergies: Negative.   Psychiatric/Behavioral: Negative for depression.  All other systems reviewed and are negative.  Otherwise per HPI.  Assessment & Plan: Visit Diagnoses:  1. Chronic bilateral low back pain without sciatica   2. Spondylosis without myelopathy or radiculopathy, lumbar region   3. Other idiopathic scoliosis, thoracolumbar region   4. Myofascitis     Plan: Findings:  Chronic pain syndrome and chronic low back pain with scoliosis and lateral recess and foraminal narrowing and facet arthropathy degenerative changes.  This is in the setting of really chronic significant disease including hemochromatosis needing phlebotomy as well as history of some liver disease as well as history of some alcohol and tobacco use.  She is a very thin individual and just goes through periods of time but just chronic low back pain that is unremitting.  She is very anxious at times and really does not do well with injections and needs oral sedation.  Tends to be somewhat of a poor historian as well her husband is present  today and does give some of the history.  She carries a allergy to prednisone but does well with cortisone shots without any issue at all.  My guess is the prednisone even though it is reported as anaphylaxis in the chart was probably more of an overall panic attack is my guess.  We have done cortisone injections without any difficulty at all.  At this point I think diagnostic medial branch blocks without any cortisone and just do this  from a purely diagnostic standpoint of the lower 3 levels would be beneficial.  We will do this with a pain diary to get some idea if it really helps.  If it does help we would look at a double block paradigm and radiofrequency ablation to see if this gives her some prolonged relief.    Meds & Orders: No orders of the defined types were placed in this encounter.  No orders of the defined types were placed in this encounter.   Follow-up: Return for Bilateral L3-4, L4-L5-S1 medial branch facet blocks with oral sedation..   Procedures: No procedures performed  No notes on file   Clinical History: MRI LUMBAR SPINE WITHOUT CONTRAST 05/09/2017  TECHNIQUE: Multiplanar, multisequence MR imaging of the lumbar spine was performed. No intravenous contrast was administered.  COMPARISON:  Lumbar spine MRI 06/11/2014  FINDINGS: Segmentation:  Normal  Alignment:  There is right convex lumbar scoliosis with apex at L3.  Vertebrae: Bone marrow signal is heterogeneous, unchanged. No evidence of discitis osteomyelitis. No acute compression fracture.  Conus medullaris: Extends to the L1 level and appears normal.  Paraspinal and other soft tissues: There is fatty atrophy of the posterior paraspinous muscles, slightly worse on the left.  Disc levels:  T9-T12: These levels are assessed on sagittal sequences only. No disc herniation, spinal canal stenosis or neural foraminal narrowing.  T12-L1: Normal disc space and facets. No spinal canal or neuroforaminal stenosis.  L1-L2: Severe disc space narrowing with minimal bulge. Moderate right and mild left neural foraminal narrowing, primarily caused by scoliotic curvature.  L2-L3: Severe disc space loss with endplate osteophytosis. No spinal canal stenosis. Moderate right and severe left neural foraminal stenosis, unchanged. Left-greater-than-right facet arthrosis.  L3-L4: Severe disc space narrowing. No spinal canal stenosis.  There is unchanged severe left foraminal stenosis. Right neural foramen is widely patent.  L4-L5: Disc space narrowing with central disc protrusion, unchanged. Severe bilateral facet hypertrophy, also unchanged. No central spinal canal stenosis. Severe right and moderate left neural foraminal stenosis, unchanged.  L5-S1: Moderate bilateral facet hypertrophy but no stenosis.  Visualized sacrum: Normal.  IMPRESSION: 1. Unchanged appearance of marked lumbar dextroscoliosis, which, in combination degenerative disc disease and facet arthrosis, that leads to severe neural foraminal narrowing at multiple levels, including L2-3, L3-4 and L4-5. These findings are unchanged, however, compared to the MRI of 06/11/2014. 2. No central spinal canal stenosis of the lumbar spine. 3. Multilevel moderate to severe facet arthrosis, which may serve as a source of localized back pain.  Lspine MRI 06/11/14 There is moderate lumbar dextroscoliosis with apex at L3. There is left lateral listhesis of L2 on L3 and right lateral listhesis of L3 on L4 and L4 on L5. Bone marrow signal is somewhat heterogeneous diffusely and overall decreased in signal in the lower thoracic spine (possibly reflecting patient's history of anemia) with prominent fatty degenerative marrow changes in the mid and lower lumbar spine. Modic type 1 degenerative marrow changes are also noted in this region. There is lateral recess narrowing  at L2-3 on the right.   She reports that she has been smoking cigarettes. She has a 48.00 pack-year smoking history. She has never used smokeless tobacco. No results for input(s): HGBA1C, LABURIC in the last 8760 hours.  Objective:  VS:  HT:5\' 2"  (157.5 cm)    WT:102 lb (46.3 kg)   BMI:18.65     BP:(!) 143/69   HR:79bpm   TEMP: ( )   RESP:  Physical Exam Vitals signs and nursing note reviewed.  Constitutional:      General: She is not in acute distress.    Appearance: Normal appearance. She  is well-developed. She is ill-appearing.     Comments: Very thin  HENT:     Head: Normocephalic and atraumatic.     Nose: Nose normal.     Mouth/Throat:     Mouth: Mucous membranes are moist.     Pharynx: Oropharynx is clear.  Eyes:     Conjunctiva/sclera: Conjunctivae normal.     Pupils: Pupils are equal, round, and reactive to light.  Neck:     Musculoskeletal: Normal range of motion and neck supple.  Cardiovascular:     Rate and Rhythm: Regular rhythm.  Pulmonary:     Effort: Pulmonary effort is normal. No respiratory distress.  Abdominal:     General: There is no distension.     Palpations: Abdomen is soft.     Tenderness: There is no guarding.  Musculoskeletal:     Right lower leg: No edema.     Left lower leg: No edema.     Comments: Patient ambulates without aid but has a forward flexed lumbar spine with obvious scoliosis curvature.  She has no pain over the greater trochanters but she is really exquisitely tender even to light palpation along the paraspinal musculature and the PSIS bilaterally.  She has a negative slump test bilaterally no pain with hip rotation.  She has good distal strength.  No clonus.  Skin:    General: Skin is warm and dry.     Findings: No erythema or rash.  Neurological:     General: No focal deficit present.     Mental Status: She is alert and oriented to person, place, and time.     Motor: No abnormal muscle tone.     Coordination: Coordination normal.     Gait: Gait normal.  Psychiatric:        Mood and Affect: Mood normal.        Behavior: Behavior normal.        Thought Content: Thought content normal.     Ortho Exam Imaging: No results found.  Past Medical/Family/Surgical/Social History: Medications & Allergies reviewed per EMR, new medications updated. 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   Macrocytosis 08/16/2013   Syncope 08/16/2013   DIVERTICULOSIS, COLON 11/04/2010   Past Medical History:  Diagnosis Date   Acute right flank pain 06/29/2016   Arthritis    "related to the lupus; joints" (08/16/2013)   Chronic renal insufficiency, stage III (moderate) (HCC) 06/29/2016   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   Family history of anesthesia complication    "daughter doesn't wake up easy from it" (08/16/2013)  Fibrosis of liver 04/18/2018   Hemochromatosis 05/18/2016   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   Hypertension    Iron deficiency anemia    Lumbar disc disease    Lupus (HCC)    "just the rash and arthritis" (08/16/2013)   Macrocytic anemia 06/29/2016   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/18/2016   Scoliosis    Syncope and collapse    "lost consciousness ~ 4-5 seconds; didn't hurt myself" (08/16/2013)   Family History  Problem Relation Age of Onset   Hypertension Mother    Hypertension Father    Past Surgical History:  Procedure Laterality Date   CATARACT EXTRACTION Bilateral 10/19 11/19   CESAREAN SECTION  1967; 1971   MOUTH SURGERY  2013   " infection under bridge cleaned out, etc" (08/16/2013)   TUBAL LIGATION     Social History   Occupational History   Occupation: retired  Tobacco Use   Smoking status: Current Every Day Smoker    Packs/day: 1.00    Years: 48.00    Pack years: 48.00    Types: Cigarettes   Smokeless tobacco: Never Used  Substance and Sexual Activity   Alcohol use: Yes    Alcohol/week: 21.0 standard drinks    Types: 21 Cans of beer per week    Comment:  3-4 drinks daily sometimes.   Drug use: No   Sexual activity: Not Currently    Partners: Male    Birth control/protection: Post-menopausal

## 2019-03-05 ENCOUNTER — Encounter (INDEPENDENT_AMBULATORY_CARE_PROVIDER_SITE_OTHER): Payer: Self-pay | Admitting: Physical Medicine and Rehabilitation

## 2019-03-05 ENCOUNTER — Encounter: Payer: Self-pay | Admitting: Hematology

## 2019-03-05 ENCOUNTER — Telehealth: Payer: Self-pay | Admitting: Hematology

## 2019-03-05 NOTE — Telephone Encounter (Signed)
Pt has been rescheduled to see Dr. Mosetta Putt to 5/20 at 2pm with labs at 1:30pm. Ms. Catherine Harrington has been cld and agreed to the new appt date and time. New letter mailed.

## 2019-03-08 ENCOUNTER — Encounter (INDEPENDENT_AMBULATORY_CARE_PROVIDER_SITE_OTHER): Payer: Self-pay | Admitting: Physical Medicine and Rehabilitation

## 2019-03-15 ENCOUNTER — Other Ambulatory Visit: Payer: Medicare Other

## 2019-03-15 ENCOUNTER — Encounter: Payer: Medicare Other | Admitting: Hematology

## 2019-03-21 ENCOUNTER — Encounter (INDEPENDENT_AMBULATORY_CARE_PROVIDER_SITE_OTHER): Payer: Medicare Other | Admitting: Physical Medicine and Rehabilitation

## 2019-03-28 ENCOUNTER — Encounter (INDEPENDENT_AMBULATORY_CARE_PROVIDER_SITE_OTHER): Payer: Medicare Other | Admitting: Physical Medicine and Rehabilitation

## 2019-04-02 ENCOUNTER — Encounter (INDEPENDENT_AMBULATORY_CARE_PROVIDER_SITE_OTHER): Payer: Self-pay | Admitting: Physical Medicine and Rehabilitation

## 2019-04-12 ENCOUNTER — Other Ambulatory Visit: Payer: Self-pay

## 2019-04-12 ENCOUNTER — Ambulatory Visit: Payer: Self-pay

## 2019-04-12 ENCOUNTER — Encounter: Payer: Self-pay | Admitting: Physical Medicine and Rehabilitation

## 2019-04-12 ENCOUNTER — Ambulatory Visit (INDEPENDENT_AMBULATORY_CARE_PROVIDER_SITE_OTHER): Payer: Medicare Other | Admitting: Physical Medicine and Rehabilitation

## 2019-04-12 VITALS — BP 115/64 | HR 74

## 2019-04-12 DIAGNOSIS — M419 Scoliosis, unspecified: Secondary | ICD-10-CM

## 2019-04-12 DIAGNOSIS — M47816 Spondylosis without myelopathy or radiculopathy, lumbar region: Secondary | ICD-10-CM | POA: Diagnosis not present

## 2019-04-12 DIAGNOSIS — M545 Low back pain, unspecified: Secondary | ICD-10-CM

## 2019-04-12 DIAGNOSIS — G8929 Other chronic pain: Secondary | ICD-10-CM

## 2019-04-12 MED ORDER — METHYLPREDNISOLONE ACETATE 80 MG/ML IJ SUSP
80.0000 mg | Freq: Once | INTRAMUSCULAR | Status: AC
  Administered 2019-04-12: 80 mg

## 2019-04-12 NOTE — Progress Notes (Signed)
 .  Numeric Pain Rating Scale and Functional Assessment Average Pain 7   In the last MONTH (on 0-10 scale) has pain interfered with the following?  1. General activity like being  able to carry out your everyday physical activities such as walking, climbing stairs, carrying groceries, or moving a chair?  Rating(5)   +Driver, -BT, -Dye Allergies.  

## 2019-04-16 ENCOUNTER — Telehealth: Payer: Self-pay | Admitting: Physical Medicine and Rehabilitation

## 2019-04-17 NOTE — Telephone Encounter (Signed)
Called patient to advise. She does not have any muscle relaxers, and she will call us back if she wants to try one.

## 2019-04-17 NOTE — Telephone Encounter (Signed)
She should use ICE/Heat, muscle relaxer if available. The procedure is not very destructive and went extremely well. She should come to appointment so we can talk even if not doing Procedure. We may need to refer to [facility] [place] or [facility] with her curvature etc.

## 2019-04-18 ENCOUNTER — Inpatient Hospital Stay: Payer: Medicare Other

## 2019-04-18 ENCOUNTER — Inpatient Hospital Stay: Payer: Medicare Other | Admitting: Hematology

## 2019-04-19 ENCOUNTER — Encounter: Payer: Self-pay | Admitting: Physical Medicine and Rehabilitation

## 2019-04-19 ENCOUNTER — Other Ambulatory Visit: Payer: Self-pay

## 2019-04-19 ENCOUNTER — Ambulatory Visit (INDEPENDENT_AMBULATORY_CARE_PROVIDER_SITE_OTHER): Payer: Medicare Other | Admitting: Physical Medicine and Rehabilitation

## 2019-04-19 ENCOUNTER — Telehealth: Payer: Self-pay | Admitting: Hematology

## 2019-04-19 ENCOUNTER — Ambulatory Visit: Payer: Self-pay

## 2019-04-19 VITALS — BP 149/77 | HR 86

## 2019-04-19 DIAGNOSIS — G894 Chronic pain syndrome: Secondary | ICD-10-CM

## 2019-04-19 DIAGNOSIS — M419 Scoliosis, unspecified: Secondary | ICD-10-CM

## 2019-04-19 DIAGNOSIS — M47816 Spondylosis without myelopathy or radiculopathy, lumbar region: Secondary | ICD-10-CM | POA: Diagnosis not present

## 2019-04-19 DIAGNOSIS — R1084 Generalized abdominal pain: Secondary | ICD-10-CM

## 2019-04-19 DIAGNOSIS — G8929 Other chronic pain: Secondary | ICD-10-CM

## 2019-04-19 DIAGNOSIS — M545 Low back pain, unspecified: Secondary | ICD-10-CM

## 2019-04-19 MED ORDER — METHYLPREDNISOLONE ACETATE 80 MG/ML IJ SUSP
80.0000 mg | Freq: Once | INTRAMUSCULAR | Status: AC
  Administered 2019-04-19: 80 mg

## 2019-04-19 MED ORDER — BACLOFEN 10 MG PO TABS: ORAL_TABLET | ORAL | 0 refills | Status: DC

## 2019-04-19 NOTE — Telephone Encounter (Signed)
Pt has been cld and rescheduled to see Dr. Mosetta Putt on 6/25 at 1pm w/labs at 1230pm.

## 2019-04-19 NOTE — Progress Notes (Signed)
   Numeric Pain Rating Scale and Functional Assessment Average Pain 9   In the last MONTH (on 0-10 scale) has pain interfered with the following?  1. General activity like being  able to carry out your everyday physical activities such as walking, climbing stairs, carrying groceries, or moving a chair?  Rating(9)  Right side lower back/buttocks muscle spasms with some numbness. Off/On.  TENS unit has helped.  +Driver, -BT, -Dye Allergies.

## 2019-04-19 NOTE — Procedures (Signed)
Lumbar Facet Joint Nerve Denervation  Patient: Catherine Harrington      Date of Birth: 04-Jul-1946 MRN: [id number] PCP: Nila Nephew, MD      Visit Date: 04/12/2019   Universal Protocol:    Date/Time: 05/21/201:11 PM  Consent Given By: the patient  Position: PRONE  Additional Comments: Vital signs were monitored before and after the procedure. Patient was prepped and draped in the usual sterile fashion. The correct patient, procedure, and site was verified.   Injection Procedure Details:  Procedure Site One Meds Administered:  Meds ordered this encounter  Medications  . methylPREDNISolone acetate (DEPO-MEDROL) injection 80 mg     Laterality: Right  Location/Site:  L3-L4 L4-L5 L5-S1  Needle size: 18 G  Needle type: Radiofrequency cannula  Needle Placement: Along juncture of superior articular process and transverse pocess  Findings:  -Comments:  Procedure Details: For each desired target nerve, the corresponding transverse process (sacral ala for the L5 dorsal rami) was identified and the fluoroscope was positioned to square off the endplates of the corresponding vertebral body to achieve a true AP midline view.  The beam was then obliqued 15 to 20 degrees and caudally tilted 15 to 20 degrees to line up a trajectory along the target nerves. The skin over the target of the junction of superior articulating process and transverse process (sacral ala for the L5 dorsal rami) was infiltrated with 85ml of 1% Lidocaine without Epinephrine.  The 18 gauge 72mm active tip outer cannula was advanced in trajectory view to the target.  This procedure was repeated for each target nerve.  Then, for all levels, the outer cannula placement was fine-tuned and the position was then confirmed with bi-planar imaging.    Test stimulation was done both at sensory and motor levels to ensure there was no radicular stimulation. The target tissues were then infiltrated with 1 ml of 1% Lidocaine without  Epinephrine. Subsequently, a percutaneous neurotomy was carried out for 90 seconds at 80 degrees Celsius.  After the completion of the lesion, 1 ml of injectate was delivered. It was then repeated for each facet joint nerve mentioned above. Appropriate radiographs were obtained to verify the probe placement during the neurotomy.   Additional Comments:  The patient tolerated the procedure well Dressing: 2 x 2 sterile gauze and Band-Aid    Post-procedure details: Patient was observed during the procedure. Post-procedure instructions were reviewed.  Patient left the clinic in stable condition.

## 2019-04-19 NOTE — Progress Notes (Signed)
Catherine Harrington - 73 y.o. female MRN [id number]  Date of birth: [birth year]  Office Visit Note: Visit Date: 04/12/2019 PCP: Nila Nephew, MD Referred by: Nila Nephew, MD  Subjective: Chief Complaint  Patient presents with  . Lower Back - Pain   HPI: Catherine Harrington is a 73 y.o. female who comes in today For planned right L3-4, L4-5 and L5-S1 facet joint radiofrequency ablation.  The patient suffers from chronic worsening severe right more than left bilateral low back pain some referral in the right posterior lateral hip but no radicular complaints down the leg.  She continues to have a lot of spasming at times.  Her Hilts really just is not great she continues to suffer from hemochromatosis and has had to restart getting therapeutic phlebotomy.  She has had multiple bouts of physical therapy in the past she has had medication management.  She continues with a small amount of gabapentin at night.  She does not really tolerate pain medications.  She does not really like taking medication at all.  She has pretty significant degenerative scoliosis of the thoracolumbar spine.  Activity level is diminished.  She has had double diagnostic facet joint blocks with more than 80% relief of back pain.  We are going to complete the ablation today.  Please see other notes for further justification details.  As a side note she has mentioned on various occasions that she has had a history of hernias which she points to abdominal hernias and she feels like these are active and and hurting.  She wants a referral to Dr. Carman Ching for evaluation and will try to make that happen.  I have not really evaluated her for any hernias.  Patient was given 0.25 mg of triazolam today for oral sedation and this seemed to work fairly well.  She is fairly sedated today and this actually is beneficial for her she just really has a lot of what I would refer to is just extra sensory pain sensations to any sort of light touch or  palpation along the skin musculature.  ROS Otherwise per HPI.  Assessment & Plan: Visit Diagnoses:  1. Spondylosis without myelopathy or radiculopathy, lumbar region   2. Chronic bilateral low back pain without sciatica   3. Scoliosis of thoracolumbar spine, unspecified scoliosis type     Plan: No additional findings.   Meds & Orders:  Meds ordered this encounter  Medications  . methylPREDNISolone acetate (DEPO-MEDROL) injection 80 mg    Orders Placed This Encounter  Procedures  . Radiofrequency,Lumbar  . XR C-ARM NO REPORT    Follow-up: Return if symptoms worsen or fail to improve.   Procedures: No procedures performed  Lumbar Facet Joint Nerve Denervation  Patient: Catherine Harrington      Date of Birth: 08-08-1946 MRN: [id number] PCP: Nila Nephew, MD      Visit Date: 04/12/2019   Universal Protocol:    Date/Time: 05/21/201:11 PM  Consent Given By: the patient  Position: PRONE  Additional Comments: Vital signs were monitored before and after the procedure. Patient was prepped and draped in the usual sterile fashion. The correct patient, procedure, and site was verified.   Injection Procedure Details:  Procedure Site One Meds Administered:  Meds ordered this encounter  Medications  . methylPREDNISolone acetate (DEPO-MEDROL) injection 80 mg     Laterality: Right  Location/Site:  L3-L4 L4-L5 L5-S1  Needle size: 18 G  Needle type: Radiofrequency cannula  Needle Placement: Along juncture of superior articular process and  transverse pocess  Findings:  -Comments:  Procedure Details: For each desired target nerve, the corresponding transverse process (sacral ala for the L5 dorsal rami) was identified and the fluoroscope was positioned to square off the endplates of the corresponding vertebral body to achieve a true AP midline view.  The beam was then obliqued 15 to 20 degrees and caudally tilted 15 to 20 degrees to line up a trajectory along the target  nerves. The skin over the target of the junction of superior articulating process and transverse process (sacral ala for the L5 dorsal rami) was infiltrated with 1ml of 1% Lidocaine without Epinephrine.  The 18 gauge 10mm active tip outer cannula was advanced in trajectory view to the target.  This procedure was repeated for each target nerve.  Then, for all levels, the outer cannula placement was fine-tuned and the position was then confirmed with bi-planar imaging.    Test stimulation was done both at sensory and motor levels to ensure there was no radicular stimulation. The target tissues were then infiltrated with 1 ml of 1% Lidocaine without Epinephrine. Subsequently, a percutaneous neurotomy was carried out for 90 seconds at 80 degrees Celsius.  After the completion of the lesion, 1 ml of injectate was delivered. It was then repeated for each facet joint nerve mentioned above. Appropriate radiographs were obtained to verify the probe placement during the neurotomy.   Additional Comments:  The patient tolerated the procedure well Dressing: 2 x 2 sterile gauze and Band-Aid    Post-procedure details: Patient was observed during the procedure. Post-procedure instructions were reviewed.  Patient left the clinic in stable condition.      Clinical History: MRI LUMBAR SPINE WITHOUT CONTRAST 05/09/2017  TECHNIQUE: Multiplanar, multisequence MR imaging of the lumbar spine was performed. No intravenous contrast was administered.  COMPARISON:  Lumbar spine MRI 06/11/2014  FINDINGS: Segmentation:  Normal  Alignment:  There is right convex lumbar scoliosis with apex at L3.  Vertebrae: Bone marrow signal is heterogeneous, unchanged. No evidence of discitis osteomyelitis. No acute compression fracture.  Conus medullaris: Extends to the L1 level and appears normal.  Paraspinal and other soft tissues: There is fatty atrophy of the posterior paraspinous muscles, slightly worse on  the left.  Disc levels:  T9-T12: These levels are assessed on sagittal sequences only. No disc herniation, spinal canal stenosis or neural foraminal narrowing.  T12-L1: Normal disc space and facets. No spinal canal or neuroforaminal stenosis.  L1-L2: Severe disc space narrowing with minimal bulge. Moderate right and mild left neural foraminal narrowing, primarily caused by scoliotic curvature.  L2-L3: Severe disc space loss with endplate osteophytosis. No spinal canal stenosis. Moderate right and severe left neural foraminal stenosis, unchanged. Left-greater-than-right facet arthrosis.  L3-L4: Severe disc space narrowing. No spinal canal stenosis. There is unchanged severe left foraminal stenosis. Right neural foramen is widely patent.  L4-L5: Disc space narrowing with central disc protrusion, unchanged. Severe bilateral facet hypertrophy, also unchanged. No central spinal canal stenosis. Severe right and moderate left neural foraminal stenosis, unchanged.  L5-S1: Moderate bilateral facet hypertrophy but no stenosis.  Visualized sacrum: Normal.  IMPRESSION: 1. Unchanged appearance of marked lumbar dextroscoliosis, which, in combination degenerative disc disease and facet arthrosis, that leads to severe neural foraminal narrowing at multiple levels, including L2-3, L3-4 and L4-5. These findings are unchanged, however, compared to the MRI of 06/11/2014. 2. No central spinal canal stenosis of the lumbar spine. 3. Multilevel moderate to severe facet arthrosis, which may serve as a source of  localized back pain.  Lspine MRI 06/11/14 There is moderate lumbar dextroscoliosis with apex at L3. There is left lateral listhesis of L2 on L3 and right lateral listhesis of L3 on L4 and L4 on L5. Bone marrow signal is somewhat heterogeneous diffusely and overall decreased in signal in the lower thoracic spine (possibly reflecting patient's history of anemia) with prominent  fatty degenerative marrow changes in the mid and lower lumbar spine. Modic type 1 degenerative marrow changes are also noted in this region. There is lateral recess narrowing at L2-3 on the right.   She reports that she has been smoking cigarettes. She has a 48.00 pack-year smoking history. She has never used smokeless tobacco. No results for input(s): HGBA1C, LABURIC in the last 8760 hours.  Objective:  VS:  HT:    WT:   BMI:     BP:115/64  HR:74bpm  TEMP: ( )  RESP:  Physical Exam  Ortho Exam Imaging: Xr C-arm No Report  Result Date: 04/19/2019 Please see Notes tab for imaging impression.   Past Medical/Family/Surgical/Social History: Medications & Allergies reviewed per EMR, new medications updated. Patient Active Problem List   Diagnosis Date Noted  . Fibrosis of liver 04/18/2018  . Macrocytic anemia 06/29/2016  . Chronic renal insufficiency, stage III (moderate) (HCC) 06/29/2016  . Acute right flank pain 06/29/2016  . Chronic renal insufficiency 06/29/2016  . Hemochromatosis 05/18/2016  . 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 Macrocytosis 08/16/2013  . Syncope 08/16/2013  . DIVERTICULOSIS, COLON 11/04/2010   Past Medical History:  Diagnosis Date  . Acute right flank pain 06/29/2016  . Arthritis    "related to the lupus; joints" (08/16/2013)  . Chronic renal insufficiency, stage III (moderate) (HCC) 06/29/2016  . 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 Family history of anesthesia complication    "daughter doesn't wake up easy from it" (08/16/2013)  . Fibrosis of liver 04/18/2018  . 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 Hypertension   . Iron deficiency anemia   . Lumbar disc disease   . Lupus (HCC)    "just the rash and arthritis" (08/16/2013)  . Macrocytic anemia 06/29/2016  . 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 Scoliosis   . Syncope and collapse    "lost consciousness ~ 4-5  seconds; didn't hurt myself" (08/16/2013)   Family History  Problem Relation Age of Onset  . Hypertension Mother   . Hypertension Father    Past Surgical History:  Procedure Laterality Date  . CATARACT EXTRACTION Bilateral 10/19 11/19  . CESAREAN SECTION  1967; 1971  . MOUTH SURGERY  2013   " infection under bridge cleaned out, etc" (08/16/2013)  . TUBAL LIGATION     Social History   Occupational History  . Occupation: retired  Tobacco Use  . Smoking status: Current Every Day Smoker    Packs/day: 1.00    Years: 48.00    Pack years: 48.00    Types: Cigarettes  . Smokeless tobacco: Never Used  Substance and Sexual Activity  . Alcohol use: Yes    Alcohol/week: 21.0 standard drinks    Types: 21 Cans of beer per week    Comment:  3-4 drinks daily sometimes.  . Drug use: No  . Sexual activity: Not Currently    Partners: Male    Birth control/protection: Post-menopausal

## 2019-04-20 ENCOUNTER — Encounter: Payer: Self-pay | Admitting: Physical Medicine and Rehabilitation

## 2019-04-20 NOTE — Progress Notes (Signed)
Catherine Harrington - 73 y.o. female MRN [id number]  Date of birth: June 21, 1946  Office Visit Note: Visit Date: 04/19/2019 PCP: Nila Nephew, MD Referred by: Nila Nephew, MD  Subjective: Chief Complaint  Patient presents with   Lower Back - Pain   Right Hip - Pain   Abdominal Pain   HPI: Catherine Harrington is a 73 y.o. female who comes in today For planned left radiofrequency ablation of the L3-4 L4-5 L5-S1 facet joints.  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  Spine is very degenerative scoliosis without high-grade central stenosis.  Pain complaints sometimes are very somatic even with very sensitive skin and even to very light palpation.  She has been to physical therapy multiple times.  She does not tolerate medications well at all and really does not like to take any medications.  Her husband who is here today as well comments that it is really hard to get her to take anything.  She does take gabapentin once at night and we really had convince her to do that.  She has had the right side radiofrequency completion and seems to be doing okay from that other than she has this buttock pain that is new.  She gets a lot of cramping as well.  Unsure about total nutrition status patient is 103 pounds.  We are going to complete left sided ablation.  I will look for trigger point on the right which is been there before.  Otherwise I am not really sure where this is headed in terms of an overall chronic pain situation is very difficult with really limited treatment avenues.  She also comments today again that she would like a referral to someone to evaluate for history of hernias that she has had in the past and she feels like she is getting hernias that will pop in and out at this point.  I have not witnessed this in the office and have not really seen evidence of this.  She says that she has been treated in the past.  She reports worsening with movement or trying  to get in and out of a chair or bed that she feels this happening.  I did review the records on epic and she has been treated in the past for severe diverticulosis but I did not see any history of treatment for hernias.  Review of Systems  Constitutional: Negative for weight loss.  HENT: Negative.   Eyes: Negative.   Respiratory: Positive for cough.   Cardiovascular: Negative.   Gastrointestinal: Positive for abdominal pain.  Genitourinary: Negative for dysuria and frequency.  Musculoskeletal: Positive for back pain and joint pain.  Skin: Negative.   Neurological: Negative for tingling and focal weakness.  Endo/Heme/Allergies: Negative.   Psychiatric/Behavioral: The patient is nervous/anxious.    Otherwise per HPI.  Assessment & Plan: Visit Diagnoses:  1. Spondylosis without myelopathy or radiculopathy, lumbar region   2. Chronic bilateral low back pain without sciatica   3. Generalized abdominal pain   4. Chronic pain syndrome   5. Scoliosis of thoracolumbar spine, unspecified scoliosis type     Plan: Findings:  For her chronic back pain we are going to finish the ablation on the left side.  At that point depending on how she is doing we could at least rule out facet arthropathy as some of the source of her back pain.  The right buttock pain that is really bugging her the last few days I do think  is myofascial with trigger point.  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  She might ultimately be a candidate for pain psychology although I doubt she would go along with may be more comprehensive pain management.  We will continue to try different medications.  For her spasming Catherine Harrington try baclofen if she will actually take it we discussed this with her at length.  We have encouraged exercise and movement.  Surgical approach to her spine I think would be very problematic they really do not want to do this with her  health conditions and it would be a pretty significant back surgery if it would help at all she has really pretty significant scoliosis.  She in terms of the situation where she describes a history of hernias in the past and multiple hernias with treatment I will send a consultation request of Dr. Abigail Miyamoto in general surgery just to see if he can see her.  I am really not sure what is going on there and have not really identified specific hernia.    Meds & Orders:  Meds ordered this encounter  Medications   methylPREDNISolone acetate (DEPO-MEDROL) injection 80 mg   baclofen (LIORESAL) 10 MG tablet    Sig: Take 1/2 to 1 by mouth every 8hrs as needed for spasm    Dispense:  60 tablet    Refill:  0    Orders Placed This Encounter  Procedures   Radiofrequency,Lumbar   XR C-ARM NO REPORT   Ambulatory referral to General Surgery    Follow-up: Return if symptoms worsen or fail to improve.   Procedures: No procedures performed  Lumbar Facet Joint Nerve Denervation  Patient: Catherine Harrington      Date of Birth: 1946-01-09 MRN: [id number] PCP: Nila Nephew, MD      Visit Date: 04/19/2019   Universal Protocol:    Date/Time: 05/22/206:15 AM  Consent Given By: the patient  Position: PRONE  Additional Comments: Vital signs were monitored before and after the procedure. Patient was prepped and draped in the usual sterile fashion. The correct patient, procedure, and site was verified.   Injection Procedure Details:  Procedure Site One Meds Administered:  Meds ordered this encounter  Medications   methylPREDNISolone acetate (DEPO-MEDROL) injection 80 mg   baclofen (LIORESAL) 10 MG tablet    Sig: Take 1/2 to 1 by mouth every 8hrs as needed for spasm    Dispense:  60 tablet    Refill:  0     Laterality: Left  Location/Site:  L3-L4 L4-L5 L5-S1  Needle size: 18 G  Needle type: Radiofrequency cannula  Needle Placement: Along juncture of superior articular  process and transverse pocess  Findings:  -Comments:  Procedure Details: For each desired target nerve, the corresponding transverse process (sacral ala for the L5 dorsal rami) was identified and the fluoroscope was positioned to square off the endplates of the corresponding vertebral body to achieve a true AP midline view.  The beam was then obliqued 15 to 20 degrees and caudally tilted 15 to 20 degrees to line up a trajectory along the target nerves. The skin over the target of the junction of superior articulating process and transverse process (sacral ala for the L5 dorsal rami) was infiltrated with 1ml of 1% Lidocaine without Epinephrine.  The 18 gauge 10mm active tip outer cannula was advanced in trajectory view to the target.  This procedure was repeated for  each target nerve.  Then, for all levels, the outer cannula placement was fine-tuned and the position was then confirmed with bi-planar imaging.    Test stimulation was done both at sensory and motor levels to ensure there was no radicular stimulation. The target tissues were then infiltrated with 1 ml of 1% Lidocaine without Epinephrine. Subsequently, a percutaneous neurotomy was carried out for 90 seconds at 80 degrees Celsius.  After the completion of the lesion, 1 ml of injectate was delivered. It was then repeated for each facet joint nerve mentioned above. Appropriate radiographs were obtained to verify the probe placement during the neurotomy.   Additional Comments:  The patient tolerated the procedure well Dressing: 2 x 2 sterile gauze and Band-Aid    Post-procedure details: Patient was observed during the procedure. Post-procedure instructions were reviewed.  Patient left the clinic in stable condition.      Clinical History: MRI LUMBAR SPINE WITHOUT CONTRAST 05/09/2017  TECHNIQUE: Multiplanar, multisequence MR imaging of the lumbar spine was performed. No intravenous contrast was administered.  COMPARISON:   Lumbar spine MRI 06/11/2014  FINDINGS: Segmentation:  Normal  Alignment:  There is right convex lumbar scoliosis with apex at L3.  Vertebrae: Bone marrow signal is heterogeneous, unchanged. No evidence of discitis osteomyelitis. No acute compression fracture.  Conus medullaris: Extends to the L1 level and appears normal.  Paraspinal and other soft tissues: There is fatty atrophy of the posterior paraspinous muscles, slightly worse on the left.  Disc levels:  T9-T12: These levels are assessed on sagittal sequences only. No disc herniation, spinal canal stenosis or neural foraminal narrowing.  T12-L1: Normal disc space and facets. No spinal canal or neuroforaminal stenosis.  L1-L2: Severe disc space narrowing with minimal bulge. Moderate right and mild left neural foraminal narrowing, primarily caused by scoliotic curvature.  L2-L3: Severe disc space loss with endplate osteophytosis. No spinal canal stenosis. Moderate right and severe left neural foraminal stenosis, unchanged. Left-greater-than-right facet arthrosis.  L3-L4: Severe disc space narrowing. No spinal canal stenosis. There is unchanged severe left foraminal stenosis. Right neural foramen is widely patent.  L4-L5: Disc space narrowing with central disc protrusion, unchanged. Severe bilateral facet hypertrophy, also unchanged. No central spinal canal stenosis. Severe right and moderate left neural foraminal stenosis, unchanged.  L5-S1: Moderate bilateral facet hypertrophy but no stenosis.  Visualized sacrum: Normal.  IMPRESSION: 1. Unchanged appearance of marked lumbar dextroscoliosis, which, in combination degenerative disc disease and facet arthrosis, that leads to severe neural foraminal narrowing at multiple levels, including L2-3, L3-4 and L4-5. These findings are unchanged, however, compared to the MRI of 06/11/2014. 2. No central spinal canal stenosis of the lumbar spine. 3. Multilevel  moderate to severe facet arthrosis, which may serve as a source of localized back pain.  Lspine MRI 06/11/14 There is moderate lumbar dextroscoliosis with apex at L3. There is left lateral listhesis of L2 on L3 and right lateral listhesis of L3 on L4 and L4 on L5. Bone marrow signal is somewhat heterogeneous diffusely and overall decreased in signal in the lower thoracic spine (possibly reflecting patient's history of anemia) with prominent fatty degenerative marrow changes in the mid and lower lumbar spine. Modic type 1 degenerative marrow changes are also noted in this region. There is lateral recess narrowing at L2-3 on the right.   She reports that she has been smoking cigarettes. She has a 48.00 pack-year smoking history. She has never used smokeless tobacco. No results for input(s): HGBA1C, LABURIC in the last 8760 hours.  Objective:  VS:  HT:     WT:    BMI:      BP:(!) 149/77   HR:86bpm   TEMP: ( )   RESP:99 % Physical Exam Vitals signs and nursing note reviewed.  Constitutional:      General: She is not in acute distress.    Appearance: Normal appearance. She is not ill-appearing.     Comments: Patient very thin with ruddy wearing skin.  HENT:     Head: Normocephalic and atraumatic.     Mouth/Throat:     Mouth: Mucous membranes are moist.     Pharynx: Oropharynx is clear.  Eyes:     Extraocular Movements: Extraocular movements intact.     Conjunctiva/sclera: Conjunctivae normal.     Pupils: Pupils are equal, round, and reactive to light.  Cardiovascular:     Rate and Rhythm: Normal rate and regular rhythm.     Pulses: Normal pulses.  Pulmonary:     Effort: Pulmonary effort is normal.     Comments: Chronic productive cough Abdominal:     General: Abdomen is flat. There is no distension. There are no signs of injury.     Palpations: Abdomen is soft. There is no mass.     Tenderness: There is no abdominal tenderness. There is no guarding or rebound.     Hernia: No  hernia is present.  Musculoskeletal:     Right lower leg: No edema.     Left lower leg: No edema.     Comments: Patient is very slow to rise from seated position but does ambulate slowly without aid.  We did have mild sedation on board today for procedure.  She has no swelling in the lower extremities.  She has good distal strength without foot drop.  No pain with hip rotation.  Obvious scoliotic deformity of the thoracolumbar spine.  Tender to even light palpation across the lower spine with focal trigger point in the right gluteus medius area.  Skin:    General: Skin is warm and dry.     Findings: No erythema or rash.  Neurological:     General: No focal deficit present.     Mental Status: She is alert and oriented to person, place, and time.     Sensory: No sensory deficit.     Motor: No abnormal muscle tone.     Coordination: Coordination normal.     Gait: Gait normal.  Psychiatric:        Mood and Affect: Mood normal.        Behavior: Behavior normal.     Ortho Exam Imaging: Xr C-arm No Report  Result Date: 04/19/2019 Please see Notes tab for imaging impression.   Past Medical/Family/Surgical/Social History: Medications & Allergies reviewed per EMR, new medications updated. Patient Active Problem List   Diagnosis Date Noted   Fibrosis of liver 04/18/2018   Macrocytic anemia 06/29/2016   Chronic renal insufficiency, stage III (moderate) (HCC) 06/29/2016   Acute right flank pain 06/29/2016   Chronic renal insufficiency 06/29/2016   Hemochromatosis 05/18/2016   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/18/2016   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   Macrocytosis 08/16/2013   Syncope 08/16/2013   DIVERTICULOSIS, COLON 11/04/2010   Past Medical History:  Diagnosis Date   Acute right flank pain 06/29/2016   Arthritis    "related to the lupus; joints" (08/16/2013)   Chronic renal insufficiency, stage III (moderate) (HCC) 06/29/2016   Discoid lupus  erythematosus 05/18/2016   Family history of  anesthesia complication    "daughter doesn't wake up easy from it" (08/16/2013)   Fibrosis of liver 04/18/2018   Hemochromatosis 05/18/2016   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   Hypertension    Iron deficiency anemia    Lumbar disc disease    Lupus (HCC)    "just the rash and arthritis" (08/16/2013)   Macrocytic anemia 06/29/2016   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/18/2016   Scoliosis    Syncope and collapse    "lost consciousness ~ 4-5 seconds; didn't hurt myself" (08/16/2013)   Family History  Problem Relation Age of Onset   Hypertension Mother    Hypertension Father    Past Surgical History:  Procedure Laterality Date   CATARACT EXTRACTION Bilateral 10/19 11/19   CESAREAN SECTION  1967; 1971   MOUTH SURGERY  2013   " infection under bridge cleaned out, etc" (08/16/2013)   TUBAL LIGATION     Social History   Occupational History   Occupation: retired  Tobacco Use   Smoking status: Current Every Day Smoker    Packs/day: 1.00    Years: 48.00    Pack years: 48.00    Types: Cigarettes   Smokeless tobacco: Never Used  Substance and Sexual Activity   Alcohol use: Yes    Alcohol/week: 21.0 standard drinks    Types: 21 Cans of beer per week    Comment:  3-4 drinks daily sometimes.   Drug use: No   Sexual activity: Not Currently    Partners: Male    Birth control/protection: Post-menopausal

## 2019-04-20 NOTE — Procedures (Signed)
Lumbar Facet Joint Nerve Denervation  Patient: Catherine Harrington      Date of Birth: Aug 31, 1946 MRN: [id number] PCP: Nila Nephew, MD      Visit Date: 04/19/2019   Universal Protocol:    Date/Time: 05/22/206:15 AM  Consent Given By: the patient  Position: PRONE  Additional Comments: Vital signs were monitored before and after the procedure. Patient was prepped and draped in the usual sterile fashion. The correct patient, procedure, and site was verified.   Injection Procedure Details:  Procedure Site One Meds Administered:  Meds ordered this encounter  Medications  . methylPREDNISolone acetate (DEPO-MEDROL) injection 80 mg  . baclofen (LIORESAL) 10 MG tablet    Sig: Take 1/2 to 1 by mouth every 8hrs as needed for spasm    Dispense:  60 tablet    Refill:  0     Laterality: Left  Location/Site:  L3-L4 L4-L5 L5-S1  Needle size: 18 G  Needle type: Radiofrequency cannula  Needle Placement: Along juncture of superior articular process and transverse pocess  Findings:  -Comments:  Procedure Details: For each desired target nerve, the corresponding transverse process (sacral ala for the L5 dorsal rami) was identified and the fluoroscope was positioned to square off the endplates of the corresponding vertebral body to achieve a true AP midline view.  The beam was then obliqued 15 to 20 degrees and caudally tilted 15 to 20 degrees to line up a trajectory along the target nerves. The skin over the target of the junction of superior articulating process and transverse process (sacral ala for the L5 dorsal rami) was infiltrated with 78ml of 1% Lidocaine without Epinephrine.  The 18 gauge 32mm active tip outer cannula was advanced in trajectory view to the target.  This procedure was repeated for each target nerve.  Then, for all levels, the outer cannula placement was fine-tuned and the position was then confirmed with bi-planar imaging.    Test stimulation was done both at sensory  and motor levels to ensure there was no radicular stimulation. The target tissues were then infiltrated with 1 ml of 1% Lidocaine without Epinephrine. Subsequently, a percutaneous neurotomy was carried out for 90 seconds at 80 degrees Celsius.  After the completion of the lesion, 1 ml of injectate was delivered. It was then repeated for each facet joint nerve mentioned above. Appropriate radiographs were obtained to verify the probe placement during the neurotomy.   Additional Comments:  The patient tolerated the procedure well Dressing: 2 x 2 sterile gauze and Band-Aid    Post-procedure details: Patient was observed during the procedure. Post-procedure instructions were reviewed.  Patient left the clinic in stable condition.

## 2019-04-25 ENCOUNTER — Telehealth: Payer: Self-pay | Admitting: Physical Medicine and Rehabilitation

## 2019-04-26 NOTE — Telephone Encounter (Signed)
Can you make a quick call to [facility] physical therapy and see if they are currently seeing patients and if not we could see if the [facility] physical therapy folks are doing that as well.

## 2019-04-26 NOTE — Telephone Encounter (Signed)
[facility] PT is scheduling patients. I called patient to notify and they will try to pick up new rx.

## 2019-04-28 ENCOUNTER — Telehealth: Payer: Self-pay

## 2019-04-28 ENCOUNTER — Other Ambulatory Visit: Payer: Medicare Other

## 2019-04-28 DIAGNOSIS — Z20822 Contact with and (suspected) exposure to covid-19: Secondary | ICD-10-CM

## 2019-04-28 NOTE — Addendum Note (Signed)
Addended by: Lonia Farber E on: 04/28/2019 11:57 AM   Modules accepted: Orders

## 2019-04-28 NOTE — Telephone Encounter (Signed)
Attempted call for potential COVID-19 exposure. No answer, left message for patient  to call [phone] [facility]

## 2019-04-30 LAB — NOVEL CORONAVIRUS, NAA: SARS-CoV-2, NAA: NOT DETECTED

## 2019-05-18 NOTE — Progress Notes (Signed)
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 female is a here because of Hereditary hemochromatosis. She was previously under the care of Dr Cyndie ChimeGranfortuna and has transferred her care to me. She presents to the clinic alone.   She notes this was discovered by dermatologist by lab work as she has lupus of skin. She had anemia for years before this diagnosis and was treated with oral iron. She denies other family having this mutation. She notes she has a 490 yo brother and her other siblings passed. She denies known kidney or liver issues for her or her family.  She use to have phlebotomies more frequently now she has it about every 3 months. She required ativan for her phlebotomies.   Today she notes having intermittent leg cramps at night. She takes gabapentin for this. She notes when she bends forward she will experience parts of her abdomen to protrude. She is not sure if this is hernia. This does cause her pain.   Socially she is married with adult children. She has severe scoliosis, on baclofen. She does have arthritis as well. She does have Lupus of her skin for which she sees her dermatologist. She was on Plaquenil previously. She does have HTN.    REVIEW OF SYSTEMS:   Constitutional: Denies fevers, chills or abnormal night sweats Eyes: Denies blurriness of vision, double vision or watery eyes Ears, nose, mouth, throat, and face: Denies mucositis or sore throat Respiratory: Denies cough, dyspnea or wheezes Cardiovascular: Denies palpitation, chest discomfort or lower extremity swelling Gastrointestinal:  Denies nausea, heartburn or change in bowel habits (+)  Possible hernia  Skin: Denies abnormal skin rashes MSK: (+) arthritis of back and hands  Lymphatics: Denies new lymphadenopathy or easy bruising Neurological:Denies numbness, tingling or new weaknesses Behavioral/Psych: Mood is stable, no new changes  All other systems were reviewed with the patient and are negative.   MEDICAL HISTORY:  Past Medical History:  Diagnosis Date  . Acute right flank pain 06/29/2016  . Arthritis    "related to the lupus; joints" (08/16/2013)  . Chronic renal insufficiency, stage III (moderate) (HCC) 06/29/2016  . 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 Family history of anesthesia complication    "daughter doesn't wake up easy from it" (08/16/2013)  . Fibrosis of liver 04/18/2018  . 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 Hypertension   . Iron deficiency anemia   . Lumbar disc disease   . Lupus (HCC)    "just the rash and arthritis" (08/16/2013)  . Macrocytic anemia 06/29/2016  . 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 Scoliosis   . Syncope and collapse    "lost consciousness ~ 4-5 seconds; didn't hurt myself" (08/16/2013)    SURGICAL HISTORY: Past Surgical History:  Procedure Laterality Date  . CATARACT EXTRACTION Bilateral 10/19 11/19  . CESAREAN SECTION  1967; 1971  . MOUTH SURGERY  2013   " infection under bridge cleaned out, etc" (08/16/2013)  . TUBAL LIGATION      SOCIAL HISTORY: Social History   Socioeconomic History  . Marital status: Married    Spouse name: Alinda Moneyony  . Number of children: 2  . Years of education: college  .  Highest education level: Not on file  Occupational History  . Occupation: retired  Scientific laboratory technician  . Financial resource strain: Not on file  . Food insecurity    Worry: Not on file    Inability: Not on file  . Transportation needs    Medical: Not on file    Non-medical: Not on file  Tobacco Use  . Smoking status: Current Every Day Smoker    Packs/day: 1.00    Years: 48.00    Pack  years: 48.00    Types: Cigarettes  . Smokeless tobacco: Never Used  Substance and Sexual Activity  . Alcohol use: Yes    Alcohol/week: 21.0 standard drinks    Types: 21 Cans of beer per week    Comment:  3-4 drinks daily sometimes.  . Drug use: No  . Sexual activity: Not Currently    Partners: Male    Birth control/protection: Post-menopausal  Lifestyle  . Physical activity    Days per week: Not on file    Minutes per session: Not on file  . Stress: Not on file  Relationships  . Social Herbalist on phone: Not on file    Gets together: Not on file    Attends religious service: Not on file    Active member of club or organization: Not on file    Attends meetings of clubs or organizations: Not on file    Relationship status: Not on file  . Intimate partner violence    Fear of current or ex partner: Not on file    Emotionally abused: Not on file    Physically abused: Not on file    Forced sexual activity: Not on file  Other Topics Concern  . Not on file  Social History Narrative   Lives with husband   Drinks caffeine 3 cups a day    FAMILY HISTORY: Family History  Problem Relation Age of Onset  . Hypertension Mother   . Hypertension Father     ALLERGIES:  is allergic to prednisone; sulfonamide derivatives; epinephrine; and midol [ibuprofen].  MEDICATIONS:  Current Outpatient Medications  Medication Sig Dispense Refill  . baclofen (LIORESAL) 10 MG tablet Take 1/2 to 1 by mouth every 8hrs as needed for spasm 60 tablet 0  . calcium carbonate (OS-CAL) 600 MG TABS Take 600 mg by mouth 2 (two) times daily with a meal.      . doxazosin (CARDURA) 8 MG tablet Take 8 mg by mouth at bedtime.      . fexofenadine (ALLEGRA) 180 MG tablet Take 180 mg by mouth daily.      Marland Kitchen gabapentin (NEURONTIN) 300 MG capsule     . levothyroxine (SYNTHROID, LEVOTHROID) 50 MCG tablet Take 50 mcg by mouth daily before breakfast.    . LORazepam (ATIVAN) 1 MG tablet Take 1-2 tablets (1-2  mg total) by mouth every 8 (eight) hours as needed for anxiety or sedation (take one or two tabs 1 hour before phlebotomy). 30 tablet 0  . losartan (COZAAR) 100 MG tablet Take 100 mg by mouth daily.      . Magnesium 500 MG CAPS Take by mouth.    . milk thistle 175 MG tablet Take 250 mg by mouth daily.    . Multiple Vitamin (MULTIVITAMIN) tablet Take 1 tablet by mouth daily.      . Potassium Gluconate 595 MG CAPS Take by mouth.    . triazolam (HALCION) 0.25 MG tablet Take 1 tab by mouth 1 hour  prior to procedure with very light food, do not drive motor vehicle. 2 tablet 0  . vitamin B-12 (CYANOCOBALAMIN) 1000 MCG tablet Take 1,000 mcg by mouth daily.     Current Facility-Administered Medications  Medication Dose Route Frequency Provider Last Rate Last Dose  . sodium chloride 0.9 % injection 250 mL  250 mL Intravenous Once Burns SpainButcher, Elizabeth A, MD        PHYSICAL EXAMINATION: ECOG PERFORMANCE STATUS: 3 - Symptomatic, >50% confined to bed  Vitals:   05/24/19 1319  BP: 135/71  Pulse: 83  Resp: 18  Temp: 98.3 F (36.8 C)  SpO2: 99%   Filed Weights   05/24/19 1319  Weight: 100 lb (45.4 kg)    GENERAL:alert, no distress and comfortable SKIN: skin color, texture, turgor are normal, no rashes or significant lesions EYES: normal, Conjunctiva are pink and non-injected, sclera clear  NECK: supple, thyroid normal size, non-tender, without nodularity LYMPH:  no palpable lymphadenopathy in the cervical, axillary  LUNGS: clear to auscultation and percussion with normal breathing effort HEART: regular rate & rhythm and no murmurs and no lower extremity edema ABDOMEN:abdomen soft, non-tender and normal bowel sounds Musculoskeletal:no cyanosis of digits and no clubbing  NEURO: alert & oriented x 3 with fluent speech, no focal motor/sensory deficits  LABORATORY DATA:  I have reviewed the data as listed CBC Latest Ref Rng & Units 05/24/2019 02/12/2019 02/06/2019  WBC 4.0 - 10.5 K/uL 3.5(L) 4.6 -   Hemoglobin 12.0 - 15.0 g/dL 11.3(L) 10.6(L) 11.7(L)  Hematocrit 36.0 - 46.0 % 33.5(L) 30.0(L) -  Platelets 150 - 400 K/uL 211 155 -    CMP Latest Ref Rng & Units 05/24/2019 02/12/2019 10/17/2018  Glucose 70 - 99 mg/dL 88 96 098(J102(H)  BUN 8 - 23 mg/dL 12 11 12   Creatinine 0.44 - 1.00 mg/dL 1.91(Y1.13(H) 7.82(N1.09(H) 5.62(Z1.14(H)  Sodium 135 - 145 mmol/L 134(L) 137 137  Potassium 3.5 - 5.1 mmol/L 4.5 4.6 4.3  Chloride 98 - 111 mmol/L 103 102 104  CO2 22 - 32 mmol/L 20(L) 20 24  Calcium 8.9 - 10.3 mg/dL 9.3 9.9 9.4  Total Protein 6.5 - 8.1 g/dL 7.7 6.9 7.5  Total Bilirubin 0.3 - 1.2 mg/dL 0.3 0.3 1.0  Alkaline Phos 38 - 126 U/L 64 56 49  AST 15 - 41 U/L 53(H) 35 56(H)  ALT 0 - 44 U/L 24 24 32     RADIOGRAPHIC STUDIES: I have personally reviewed the radiological images as listed and agreed with the findings in the report. No results found.  ASSESSMENT & PLAN:  Catherine KailSusan Harrington is a 73 y.o. Caucasian female with   1. 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  She never had liver biopsy or liver MRI.  -We discussed that interpretation of the iron study is confounded by an underlying lupus which might lead to chronic inflammation and potentially autoimmune hepatitis, and possible coexisting liver disease from long-standing alcohol use. She was also on chronic iron supplement due to her anemia. Most people with C282Y heterozygous does not presented with iron overload and hemochromatosis.  To evaluate iron deposition, and decide she needs chronic phlebotomy.  Due to her longstanding mild anemia, phlebotomy is not easy on her. She is not able to afford iron chelating agent  -She is receiving Phlebotomies about every 3 months currently. She has required Ativan with procedure.  -  Labs reviewed, CBC and CMP  WNL except WBC 3.5, Hg 11.3, Cf 1.13, AST 53. Iron panel normal, ferritin is still pending.  -Physical exam normal today  -f/u in 8 months with lab in 4 months   2. Chronic macrocytic anemia -Macrocytosis may be secondary to previous chronic alcohol use and hypothyroidism. Normal B12 and folic acid. Myelodysplastic syndrome not excluded. -Labs reviewed today, Hg 11.3, MCV 103.7 (05/24/19), overall stable   3. Discoid Lupus   -she was seen and managed by Dermatologist before, but not lately  -She does not currently have skin rash, not on any treatment   4. 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   5. Hypothyroid, HTN -On Levothyroxine and losartan   6. Smoking Cessation  -She smokes about 1 ppd  -I reviewed smoking cessation as this can negatively impact her overall health. She understands, but is not interested in quitting at this time.    7. Possible abdominal Hernia  -I recommend she see a general surgeon for further evaluation.    PLAN:  -MRI liver wo contrast  in 1-2 weeks to evaluate liver iron deposition  -Lab in 4 months  -Lab and f/u in 8 months     Orders Placed This Encounter  Procedures  . MR Abdomen Wo Contrast    Pt needs open MRI, evaluate iron deposition in liver    Standing Status:   Future    Standing Expiration Date:   05/23/2020    Order Specific Question:   What is the patient's sedation requirement?    Answer:   No Sedation    Order Specific Question:   Does the patient have a pacemaker or implanted devices?    Answer:   No    Order Specific Question:   Preferred imaging location?    Answer:   [facility] [place] (table limit-350lbs)    Order Specific Question:   Radiology Contrast Protocol - do NOT remove file path    Answer:   [email or web address]    All questions were  answered. The patient knows to call the clinic with any problems, questions or concerns. I spent 30 minutes counseling the patient face to face. The total time spent in the appointment was 40 minutes and more than 50% was on counseling.     Malachy MoodYan Madden Garron, MD 05/24/2019 4:02 PM  I, Delphina CahillAmoya Bennett, am acting as scribe for Malachy MoodYan Markiesha Delia, MD.   I have reviewed the above documentation for accuracy and completeness, and I agree with the above.

## 2019-05-21 ENCOUNTER — Telehealth: Payer: Self-pay | Admitting: Physical Medicine and Rehabilitation

## 2019-05-21 NOTE — Telephone Encounter (Signed)
Ok OV but really needs MRI lumbar and MRI pelvis

## 2019-05-22 NOTE — Telephone Encounter (Signed)
Scheduled for 7/8 at 1030.

## 2019-05-23 ENCOUNTER — Other Ambulatory Visit: Payer: Self-pay | Admitting: Hematology

## 2019-05-24 ENCOUNTER — Inpatient Hospital Stay: Payer: Medicare Other | Attending: Hematology | Admitting: Hematology

## 2019-05-24 ENCOUNTER — Telehealth: Payer: Self-pay | Admitting: Hematology

## 2019-05-24 ENCOUNTER — Inpatient Hospital Stay: Payer: Medicare Other

## 2019-05-24 ENCOUNTER — Encounter: Payer: Self-pay | Admitting: Hematology

## 2019-05-24 ENCOUNTER — Other Ambulatory Visit: Payer: Self-pay

## 2019-05-24 DIAGNOSIS — D539 Nutritional anemia, unspecified: Secondary | ICD-10-CM | POA: Diagnosis not present

## 2019-05-24 DIAGNOSIS — M419 Scoliosis, unspecified: Secondary | ICD-10-CM | POA: Diagnosis not present

## 2019-05-24 DIAGNOSIS — I10 Essential (primary) hypertension: Secondary | ICD-10-CM | POA: Diagnosis not present

## 2019-05-24 DIAGNOSIS — Z79899 Other long term (current) drug therapy: Secondary | ICD-10-CM

## 2019-05-24 DIAGNOSIS — E039 Hypothyroidism, unspecified: Secondary | ICD-10-CM

## 2019-05-24 DIAGNOSIS — F17210 Nicotine dependence, cigarettes, uncomplicated: Secondary | ICD-10-CM | POA: Diagnosis not present

## 2019-05-24 DIAGNOSIS — D7589 Other specified diseases of blood and blood-forming organs: Secondary | ICD-10-CM

## 2019-05-24 DIAGNOSIS — M329 Systemic lupus erythematosus, unspecified: Secondary | ICD-10-CM | POA: Diagnosis not present

## 2019-05-24 DIAGNOSIS — R252 Cramp and spasm: Secondary | ICD-10-CM

## 2019-05-24 DIAGNOSIS — N183 Chronic kidney disease, stage 3 (moderate): Secondary | ICD-10-CM | POA: Diagnosis not present

## 2019-05-24 LAB — CBC WITH DIFFERENTIAL (CANCER CENTER ONLY)
Abs Immature Granulocytes: 0.02 10*3/uL (ref 0.00–0.07)
Basophils Absolute: 0 10*3/uL (ref 0.0–0.1)
Basophils Relative: 1 %
Eosinophils Absolute: 0.1 10*3/uL (ref 0.0–0.5)
Eosinophils Relative: 1 %
HCT: 33.5 % — ABNORMAL LOW (ref 36.0–46.0)
Hemoglobin: 11.3 g/dL — ABNORMAL LOW (ref 12.0–15.0)
Immature Granulocytes: 1 %
Lymphocytes Relative: 26 %
Lymphs Abs: 0.9 10*3/uL (ref 0.7–4.0)
MCH: 35 pg — ABNORMAL HIGH (ref 26.0–34.0)
MCHC: 33.7 g/dL (ref 30.0–36.0)
MCV: 103.7 fL — ABNORMAL HIGH (ref 80.0–100.0)
Monocytes Absolute: 0.4 10*3/uL (ref 0.1–1.0)
Monocytes Relative: 10 %
Neutro Abs: 2.1 10*3/uL (ref 1.7–7.7)
Neutrophils Relative %: 61 %
Platelet Count: 211 10*3/uL (ref 150–400)
RBC: 3.23 MIL/uL — ABNORMAL LOW (ref 3.87–5.11)
RDW: 12.6 % (ref 11.5–15.5)
WBC Count: 3.5 10*3/uL — ABNORMAL LOW (ref 4.0–10.5)
nRBC: 0 % (ref 0.0–0.2)

## 2019-05-24 LAB — CMP (CANCER CENTER ONLY)
ALT: 24 U/L (ref 0–44)
AST: 53 U/L — ABNORMAL HIGH (ref 15–41)
Albumin: 3.7 g/dL (ref 3.5–5.0)
Alkaline Phosphatase: 64 U/L (ref 38–126)
Anion gap: 11 (ref 5–15)
BUN: 12 mg/dL (ref 8–23)
CO2: 20 mmol/L — ABNORMAL LOW (ref 22–32)
Calcium: 9.3 mg/dL (ref 8.9–10.3)
Chloride: 103 mmol/L (ref 98–111)
Creatinine: 1.13 mg/dL — ABNORMAL HIGH (ref 0.44–1.00)
GFR, Est AFR Am: 56 mL/min — ABNORMAL LOW (ref >60)
GFR, Estimated: 48 mL/min — ABNORMAL LOW (ref >60)
Glucose, Bld: 88 mg/dL (ref 70–99)
Potassium: 4.5 mmol/L (ref 3.5–5.1)
Sodium: 134 mmol/L — ABNORMAL LOW (ref 135–145)
Total Bilirubin: 0.3 mg/dL (ref 0.3–1.2)
Total Protein: 7.7 g/dL (ref 6.5–8.1)

## 2019-05-24 LAB — FERRITIN: Ferritin: 153 ng/mL (ref 11–307)

## 2019-05-24 LAB — IRON AND TIBC
Iron: 78 ug/dL (ref 41–142)
Saturation Ratios: 28 % (ref 21–57)
TIBC: 273 ug/dL (ref 236–444)
UIBC: 195 ug/dL (ref 120–384)

## 2019-05-24 NOTE — Telephone Encounter (Signed)
Scheduled appt per 6/25 los. Spoke with patient and she is awaer of her appt date and time.

## 2019-06-06 ENCOUNTER — Other Ambulatory Visit: Payer: Self-pay

## 2019-06-06 ENCOUNTER — Ambulatory Visit (INDEPENDENT_AMBULATORY_CARE_PROVIDER_SITE_OTHER): Payer: Medicare Other | Admitting: Physical Medicine and Rehabilitation

## 2019-06-06 VITALS — BP 122/66 | HR 80

## 2019-06-06 DIAGNOSIS — G8929 Other chronic pain: Secondary | ICD-10-CM | POA: Diagnosis not present

## 2019-06-06 DIAGNOSIS — M545 Low back pain, unspecified: Secondary | ICD-10-CM

## 2019-06-06 DIAGNOSIS — M609 Myositis, unspecified: Secondary | ICD-10-CM

## 2019-06-06 DIAGNOSIS — M419 Scoliosis, unspecified: Secondary | ICD-10-CM

## 2019-06-06 DIAGNOSIS — G894 Chronic pain syndrome: Secondary | ICD-10-CM

## 2019-06-06 NOTE — Progress Notes (Signed)
  Numeric Pain Rating Scale and Functional Assessment Average Pain 4   In the last MONTH (on 0-10 scale) has pain interfered with the following?  1. General activity like being  able to carry out your everyday physical activities such as walking, climbing stairs, carrying groceries, or moving a chair?  Rating(10)

## 2019-06-11 ENCOUNTER — Telehealth: Payer: Self-pay | Admitting: Radiology

## 2019-07-24 ENCOUNTER — Telehealth: Payer: Self-pay | Admitting: Physical Medicine and Rehabilitation

## 2019-07-25 NOTE — Telephone Encounter (Signed)
Called patient to schedule. No answer after multiple rings and no VM.

## 2019-07-25 NOTE — Telephone Encounter (Signed)
[place] for OV/trigger

## 2019-07-27 NOTE — Telephone Encounter (Signed)
Pt scheduled for 08/08/2019

## 2019-08-08 ENCOUNTER — Ambulatory Visit (INDEPENDENT_AMBULATORY_CARE_PROVIDER_SITE_OTHER): Payer: Medicare Other | Admitting: Physical Medicine and Rehabilitation

## 2019-08-08 ENCOUNTER — Other Ambulatory Visit: Payer: Self-pay

## 2019-08-08 ENCOUNTER — Encounter: Payer: Self-pay | Admitting: Physical Medicine and Rehabilitation

## 2019-08-08 VITALS — BP 134/71 | HR 82

## 2019-08-08 DIAGNOSIS — M609 Myositis, unspecified: Secondary | ICD-10-CM

## 2019-08-08 DIAGNOSIS — M4125 Other idiopathic scoliosis, thoracolumbar region: Secondary | ICD-10-CM | POA: Diagnosis not present

## 2019-08-08 DIAGNOSIS — M545 Low back pain, unspecified: Secondary | ICD-10-CM

## 2019-08-08 DIAGNOSIS — G8929 Other chronic pain: Secondary | ICD-10-CM

## 2019-08-08 DIAGNOSIS — M47816 Spondylosis without myelopathy or radiculopathy, lumbar region: Secondary | ICD-10-CM

## 2019-08-08 NOTE — Progress Notes (Signed)
  Numeric Pain Rating Scale and Functional Assessment Average Pain 8   In the last MONTH (on 0-10 scale) has pain interfered with the following?  1. General activity like being  able to carry out your everyday physical activities such as walking, climbing stairs, carrying groceries, or moving a chair?  Rating(10)    

## 2019-08-31 ENCOUNTER — Other Ambulatory Visit: Payer: Self-pay | Admitting: Internal Medicine

## 2019-08-31 DIAGNOSIS — Z1231 Encounter for screening mammogram for malignant neoplasm of breast: Secondary | ICD-10-CM

## 2019-09-01 ENCOUNTER — Encounter: Payer: Self-pay | Admitting: Physical Medicine and Rehabilitation

## 2019-09-01 DIAGNOSIS — M545 Low back pain: Secondary | ICD-10-CM | POA: Diagnosis not present

## 2019-09-01 DIAGNOSIS — G8929 Other chronic pain: Secondary | ICD-10-CM | POA: Diagnosis not present

## 2019-09-01 MED ORDER — LIDOCAINE HCL (PF) 1 % IJ SOLN
3.0000 mL | INTRAMUSCULAR | Status: AC | PRN
  Administered 2019-09-01: 06:00:00 3 mL

## 2019-09-01 MED ORDER — TRIAMCINOLONE ACETONIDE 40 MG/ML IJ SUSP
40.0000 mg | INTRAMUSCULAR | Status: AC | PRN
  Administered 2019-09-01: 40 mg via INTRAMUSCULAR

## 2019-09-01 NOTE — Progress Notes (Signed)
Catherine Harrington - 73 y.o. female MRN [id number]  Date of birth: 03-26-1946  Office Visit Note: Visit Date: 08/08/2019 PCP: Nila Nephew, MD Referred by: Nila Nephew, MD  Subjective: Chief Complaint  Patient presents with   Lower Back - Pain   HPI: Catherine Harrington is a 73 y.o. female who comes in today For evaluation and management of chronic worsening right buttock pain.  We have seen the patient on many occasions and know her and her husband quite well.  Her case is very complicated by hemochromatosis for which she does or has been having therapeutic phlebotomy.  She has had a history of liver fibrosis and a history of chronic pain syndrome and scoliosis of the lumbar spine.  She has been maintained from a pain standpoint on a combination of physical therapy and dry needling and trigger point injections by myself as well as epidural injections.  She comes in today without any frank radicular symptoms.  Pain still worse with standing and movement and fairly constant.  Pain is in the right buttock region really at the quadratus lumborum area or may be in the area over the iliac crest on the right.  She has had no trauma or falls recently.  She has had a history of real intolerance to medications.  We have actually tried her with even different opioid treatments without any results.  She rates her pain as an 8 out of 10 and it really limits what she can do at this point all around the house.  She feels some clicking in her back when she walks and this is worse after standing for a while.  Review of Systems  Constitutional: Negative for chills, fever, malaise/fatigue and weight loss.  HENT: Negative for hearing loss and sinus pain.   Eyes: Negative for blurred vision, double vision and photophobia.  Respiratory: Negative for cough and shortness of breath.   Cardiovascular: Negative for chest pain, palpitations and leg swelling.  Gastrointestinal: Negative for abdominal pain, nausea and vomiting.    Genitourinary: Negative for flank pain.  Musculoskeletal: Positive for back pain and joint pain. Negative for myalgias.  Skin: Negative for itching and rash.  Neurological: Positive for weakness. Negative for tremors and focal weakness.  Endo/Heme/Allergies: Negative.   Psychiatric/Behavioral: Negative for depression.  All other systems reviewed and are negative.  Otherwise per HPI.  Assessment & Plan: Visit Diagnoses:  1. Chronic right-sided low back pain without sciatica   2. Spondylosis without myelopathy or radiculopathy, lumbar region   3. Other idiopathic scoliosis, thoracolumbar region   4. Myofascitis     Plan: Findings:  Chronic pain syndrome chronic low back pain with history of scoliosis and some stenosis history of intermittent radicular pain.  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  She has a hard time doing any activities and exercise at times because her pain will just flareup.  She is a long-term chronic smoker.  At this point we did complete local injection over the quadratus lumborum tendon insertion on the right and I feel like this hopefully will give her some relief.  She has had dry needling in the past and physical therapy agrees with physical therapy and would consider referral back to them.  Medications have just not been very effective.  We talked about home exercises that maybe she could do to strengthen that are fairly easy on her.  Unfortunately do not really have much else to offer at this point.  Could consider spinal cord stimulator trial but she is just not someone I think is going to really even want to think about that.  We probably discussed that in the past.    Meds & Orders: No orders of the defined types were placed in this encounter.   Orders Placed This Encounter  Procedures   Trigger Point Inj    Follow-up: Return if symptoms worsen or fail to  improve.   Procedures: Quadratus lumborum muscle tendon insertion injection  Date/Time: 09/01/2019 6:25 AM Performed by: Tyrell AntonioNewton, Tafari Humiston, MD Authorized by: Tyrell AntonioNewton, Anyah Swallow, MD   Consent Given by:  Patient Site marked: the procedure site was marked   Timeout: prior to procedure the correct patient, procedure, and site was verified   Indications:  Pain Total # of Trigger Points:  1 Location: back   Needle Size:  25 G Approach:  Dorsal and lateral Medications #1:  40 mg triamcinolone acetonide 40 MG/ML; 3 mL lidocaine (PF) 1 % Patient tolerance:  Patient tolerated the procedure well with no immediate complications Comments: Quadratus lumborum muscle insertion tendon injection performed by palpating the insertion down to the lower sacral ala and iliac crest.  Patient is very thin and this can be done without fluoroscopic guidance.    No notes on file   Clinical History: MRI LUMBAR SPINE WITHOUT CONTRAST 05/09/2017  TECHNIQUE: Multiplanar, multisequence MR imaging of the lumbar spine was performed. No intravenous contrast was administered.  COMPARISON:  Lumbar spine MRI 06/11/2014  FINDINGS: Segmentation:  Normal  Alignment:  There is right convex lumbar scoliosis with apex at L3.  Vertebrae: Bone marrow signal is heterogeneous, unchanged. No evidence of discitis osteomyelitis. No acute compression fracture.  Conus medullaris: Extends to the L1 level and appears normal.  Paraspinal and other soft tissues: There is fatty atrophy of the posterior paraspinous muscles, slightly worse on the left.  Disc levels:  T9-T12: These levels are assessed on sagittal sequences only. No disc herniation, spinal canal stenosis or neural foraminal narrowing.  T12-L1: Normal disc space and facets. No spinal canal or neuroforaminal stenosis.  L1-L2: Severe disc space narrowing with minimal bulge. Moderate right and mild left neural foraminal narrowing, primarily caused  by scoliotic curvature.  L2-L3: Severe disc space loss with endplate osteophytosis. No spinal canal stenosis. Moderate right and severe left neural foraminal stenosis, unchanged. Left-greater-than-right facet arthrosis.  L3-L4: Severe disc space narrowing. No spinal canal stenosis. There is unchanged severe left foraminal stenosis. Right neural foramen is widely patent.  L4-L5: Disc space narrowing with central disc protrusion, unchanged. Severe bilateral facet hypertrophy, also unchanged. No central spinal canal stenosis. Severe right and moderate left neural foraminal stenosis, unchanged.  L5-S1: Moderate bilateral facet hypertrophy but no stenosis.  Visualized sacrum: Normal.  IMPRESSION: 1. Unchanged appearance of marked lumbar dextroscoliosis, which, in combination degenerative disc disease and facet arthrosis, that leads to severe neural foraminal narrowing at multiple levels, including L2-3, L3-4 and L4-5. These findings are unchanged, however, compared to the MRI of 06/11/2014. 2. No central spinal canal stenosis of the lumbar spine. 3. Multilevel moderate to severe facet arthrosis, which may serve as a source of localized back pain.  Lspine MRI 06/11/14 There is moderate lumbar dextroscoliosis with apex at L3. There is left lateral listhesis of L2 on L3 and right lateral listhesis of L3 on L4 and L4 on L5. Bone marrow signal is somewhat heterogeneous diffusely and overall decreased in  signal in the lower thoracic spine (possibly reflecting patient's history of anemia) with prominent fatty degenerative marrow changes in the mid and lower lumbar spine. Modic type 1 degenerative marrow changes are also noted in this region. There is lateral recess narrowing at L2-3 on the right.   She reports that she has been smoking cigarettes. She has a 48.00 pack-year smoking history. She has never used smokeless tobacco. No results for input(s): HGBA1C, LABURIC in the last  8760 hours.  Objective:  VS:  HT:     WT:    BMI:      BP:134/71   HR:82bpm   TEMP: ( )   RESP:  Physical Exam Vitals signs and nursing note reviewed.  Constitutional:      General: She is not in acute distress.    Appearance: Normal appearance. She is well-developed. She is not ill-appearing.     Comments: Very thin  HENT:     Head: Normocephalic and atraumatic.  Eyes:     Conjunctiva/sclera: Conjunctivae normal.     Pupils: Pupils are equal, round, and reactive to light.  Cardiovascular:     Rate and Rhythm: Normal rate.     Pulses: Normal pulses.  Pulmonary:     Effort: Pulmonary effort is normal.  Musculoskeletal:     Right lower leg: No edema.     Left lower leg: No edema.     Comments: Patient stands with a forward flexed lumbar spine with some scoliotic curvature.  She is very slow to rise from a seated position and seems to have some balance difficulty.  She has pain over the right PSIS but also over the quadratus lumborum.  No pain on the left no pain over the greater trochanters no tender points.  She has good distal strength without clonus.  Skin:    General: Skin is warm and dry.     Findings: No erythema or rash.     Comments: Ruddy appearance  Neurological:     General: No focal deficit present.     Mental Status: She is alert and oriented to person, place, and time.     Sensory: No sensory deficit.     Motor: No abnormal muscle tone.     Coordination: Coordination normal.     Gait: Gait abnormal.  Psychiatric:        Mood and Affect: Mood normal.        Behavior: Behavior normal.     Ortho Exam Imaging: No results found.  Past Medical/Family/Surgical/Social History: Medications & Allergies reviewed per EMR, new medications updated. 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   Macrocytosis 08/16/2013   Syncope 08/16/2013   DIVERTICULOSIS, COLON 11/04/2010   Past Medical History:  Diagnosis Date   Acute right flank pain 06/29/2016   Arthritis    "related to the lupus; joints" (08/16/2013)   Chronic renal insufficiency, stage III (moderate) 06/29/2016   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   Family history of anesthesia complication    "daughter doesn't wake up easy from it" (08/16/2013)   Fibrosis of liver 04/18/2018   Hemochromatosis 05/18/2016   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   Hypertension    Iron deficiency anemia  Lumbar disc disease    Lupus ([facility])    "just the rash and arthritis" (08/16/2013)   Macrocytic anemia 06/29/2016   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/18/2016   Scoliosis    Syncope and collapse    "lost consciousness ~ 4-5 seconds; didn't hurt myself" (08/16/2013)   Family History  Problem Relation Age of Onset   Hypertension Mother    Hypertension Father    Past Surgical History:  Procedure Laterality Date   CATARACT EXTRACTION Bilateral 10/19 11/19   CESAREAN SECTION  1967; [place]  2013   " infection under bridge cleaned out, etc" (08/16/2013)   TUBAL LIGATION     Social History   Occupational History   Occupation: retired  Tobacco Use   Smoking status: Current Every Day Smoker    Packs/day: 1.00    Years: 48.00    Pack years: 48.00    Types: Cigarettes   Smokeless tobacco: Never Used  Substance and Sexual Activity   Alcohol use: Yes    Alcohol/week: 21.0 standard drinks    Types: 21 Cans of beer per week    Comment:  3-4 drinks daily sometimes.   Drug use: No   Sexual activity: Not Currently    Partners: Male    Birth control/protection: Post-menopausal

## 2019-09-06 ENCOUNTER — Encounter: Payer: Self-pay | Admitting: Physical Medicine and Rehabilitation

## 2019-09-06 NOTE — Progress Notes (Signed)
Catherine Harrington - 73 y.o. female MRN [id number]  Date of birth: 1946-07-04  Office Visit Note: Visit Date: 06/06/2019 PCP: Nila Nephew, MD Referred by: Nila Nephew, MD  Subjective: Chief Complaint  Patient presents with  . Lower Back - Pain   HPI: Catherine Harrington is a 73 y.o. female who comes in today For dilation management of low back pain and buttock pain status post radiofrequency ablation.  Patient rates her average pain is a 4 out of 10 which is actually fairly good for her.  She has been going to [facility] physical therapy as well and she is not sure if this is helping but she is getting more mobile.  She is reporting difficulty walking to the mailbox and having to rest several times to get there.  She has worked with transfer physical therapy off and on over the years particularly with [facility], DPT has done well with that.  She not having any real radicular complaints down the legs.  She reports the right-sided low back is really worse.  She is getting some right lower quadrant pain and wonders if she is getting a UTI.  She has not had any burning or frequency but she just wonders because she has had it before and it felt similar.  She has a comp gated chronic pain history with continued cigarette smoking as well as degenerative spine and scoliosis and hemochromatosis.  She has not had any new focal weakness or trauma or falls.  She has been prone to falls in the past.  She has not noted any paresthesias.  Last MRI was in 2018 and I think that is currently not at this point and she is not really getting any symptoms to suggest getting another MRI at this point although with her I have a low threshold of doing that.  Review of Systems  Constitutional: Negative for chills, fever, malaise/fatigue and weight loss.  HENT: Negative for hearing loss and sinus pain.   Eyes: Negative for blurred vision, double vision and photophobia.  Respiratory: Negative for cough and shortness of  breath.   Cardiovascular: Negative for chest pain, palpitations and leg swelling.  Gastrointestinal: Negative for abdominal pain, nausea and vomiting.  Genitourinary: Negative for flank pain.  Musculoskeletal: Positive for back pain. Negative for myalgias.  Skin: Negative for itching and rash.  Neurological: Negative for tremors, focal weakness and weakness.  Endo/Heme/Allergies: Negative.   Psychiatric/Behavioral: Negative for depression.  All other systems reviewed and are negative.  Otherwise per HPI.  Assessment & Plan: Visit Diagnoses:  1. Chronic bilateral low back pain without sciatica   2. Chronic pain syndrome   3. Scoliosis of thoracolumbar spine, unspecified scoliosis type   4. Myofascitis     Plan: Findings:  Chronic pain syndrome in the setting of significant lumbar degenerative change with facet arthropathy levels of lateral recess stenosis and scoliosis and continued long-term history of cigarette smoking which continues.  She has intermittent flareups of severity which can go to being very severe and unable to really do much to being fairly good and okay.  She does not tolerate medications at all.  We have tried different medications in the past and she just does not tolerate anything.  She is continuing with physical therapy at this point which I think is wise and I want her to finish with that.  Radiofrequency ablation still has some time that it could be effective and I will watch that as well.  Today I think her  best plan is to follow-up with her primary care physician just to see if she has any evidence of UTI although she is not really giving a whole lot of symptoms consistent with that but I think it would be worth following up.  We will see her back in the next few weeks if needed and at that point would possibly look at trigger point injection versus transforaminal epidural injection which is helped.    Meds & Orders: No orders of the defined types were placed in this  encounter.  No orders of the defined types were placed in this encounter.   Follow-up: No follow-ups on file.   Procedures: No procedures performed  No notes on file   Clinical History: MRI LUMBAR SPINE WITHOUT CONTRAST 05/09/2017  TECHNIQUE: Multiplanar, multisequence MR imaging of the lumbar spine was performed. No intravenous contrast was administered.  COMPARISON:  Lumbar spine MRI 06/11/2014  FINDINGS: Segmentation:  Normal  Alignment:  There is right convex lumbar scoliosis with apex at L3.  Vertebrae: Bone marrow signal is heterogeneous, unchanged. No evidence of discitis osteomyelitis. No acute compression fracture.  Conus medullaris: Extends to the L1 level and appears normal.  Paraspinal and other soft tissues: There is fatty atrophy of the posterior paraspinous muscles, slightly worse on the left.  Disc levels:  T9-T12: These levels are assessed on sagittal sequences only. No disc herniation, spinal canal stenosis or neural foraminal narrowing.  T12-L1: Normal disc space and facets. No spinal canal or neuroforaminal stenosis.  L1-L2: Severe disc space narrowing with minimal bulge. Moderate right and mild left neural foraminal narrowing, primarily caused by scoliotic curvature.  L2-L3: Severe disc space loss with endplate osteophytosis. No spinal canal stenosis. Moderate right and severe left neural foraminal stenosis, unchanged. Left-greater-than-right facet arthrosis.  L3-L4: Severe disc space narrowing. No spinal canal stenosis. There is unchanged severe left foraminal stenosis. Right neural foramen is widely patent.  L4-L5: Disc space narrowing with central disc protrusion, unchanged. Severe bilateral facet hypertrophy, also unchanged. No central spinal canal stenosis. Severe right and moderate left neural foraminal stenosis, unchanged.  L5-S1: Moderate bilateral facet hypertrophy but no stenosis.  Visualized sacrum: Normal.   IMPRESSION: 1. Unchanged appearance of marked lumbar dextroscoliosis, which, in combination degenerative disc disease and facet arthrosis, that leads to severe neural foraminal narrowing at multiple levels, including L2-3, L3-4 and L4-5. These findings are unchanged, however, compared to the MRI of 06/11/2014. 2. No central spinal canal stenosis of the lumbar spine. 3. Multilevel moderate to severe facet arthrosis, which may serve as a source of localized back pain.  Lspine MRI 06/11/14 There is moderate lumbar dextroscoliosis with apex at L3. There is left lateral listhesis of L2 on L3 and right lateral listhesis of L3 on L4 and L4 on L5. Bone marrow signal is somewhat heterogeneous diffusely and overall decreased in signal in the lower thoracic spine (possibly reflecting patient's history of anemia) with prominent fatty degenerative marrow changes in the mid and lower lumbar spine. Modic type 1 degenerative marrow changes are also noted in this region. There is lateral recess narrowing at L2-3 on the right.   She reports that she has been smoking cigarettes. She has a 48.00 pack-year smoking history. She has never used smokeless tobacco. No results for input(s): HGBA1C, LABURIC in the last 8760 hours.  Objective:  VS:  HT:    WT:   BMI:     BP:122/66  HR:80bpm  TEMP: ( )  RESP:  Physical Exam Vitals signs  and nursing note reviewed.  Constitutional:      General: She is not in acute distress.    Appearance: Normal appearance. She is well-developed. She is not ill-appearing.     Comments: Very thin appearing  HENT:     Head: Normocephalic and atraumatic.  Eyes:     Conjunctiva/sclera: Conjunctivae normal.     Pupils: Pupils are equal, round, and reactive to light.  Cardiovascular:     Rate and Rhythm: Normal rate.     Pulses: Normal pulses.  Pulmonary:     Effort: Pulmonary effort is normal.  Musculoskeletal:     Right lower leg: No edema.     Left lower leg: No edema.      Comments: Patient slow from a seated position to full extension.  She has obvious rightward scoliosis.  She ambulates with a forward X lumbar spine but without aid.  She has good distal strength in the legs bilaterally she has no clonus bilaterally.  She has no pain with hip rotation.  She does have pain over the lower lumbar region in the paraspinal and quadratus lumborum and PSIS region.  Skin:    General: Skin is warm and dry.     Findings: No erythema or rash.     Comments: Ruddy appearing skin  Neurological:     General: No focal deficit present.     Mental Status: She is alert and oriented to person, place, and time.     Sensory: No sensory deficit.     Motor: No abnormal muscle tone.     Coordination: Coordination normal.     Gait: Gait normal.  Psychiatric:        Mood and Affect: Mood normal.        Behavior: Behavior normal.     Ortho Exam Imaging: No results found.  Past Medical/Family/Surgical/Social History: Medications & Allergies reviewed per EMR, new medications updated. Patient Active Problem List   Diagnosis Date Noted  . Fibrosis of liver 04/18/2018  . Macrocytic anemia 06/29/2016  . Chronic renal insufficiency, stage III (moderate) 06/29/2016  . Acute right flank pain 06/29/2016  . Chronic renal insufficiency 06/29/2016  . Hemochromatosis 05/18/2016  . 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 Macrocytosis 08/16/2013  . Syncope 08/16/2013  . DIVERTICULOSIS, COLON 11/04/2010   Past Medical History:  Diagnosis Date  . Acute right flank pain 06/29/2016  . Arthritis    "related to the lupus; joints" (08/16/2013)  . Chronic renal insufficiency, stage III (moderate) 06/29/2016  . 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 Family history of anesthesia complication    "daughter doesn't wake up easy from it" (08/16/2013)  . Fibrosis of liver 04/18/2018  . 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  Hypertension   . Iron deficiency anemia   . Lumbar disc disease   . Lupus ([facility])    "just the rash and arthritis" (08/16/2013)  . Macrocytic anemia 06/29/2016  . 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 Scoliosis   . Syncope and collapse    "lost consciousness ~ 4-5 seconds; didn't hurt myself" (08/16/2013)   Family History  Problem Relation Age of Onset  . Hypertension Mother   . Hypertension Father    Past Surgical History:  Procedure Laterality Date  . CATARACT EXTRACTION Bilateral 10/19 11/19  . CESAREAN SECTION  1967; 1971  . MOUTH SURGERY  2013   " infection under bridge cleaned out, etc" (08/16/2013)  . TUBAL  LIGATION     Social History   Occupational History  . Occupation: retired  Tobacco Use  . Smoking status: Current Every Day Smoker    Packs/day: 1.00    Years: 48.00    Pack years: 48.00    Types: Cigarettes  . Smokeless tobacco: Never Used  Substance and Sexual Activity  . Alcohol use: Yes    Alcohol/week: 21.0 standard drinks    Types: 21 Cans of beer per week    Comment:  3-4 drinks daily sometimes.  . Drug use: No  . Sexual activity: Not Currently    Partners: Male    Birth control/protection: Post-menopausal

## 2019-09-24 ENCOUNTER — Inpatient Hospital Stay: Payer: Medicare Other | Attending: Hematology

## 2019-09-24 ENCOUNTER — Other Ambulatory Visit: Payer: Self-pay

## 2019-09-24 LAB — CBC WITH DIFFERENTIAL (CANCER CENTER ONLY)
Abs Immature Granulocytes: 0.01 10*3/uL (ref 0.00–0.07)
Basophils Absolute: 0 10*3/uL (ref 0.0–0.1)
Basophils Relative: 0 %
Eosinophils Absolute: 0 10*3/uL (ref 0.0–0.5)
Eosinophils Relative: 1 %
HCT: 31.9 % — ABNORMAL LOW (ref 36.0–46.0)
Hemoglobin: 10.9 g/dL — ABNORMAL LOW (ref 12.0–15.0)
Immature Granulocytes: 0 %
Lymphocytes Relative: 29 %
Lymphs Abs: 0.9 10*3/uL (ref 0.7–4.0)
MCH: 36.1 pg — ABNORMAL HIGH (ref 26.0–34.0)
MCHC: 34.2 g/dL (ref 30.0–36.0)
MCV: 105.6 fL — ABNORMAL HIGH (ref 80.0–100.0)
Monocytes Absolute: 0.4 10*3/uL (ref 0.1–1.0)
Monocytes Relative: 13 %
Neutro Abs: 1.7 10*3/uL (ref 1.7–7.7)
Neutrophils Relative %: 57 %
Platelet Count: 155 10*3/uL (ref 150–400)
RBC: 3.02 MIL/uL — ABNORMAL LOW (ref 3.87–5.11)
RDW: 12.8 % (ref 11.5–15.5)
WBC Count: 3 10*3/uL — ABNORMAL LOW (ref 4.0–10.5)
nRBC: 0 % (ref 0.0–0.2)

## 2019-09-24 LAB — FERRITIN: Ferritin: 186 ng/mL (ref 11–307)

## 2019-09-25 ENCOUNTER — Other Ambulatory Visit (INDEPENDENT_AMBULATORY_CARE_PROVIDER_SITE_OTHER): Payer: Self-pay | Admitting: Physical Medicine and Rehabilitation

## 2019-10-02 ENCOUNTER — Telehealth: Payer: Self-pay | Admitting: Emergency Medicine

## 2019-10-02 NOTE — Telephone Encounter (Signed)
Called pt, LVM providing lab information and that her phlebotomy is held at this time.  Provided phone number for [facility] radiology dept [phone]) for pt to schedule previously ordered liver MRI (chosen location as specified in order details).  Left call back number for pt when they have the MRI scheduled, and for any f/u questions or concerns.

## 2019-10-02 NOTE — Telephone Encounter (Signed)
-----   Message from Truitt Merle, MD sent at 10/02/2019  2:16 PM EST ----- Please let pt know the lab result, mild anemia overall stable, iron level litter higher than before. Due to her anemia, will hold on phlebotomy for this time/. I previously ordered liver MRI, but it has never been scheduled. If she is agreeable, please get it done before next visit.   Thanks  Krista Blue

## 2019-10-03 ENCOUNTER — Telehealth: Payer: Self-pay

## 2019-10-03 NOTE — Telephone Encounter (Signed)
Spoke with patient regarding lab results.  Per Dr. Burr Medico due to her anemia we will not proceed with phlebotomy at this time.  Also some months back Dr. Burr Medico wanted to get a MRI of her liver to check for iron deposition.  She is in agreement to proceed.  I explained someone would be getting back to her to get this schedule once approved.  She verbalized an understanding.

## 2019-10-08 ENCOUNTER — Telehealth: Payer: Self-pay | Admitting: Physical Medicine and Rehabilitation

## 2019-10-08 NOTE — Telephone Encounter (Signed)
Plan on right L4 tf esi but could think about triggerpoint injection

## 2019-10-09 NOTE — Telephone Encounter (Signed)
Scheduled for 11/12 at 1500 with driver.

## 2019-10-11 ENCOUNTER — Ambulatory Visit (INDEPENDENT_AMBULATORY_CARE_PROVIDER_SITE_OTHER): Payer: Medicare Other | Admitting: Physical Medicine and Rehabilitation

## 2019-10-11 ENCOUNTER — Other Ambulatory Visit: Payer: Self-pay

## 2019-10-11 ENCOUNTER — Encounter: Payer: Self-pay | Admitting: Physical Medicine and Rehabilitation

## 2019-10-11 ENCOUNTER — Ambulatory Visit: Payer: Self-pay

## 2019-10-11 VITALS — BP 124/68 | HR 96

## 2019-10-11 DIAGNOSIS — M419 Scoliosis, unspecified: Secondary | ICD-10-CM

## 2019-10-11 DIAGNOSIS — M5416 Radiculopathy, lumbar region: Secondary | ICD-10-CM | POA: Diagnosis not present

## 2019-10-11 DIAGNOSIS — M48061 Spinal stenosis, lumbar region without neurogenic claudication: Secondary | ICD-10-CM

## 2019-10-11 MED ORDER — BETAMETHASONE SOD PHOS & ACET 6 (3-3) MG/ML IJ SUSP
12.0000 mg | Freq: Once | INTRAMUSCULAR | Status: AC
  Administered 2019-10-11: 12 mg

## 2019-10-11 NOTE — Progress Notes (Signed)
 .  Numeric Pain Rating Scale and Functional Assessment Average Pain 9   In the last MONTH (on 0-10 scale) has pain interfered with the following?  1. General activity like being  able to carry out your everyday physical activities such as walking, climbing stairs, carrying groceries, or moving a chair?  Rating(9)   +Driver, -BT, -Dye Allergies.  

## 2019-10-16 ENCOUNTER — Other Ambulatory Visit: Payer: Self-pay

## 2019-10-16 ENCOUNTER — Ambulatory Visit
Admission: RE | Admit: 2019-10-16 | Discharge: 2019-10-16 | Disposition: A | Discharge status: Home / Self Care | Payer: Medicare Other | Source: Ambulatory Visit | Attending: Internal Medicine | Admitting: Internal Medicine

## 2019-10-16 DIAGNOSIS — Z1231 Encounter for screening mammogram for malignant neoplasm of breast: Secondary | ICD-10-CM

## 2019-11-07 ENCOUNTER — Telehealth: Payer: Self-pay

## 2019-11-22 NOTE — Telephone Encounter (Signed)
Error

## 2019-12-12 DIAGNOSIS — R197 Diarrhea, unspecified: Secondary | ICD-10-CM | POA: Insufficient documentation

## 2019-12-12 DIAGNOSIS — E039 Hypothyroidism, unspecified: Secondary | ICD-10-CM | POA: Insufficient documentation

## 2019-12-12 DIAGNOSIS — M5416 Radiculopathy, lumbar region: Secondary | ICD-10-CM | POA: Insufficient documentation

## 2019-12-12 DIAGNOSIS — I10 Essential (primary) hypertension: Secondary | ICD-10-CM | POA: Insufficient documentation

## 2019-12-12 DIAGNOSIS — Z720 Tobacco use: Secondary | ICD-10-CM | POA: Insufficient documentation

## 2019-12-25 ENCOUNTER — Ambulatory Visit: Payer: Medicare Other

## 2019-12-30 ENCOUNTER — Ambulatory Visit: Payer: Medicare Other

## 2019-12-31 ENCOUNTER — Ambulatory Visit: Payer: Medicare Other | Admitting: Podiatry

## 2020-01-03 ENCOUNTER — Ambulatory Visit: Payer: Medicare Other | Attending: Internal Medicine

## 2020-01-03 DIAGNOSIS — Z23 Encounter for immunization: Secondary | ICD-10-CM

## 2020-01-03 NOTE — Progress Notes (Signed)
   Covid-19 Vaccination Clinic  Name:  Areyana Leoni    MRN: [id number] DOB: Oct 28, 1946  01/03/2020  Ms. Tigue was observed post Covid-19 immunization for 15 minutes without incidence. She was provided with Vaccine Information Sheet and instruction to access the V-Safe system.   Ms. Clover was instructed to call 911 with any severe reactions post vaccine: Marland Kitchen Difficulty breathing  . Swelling of your face and throat  . A fast heartbeat  . A bad rash all over your body  . Dizziness and weakness    Immunizations Administered    Name Date Dose VIS Date Route   Pfizer COVID-19 Vaccine 01/03/2020  5:55 PM 0.3 mL 11/09/2019 Intramuscular   Manufacturer: [employer], [employer]   Lot: [id number]   NDC: [address]-2

## 2020-01-05 IMAGING — MG DIGITAL SCREENING BILATERAL MAMMOGRAM WITH TOMO AND CAD
8 series · 9 of 24 positions shown · non-contrast
Comparison: Previous exam(s).

CLINICAL DATA: Screening.

EXAM:
DIGITAL SCREENING BILATERAL MAMMOGRAM WITH TOMO AND CAD

[R CC synth-2D]
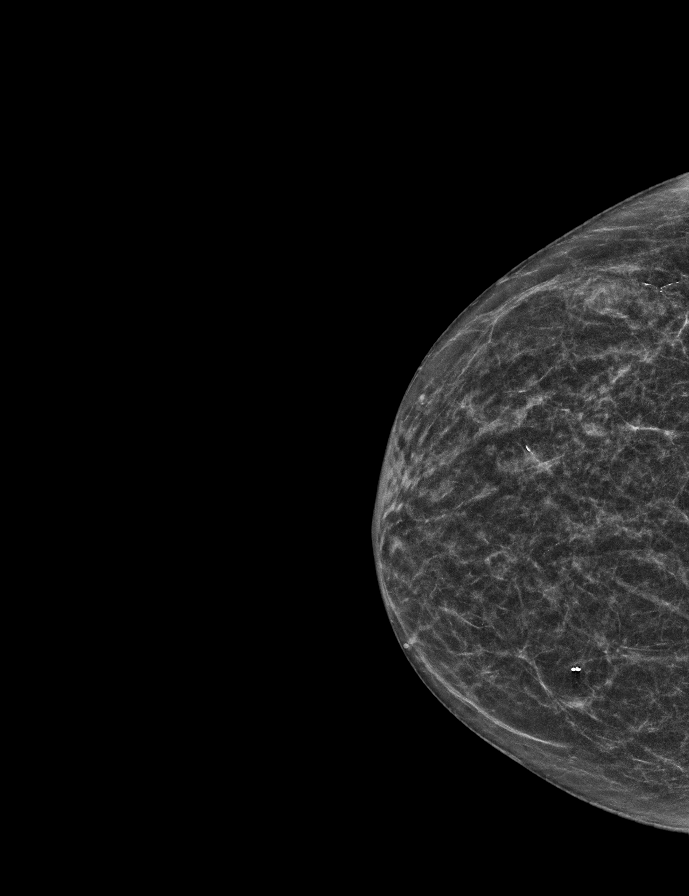

[L CC synth-2D]
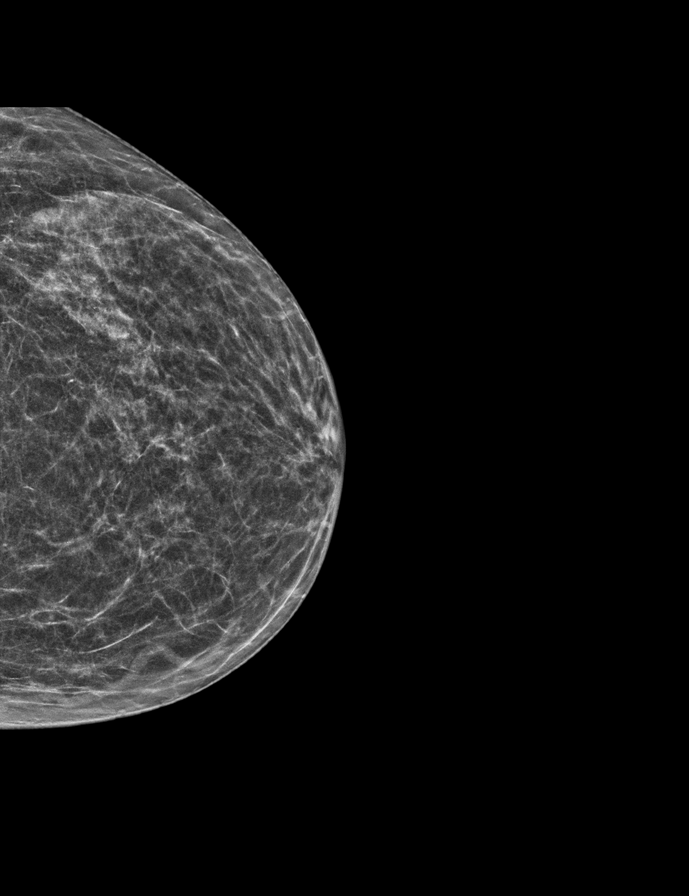

[R MLO synth-2D]
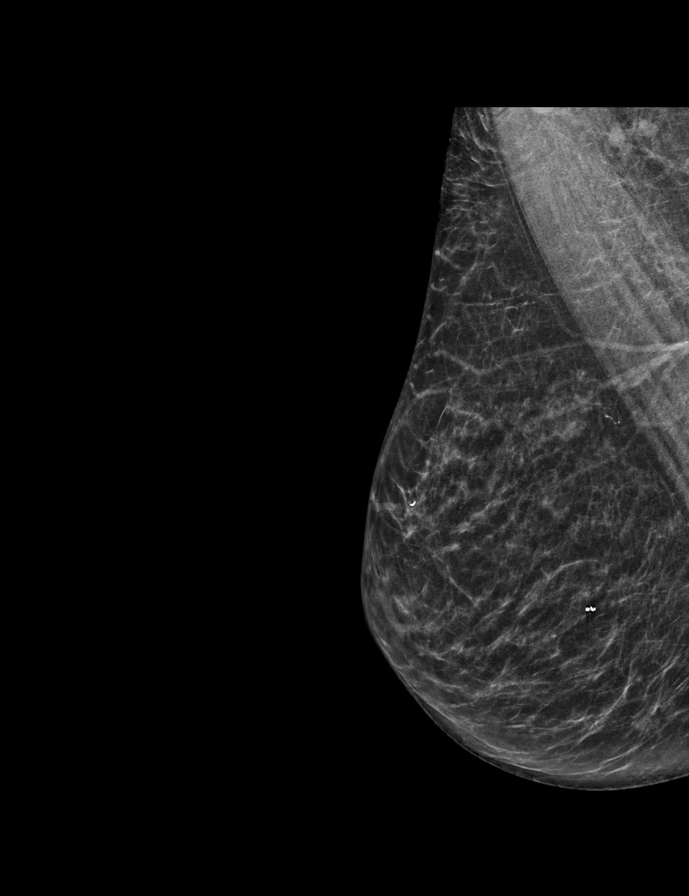

[L MLO synth-2D]
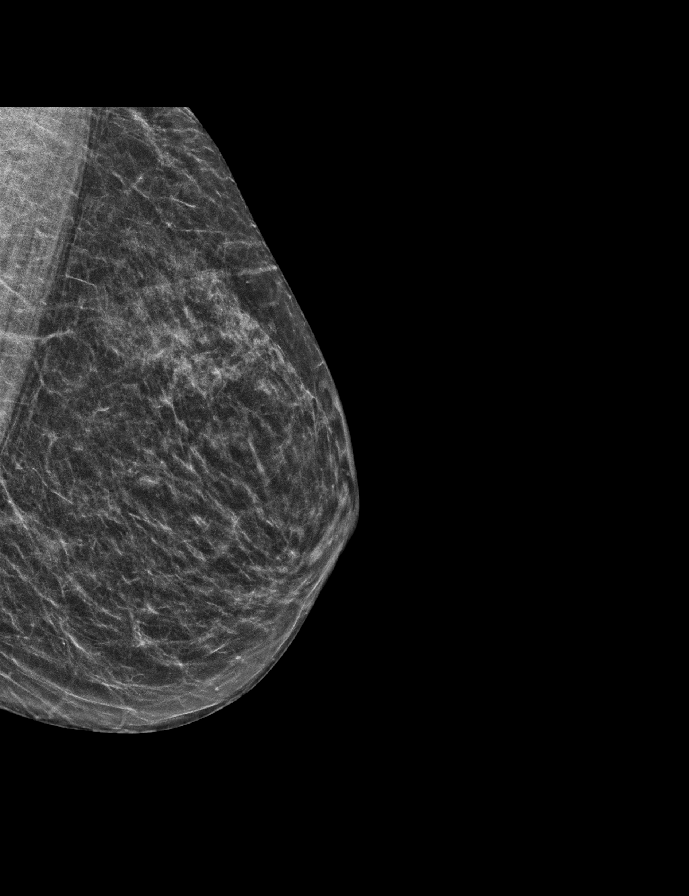

[R MLO tomo · 2 of 51 frames shown]
[frame 17/51]
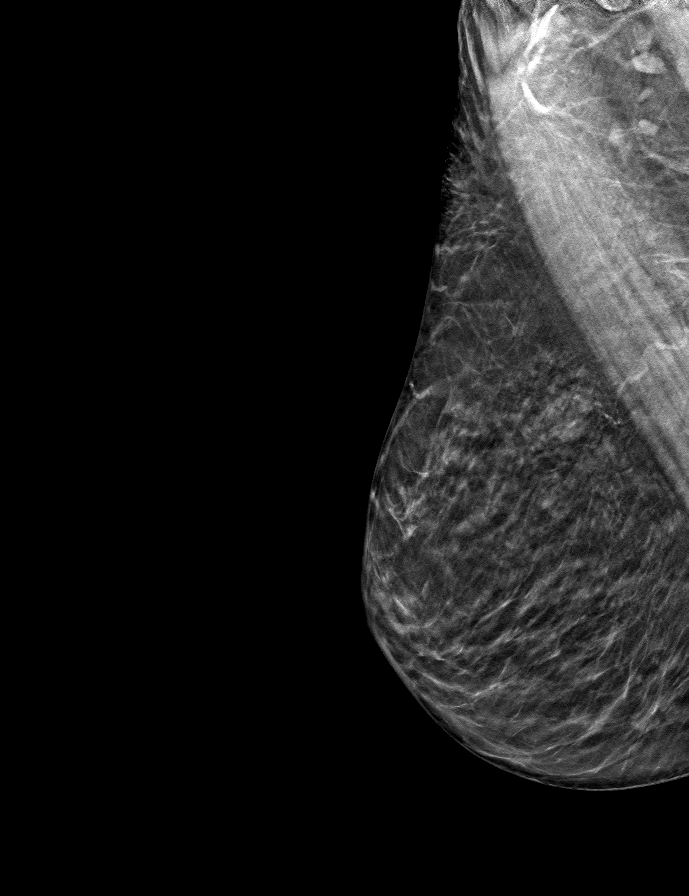
[frame 26/51]
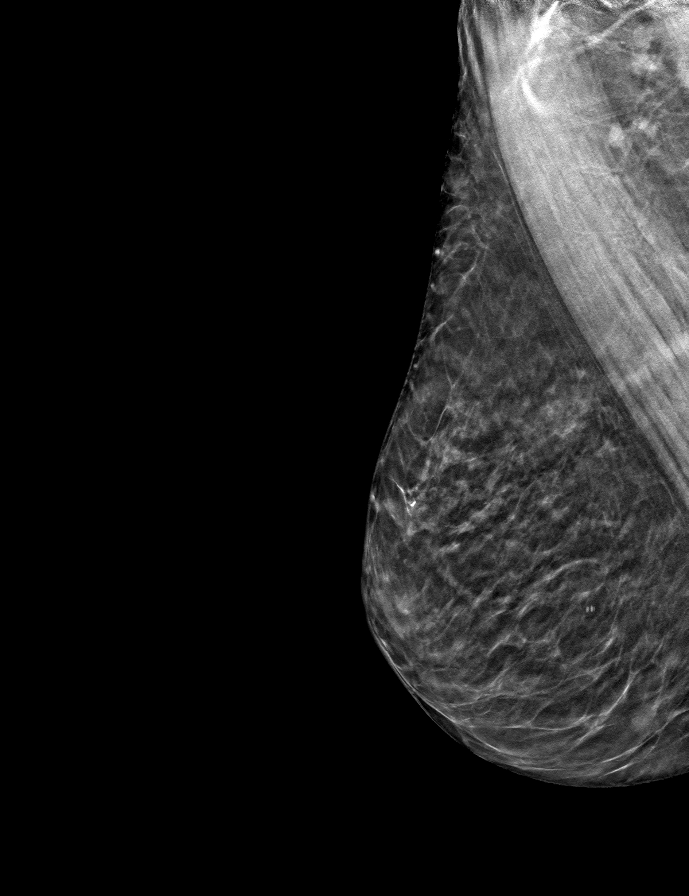

[L MLO tomo · tomo slice 23/46.0]
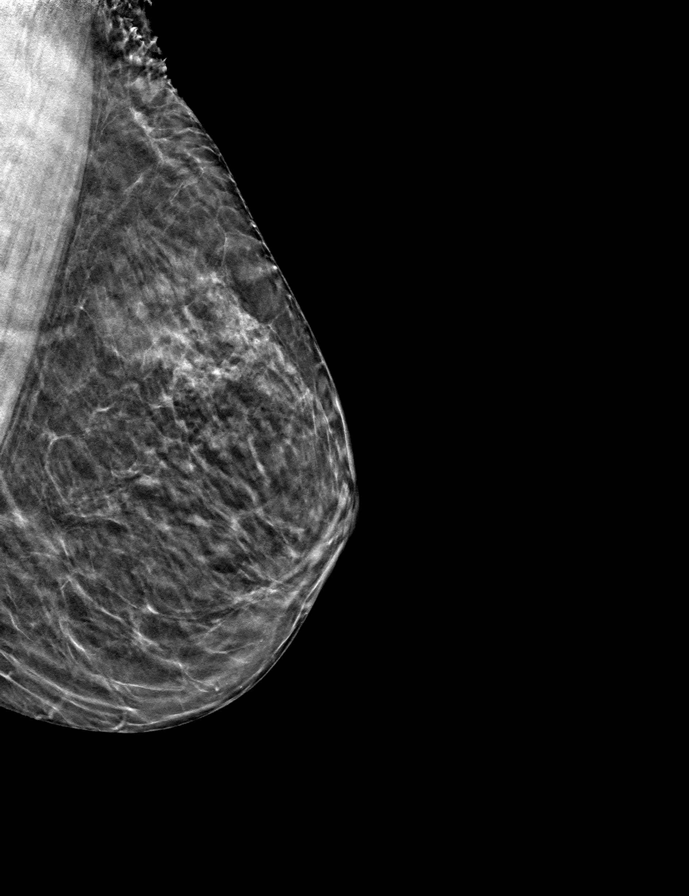

[L CC tomo · tomo slice 23/45.0]
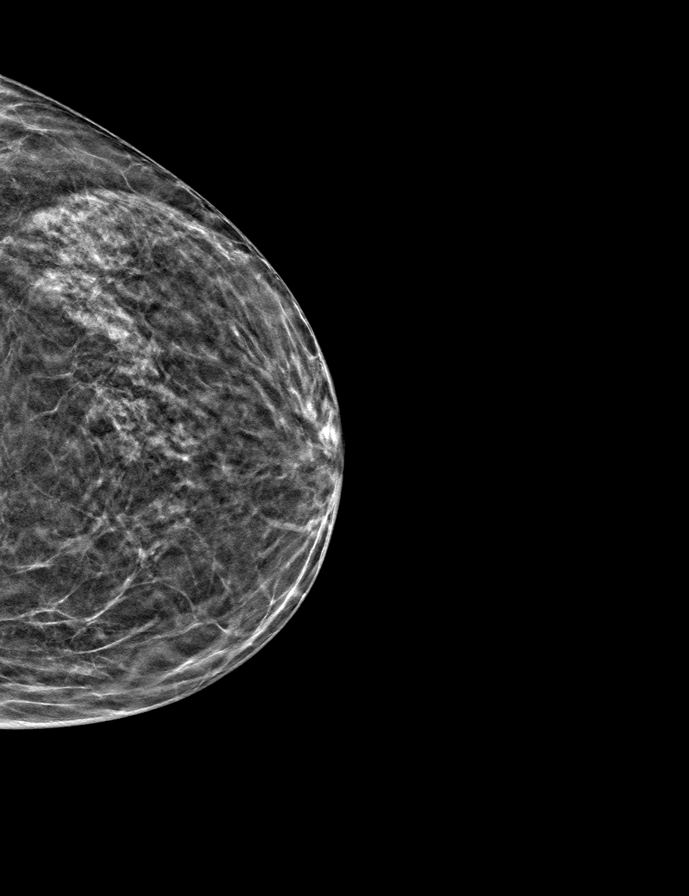

[R CC tomo · tomo slice 23/45.0]
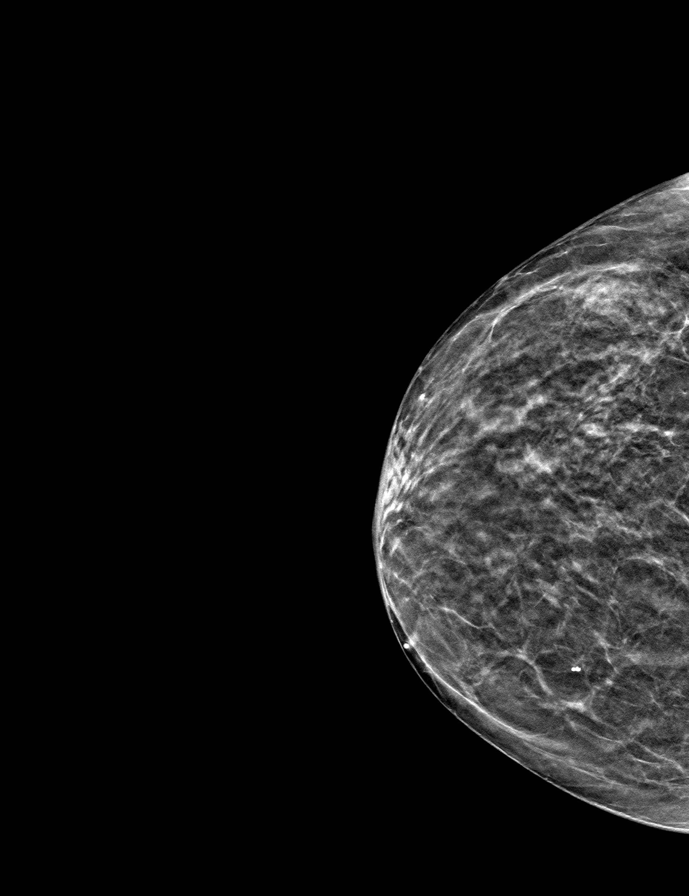

[9 of 24 positions shown; findings below may reference images not displayed]

ACR Breast Density Category b: There are scattered areas of
fibroglandular density.
FINDINGS: There are no findings suspicious for malignancy. Images were
processed with CAD.
IMPRESSION: No mammographic evidence of malignancy. A result letter of this
screening mammogram will be mailed directly to the patient.

RECOMMENDATION:
Screening mammogram in one year. (Code:[id number])

BI-RADS CATEGORY  1: Negative.

## 2020-01-10 ENCOUNTER — Ambulatory Visit: Payer: Medicare Other

## 2020-01-15 ENCOUNTER — Ambulatory Visit: Payer: Medicare Other

## 2020-01-16 ENCOUNTER — Encounter: Payer: Self-pay | Admitting: Podiatry

## 2020-01-16 ENCOUNTER — Ambulatory Visit (INDEPENDENT_AMBULATORY_CARE_PROVIDER_SITE_OTHER): Payer: Medicare Other | Admitting: Podiatry

## 2020-01-16 ENCOUNTER — Other Ambulatory Visit: Payer: Self-pay

## 2020-01-16 DIAGNOSIS — Q828 Other specified congenital malformations of skin: Secondary | ICD-10-CM | POA: Diagnosis not present

## 2020-01-16 NOTE — Progress Notes (Signed)
This patient presents to the office with chief complaint of a painful callus on the bottom ball of her right foot.  She says this is extremely painful especially when she hits the callus the wrong way.  She says this callus has been increasingly painful for the last few months.  She has provided no self treatment or sought any professional help.  Patient has a localized callus under the ball of her right big toe joint.  She presents the office today for an evaluation and treatment of this painful callus.  Vascular  Dorsalis pedis and posterior tibial pulses are palpable  B/L.  Capillary return  WNL.  Temperature gradient is  WNL.  Skin turgor  WNL  Sensorium  Senn Weinstein monofilament wire  WNL. Normal tactile sensation.  Nail Exam  Patient has normal nails with no evidence of bacterial or fungal infection.  Orthopedic  Exam  Muscle tone and muscle strength  WNL.  No limitations of motion feet  B/L.  No crepitus or joint effusion noted.  Foot type is unremarkable and digits show no abnormalities.  Bony prominences are unremarkable. Enlarged and painful tibial sesamoid right foot,  Skin  No open lesions.  Normal skin texture and turgor. Porokeratosis sub 1 right foot.  Porokeratosis right foot  Sesamoiditis right foot  IE.  Debride porokeratosis/  Padding dispensed to off weight bear 1st MPJ  Right foot.  RTC 4 months   Helane Gunther DPM

## 2020-01-25 ENCOUNTER — Other Ambulatory Visit: Payer: Self-pay | Admitting: Physician Assistant

## 2020-01-25 ENCOUNTER — Inpatient Hospital Stay (HOSPITAL_BASED_OUTPATIENT_CLINIC_OR_DEPARTMENT_OTHER): Payer: Medicare Other | Admitting: Physician Assistant

## 2020-01-25 ENCOUNTER — Inpatient Hospital Stay: Payer: Medicare Other | Attending: Hematology

## 2020-01-25 ENCOUNTER — Encounter: Payer: Self-pay | Admitting: Physician Assistant

## 2020-01-25 DIAGNOSIS — Z79899 Other long term (current) drug therapy: Secondary | ICD-10-CM | POA: Insufficient documentation

## 2020-01-25 DIAGNOSIS — N183 Chronic kidney disease, stage 3 unspecified: Secondary | ICD-10-CM | POA: Diagnosis not present

## 2020-01-25 DIAGNOSIS — F17210 Nicotine dependence, cigarettes, uncomplicated: Secondary | ICD-10-CM | POA: Insufficient documentation

## 2020-01-25 DIAGNOSIS — M545 Low back pain: Secondary | ICD-10-CM | POA: Insufficient documentation

## 2020-01-25 DIAGNOSIS — E039 Hypothyroidism, unspecified: Secondary | ICD-10-CM | POA: Insufficient documentation

## 2020-01-25 DIAGNOSIS — M3214 Glomerular disease in systemic lupus erythematosus: Secondary | ICD-10-CM | POA: Diagnosis not present

## 2020-01-25 DIAGNOSIS — G8929 Other chronic pain: Secondary | ICD-10-CM | POA: Insufficient documentation

## 2020-01-25 DIAGNOSIS — I129 Hypertensive chronic kidney disease with stage 1 through stage 4 chronic kidney disease, or unspecified chronic kidney disease: Secondary | ICD-10-CM | POA: Diagnosis not present

## 2020-01-25 DIAGNOSIS — F419 Anxiety disorder, unspecified: Secondary | ICD-10-CM | POA: Insufficient documentation

## 2020-01-25 DIAGNOSIS — D539 Nutritional anemia, unspecified: Secondary | ICD-10-CM | POA: Insufficient documentation

## 2020-01-25 LAB — CBC WITH DIFFERENTIAL (CANCER CENTER ONLY)
Abs Immature Granulocytes: 0.01 10*3/uL (ref 0.00–0.07)
Basophils Absolute: 0 10*3/uL (ref 0.0–0.1)
Basophils Relative: 0 %
Eosinophils Absolute: 0 10*3/uL (ref 0.0–0.5)
Eosinophils Relative: 1 %
HCT: 31.7 % — ABNORMAL LOW (ref 36.0–46.0)
Hemoglobin: 11 g/dL — ABNORMAL LOW (ref 12.0–15.0)
Immature Granulocytes: 0 %
Lymphocytes Relative: 30 %
Lymphs Abs: 1 10*3/uL (ref 0.7–4.0)
MCH: 36.3 pg — ABNORMAL HIGH (ref 26.0–34.0)
MCHC: 34.7 g/dL (ref 30.0–36.0)
MCV: 104.6 fL — ABNORMAL HIGH (ref 80.0–100.0)
Monocytes Absolute: 0.4 10*3/uL (ref 0.1–1.0)
Monocytes Relative: 11 %
Neutro Abs: 1.9 10*3/uL (ref 1.7–7.7)
Neutrophils Relative %: 58 %
Platelet Count: 141 10*3/uL — ABNORMAL LOW (ref 150–400)
RBC: 3.03 MIL/uL — ABNORMAL LOW (ref 3.87–5.11)
RDW: 12.3 % (ref 11.5–15.5)
WBC Count: 3.3 10*3/uL — ABNORMAL LOW (ref 4.0–10.5)
nRBC: 0 % (ref 0.0–0.2)

## 2020-01-25 LAB — FERRITIN: Ferritin: 235 ng/mL (ref 11–307)

## 2020-01-25 MED ORDER — LORAZEPAM 0.5 MG PO TABS: ORAL_TABLET | ORAL | 0 refills | Status: AC

## 2020-01-25 NOTE — Progress Notes (Signed)
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   INTERVAL HISTORY:  Catherine Harrington is here for a follow up of  Hemochromatosis. She was last 8 months ago. She presents to the clinic alone.  She is feeling well today without any concerning complaints. She denies any fatigue or generalized weakness. She denies any sign pigmentation changes besides her dermatitis that she sees her dermatologist for on her lower extremities. She denies any usual joint pain besides her chronic low back pain. She denies nausea, vomiting, diarrhea, constipation, abdominal pain, or abdominal fullness. Her last phlebotomy was about 1 year ago. She was supposed to have an MRI of the liver that was not scheduled. The patient has anxiety and is requesting an open MRI for her imaging. She drinks approximately 3 beers per night and continues to smoke. She is not interested in quitting at this time. She is here for evaluation and repeat blood work to determine if she will be requiring a therapeutic phlebotomy.   REVIEW OF SYSTEMS:   Constitutional: Denies fevers, chills or abnormal weight loss Eyes: Denies blurriness of vision Ears, nose, mouth, throat, and face: Denies mucositis or sore throat Respiratory: Denies cough, dyspnea or wheezes Cardiovascular: Denies palpitation, chest discomfort or lower extremity swelling Gastrointestinal:  Denies nausea, heartburn or change in bowel habits Skin: Positive for pruritic rash on right lower extremity (followed by dermatology) and dry skin.  Lymphatics: Denies new lymphadenopathy or easy bruising Neurological:Denies numbness, tingling or new  weaknesses Behavioral/Psych: Mood is stable, no new changes  All other systems were reviewed with the patient and are negative.  MEDICAL HISTORY:  Past Medical History:  Diagnosis Date  . Acute right flank pain 06/29/2016  . Arthritis    "related to the lupus; joints" (08/16/2013)  . Chronic renal insufficiency, stage III (moderate) 06/29/2016  . 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 Family history of anesthesia complication    "daughter doesn't wake up easy from it" (08/16/2013)  . Fibrosis of liver 04/18/2018  . 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 Hypertension   . Iron deficiency anemia   . Lumbar disc disease   . Lupus (HCC)    "just the rash and arthritis" (08/16/2013)  . Macrocytic anemia 06/29/2016  . 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 Scoliosis   . Syncope and collapse    "lost consciousness ~ 4-5 seconds; didn't hurt myself" (08/16/2013)    SURGICAL HISTORY: Past Surgical History:  Procedure Laterality Date  . CATARACT EXTRACTION Bilateral 10/19 11/19  . CESAREAN SECTION  1967; 1971  . MOUTH SURGERY  2013   " infection under bridge cleaned out, etc" (08/16/2013)  . TUBAL LIGATION      I have reviewed the social history and family history with the patient and they are unchanged from previous note.  ALLERGIES:  is allergic to prednisone; sulfonamide derivatives; epinephrine; and midol [ibuprofen].  MEDICATIONS:  Current Outpatient Medications  Medication Sig Dispense Refill  . baclofen (LIORESAL) 10 MG tablet Take 1/2 to 1 by mouth every 8hrs as needed for spasm (Patient not taking: Reported on 01/16/2020) 60 tablet 0  . calcium carbonate (OS-CAL) 600 MG TABS Take 600 mg by mouth 2 (two) times daily  with a meal.      . doxazosin (CARDURA) 8 MG tablet Take 8 mg by mouth at bedtime.      . fexofenadine (ALLEGRA) 180 MG tablet Take 180 mg by mouth daily.      Marland Kitchen gabapentin (NEURONTIN) 300 MG capsule TAKE 1 CAPSULE(300 MG) BY MOUTH AT  BEDTIME 90 capsule 3  . levothyroxine (SYNTHROID, LEVOTHROID) 50 MCG tablet Take 50 mcg by mouth daily before breakfast.    . LORazepam (ATIVAN) 0.5 MG tablet Take 1 tablet prior to phlebotomy 2 tablet 0  . losartan (COZAAR) 100 MG tablet Take 100 mg by mouth daily.      . milk thistle 175 MG tablet Take 250 mg by mouth daily.    . Multiple Vitamin (MULTIVITAMIN) tablet Take 1 tablet by mouth daily.      . Potassium Gluconate 595 MG CAPS Take by mouth.    . vitamin B-12 (CYANOCOBALAMIN) 1000 MCG tablet Take 1,000 mcg by mouth daily.     Current Facility-Administered Medications  Medication Dose Route Frequency Provider Last Rate Last Admin  . sodium chloride 0.9 % injection 250 mL  250 mL Intravenous Once Bartholomew Crews, MD        PHYSICAL EXAMINATION: ECOG PERFORMANCE STATUS: 1 - Symptomatic but completely ambulatory  Vitals:   01/25/20 1310  BP: (!) 155/66  Pulse: 87  Resp: 18  Temp: 98 F (36.7 C)  SpO2: 100%   Filed Weights   01/25/20 1310  Weight: 100 lb (45.4 kg)   GENERAL:alert, no distress and comfortable SKIN: skin color, texture, turgor are normal, no rashes or significant lesions EYES: normal, Conjunctiva are pink and non-injected, sclera clear OROPHARYNX: no exudate, no erythema and lips, buccal mucosa, and tongue normal  NECK: supple, thyroid normal size, non-tender, without nodularity LYMPH:  no palpable lymphadenopathy in the cervical, axillary. LUNGS: clear to auscultation and percussion with normal breathing effort HEART: regular rate & rhythm and no murmurs and no lower extremity edema ABDOMEN:abdomen soft, non-tender and normal bowel sounds Musculoskeletal:no cyanosis of digits and no clubbing  NEURO: alert & oriented x 3 with fluent speech, no focal motor/sensory deficits  LABORATORY DATA:  I have reviewed the data as listed CBC Latest Ref Rng & Units 01/25/2020 09/24/2019 05/24/2019  WBC 4.0 - 10.5 K/uL 3.3(L) 3.0(L) 3.5(L)  Hemoglobin 12.0 -  15.0 g/dL 11.0(L) 10.9(L) 11.3(L)  Hematocrit 36.0 - 46.0 % 31.7(L) 31.9(L) 33.5(L)  Platelets 150 - 400 K/uL 141(L) 155 211     CMP Latest Ref Rng & Units 05/24/2019 02/12/2019 10/17/2018  Glucose 70 - 99 mg/dL 88 96 102(H)  BUN 8 - 23 mg/dL 12 11 12   Creatinine 0.44 - 1.00 mg/dL 1.13(H) 1.09(H) 1.14(H)  Sodium 135 - 145 mmol/L 134(L) 137 137  Potassium 3.5 - 5.1 mmol/L 4.5 4.6 4.3  Chloride 98 - 111 mmol/L 103 102 104  CO2 22 - 32 mmol/L 20(L) 20 24  Calcium 8.9 - 10.3 mg/dL 9.3 9.9 9.4  Total Protein 6.5 - 8.1 g/dL 7.7 6.9 7.5  Total Bilirubin 0.3 - 1.2 mg/dL 0.3 0.3 1.0  Alkaline Phos 38 - 126 U/L 64 56 49  AST 15 - 41 U/L 53(H) 35 56(H)  ALT 0 - 44 U/L 24 24 32      RADIOGRAPHIC STUDIES: I have personally reviewed the radiological images as listed and agreed with the findings in the report. No results found.   ASSESSMENT & PLAN:  Arliss Hepburn is a 74 y.o. female  with    1. 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  She never had liver biopsy or liver MRI. Her MRI was ordered in June 2020 but had not been scheduled.  -The interpretation of the iron study is confounded by an underlying lupus which might lead to chronic inflammation and potentially autoimmune hepatitis, and possible coexisting liver disease from long-standing alcohol use. She was also on chronic iron supplement due to her anemia.  -Most people with C282Y heterozygous does not presented with iron overload and hemochromatosis.  To evaluate iron deposition, and decide she needs chronic phlebotomy.  Due to her longstanding mild anemia, phlebotomy is not easy on her. She is not able to afford iron chelating agent.  -Her last phlebotomy was 1 year ago. She has required Ativan with procedure.  -Labs reviewed,  CBC WNL except WBC 3.3, stable mild anemia with Hg 11.0, Ferritin 235 today, elevated from prior.  -Labs were reviewed with Dr. Mosetta Putt, given that her last phlebotomy was over 1 ear ago and some elevation in ferritin compared to prior visits, she recommends therapeutic phlebotomy with 1/2-3/4 a unit. The patient needs to be premedicated with ativan due to her anxiety and does not want to pursue this today. She would like this to be performed next week.  -I have sent a prescription of 2 tablets 0.5 mg of Ativan to her pharmacy for her upcoming MRI and phlebotomy  -Given that the patient lives locally and is requesting an open MRI, I have reordered her MRI to be at [facility]. She was provided with their number to call and schedule her scan based on her availability.   2. Chronic macrocytic anemia -Macrocytosis may be secondary to previous chronic alcohol use and hypothyroidism. Normal B12 and folic acid. Myelodysplastic syndrome not excluded. -Labs reviewed today, Hg 11.0, MCV 104.6 (01/25/20), overall stable   3. Discoid Lupus   -she was seen and managed by Dermatologist before, but not lately  -She does not currently have skin rash, not on any treatment   4. 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   5. Hypothyroid, HTN -On Levothyroxine and losartan   6. Smoking Cessation  -She smokes about 1 ppd  -I reviewed smoking cessation as this can negatively impact her overall health. She understands, but is not interested in quitting at this time.     PLAN:  -schedule therapeutic phlebotomy 1/2-3/4 unit next week.  -F/U labs and return visit in 4 months.  -Ativan 0.5 mg sent to pharmacy due to anxiety from phlebotomy.  -Open abdominal MRI reordered at [facility] and number to scheduling provided to patient.   Orders Placed This Encounter   Procedures  . MR Abdomen Wo Contrast    Please schedule open MRI    Standing Status:   Future    Standing Expiration Date:   01/24/2021    Scheduling Instructions:     Please schedule open MRI    Order Specific Question:   ** REASON FOR EXAM (FREE TEXT)    Answer:   Hemochromatosis, assess for iron deposits    Order Specific Question:   What is the patient's sedation requirement?    Answer:   No Sedation    Order  Specific Question:   Does the patient have a pacemaker or implanted devices?    Answer:   No    Order Specific Question:   Preferred imaging location?    Answer:   [facility] (table limit-550lbs)    Order Specific Question:   Radiology Contrast Protocol - do NOT remove file path    Answer:   [email or web address]  . CBC with Differential (Cancer Center Only)    Standing Status:   Future    Standing Expiration Date:   01/24/2021  . Ferritin    Standing Status:   Future    Standing Expiration Date:   01/24/2021  . Iron and TIBC    Standing Status:   Future    Standing Expiration Date:   01/24/2021      Jenny Omdahl L Naraly Fritcher, PA-C 01/25/2020

## 2020-01-28 ENCOUNTER — Telehealth: Payer: Self-pay | Admitting: Hematology

## 2020-01-28 ENCOUNTER — Other Ambulatory Visit: Payer: Self-pay | Admitting: *Deleted

## 2020-01-28 NOTE — Telephone Encounter (Signed)
Scheduled per los. Called and left msg. Mailed printout  °

## 2020-01-29 ENCOUNTER — Ambulatory Visit: Payer: Medicare Other | Attending: Internal Medicine

## 2020-01-29 DIAGNOSIS — Z23 Encounter for immunization: Secondary | ICD-10-CM | POA: Insufficient documentation

## 2020-01-29 NOTE — Progress Notes (Signed)
   Covid-19 Vaccination Clinic  Name:  Gerda Yin    MRN: [id number] DOB: [birth year]  01/29/2020  Ms. Coppin was observed post Covid-19 immunization for 15 minutes without incident. She was provided with Vaccine Information Sheet and instruction to access the V-Safe system.   Ms. Blixt was instructed to call 911 with any severe reactions post vaccine: Marland Kitchen Difficulty breathing  . Swelling of face and throat  . A fast heartbeat  . A bad rash all over body  . Dizziness and weakness   Immunizations Administered    Name Date Dose VIS Date Route   Pfizer COVID-19 Vaccine 01/29/2020 12:07 PM 0.3 mL 11/09/2019 Intramuscular   Manufacturer: [employer], [employer]   Lot: [id number]   NDC: [address]-5

## 2020-01-30 ENCOUNTER — Telehealth: Payer: Self-pay | Admitting: *Deleted

## 2020-02-01 ENCOUNTER — Other Ambulatory Visit: Payer: Self-pay

## 2020-02-01 ENCOUNTER — Other Ambulatory Visit: Payer: Self-pay | Admitting: Hematology

## 2020-02-01 ENCOUNTER — Other Ambulatory Visit: Payer: Self-pay | Admitting: Physician Assistant

## 2020-02-01 ENCOUNTER — Inpatient Hospital Stay: Payer: Medicare Other | Attending: Hematology

## 2020-02-01 MED ORDER — LIDOCAINE-PRILOCAINE 2.5-2.5 % EX CREA: 1.0000 "application " | TOPICAL_CREAM | CUTANEOUS | 0 refills | Status: AC | PRN

## 2020-02-01 NOTE — Progress Notes (Signed)
Phlebotomy performed with kit and withdrew 300 g. Patient provided food and drink. Started at 13:44 and ended at 13:49. Patient was very tearful during phlebotomy had assistance to keep patient arm still. Spoke with Dr. Burr Medico and she will enter order to use EMLA cream for next phlebotomy to numb the area ahead of time.

## 2020-02-01 NOTE — Patient Instructions (Signed)

## 2020-02-06 DIAGNOSIS — I8310 Varicose veins of unspecified lower extremity with inflammation: Secondary | ICD-10-CM | POA: Insufficient documentation

## 2020-02-06 DIAGNOSIS — M1990 Unspecified osteoarthritis, unspecified site: Secondary | ICD-10-CM | POA: Insufficient documentation

## 2020-02-13 ENCOUNTER — Other Ambulatory Visit: Payer: Self-pay | Admitting: *Deleted

## 2020-02-14 ENCOUNTER — Other Ambulatory Visit (HOSPITAL_COMMUNITY): Payer: Self-pay | Admitting: Internal Medicine

## 2020-02-14 ENCOUNTER — Other Ambulatory Visit: Payer: Self-pay

## 2020-02-14 ENCOUNTER — Ambulatory Visit (HOSPITAL_COMMUNITY)
Admission: RE | Admit: 2020-02-14 | Discharge: 2020-02-14 | Disposition: A | Discharge status: Home / Self Care | Payer: Medicare Other | Source: Ambulatory Visit | Attending: Vascular Surgery | Admitting: Vascular Surgery

## 2020-02-14 DIAGNOSIS — M79604 Pain in right leg: Secondary | ICD-10-CM | POA: Insufficient documentation

## 2020-02-14 DIAGNOSIS — M79605 Pain in left leg: Secondary | ICD-10-CM | POA: Insufficient documentation

## 2020-02-17 DIAGNOSIS — I739 Peripheral vascular disease, unspecified: Secondary | ICD-10-CM | POA: Insufficient documentation

## 2020-02-18 DIAGNOSIS — M48061 Spinal stenosis, lumbar region without neurogenic claudication: Secondary | ICD-10-CM | POA: Insufficient documentation

## 2020-02-18 DIAGNOSIS — M419 Scoliosis, unspecified: Secondary | ICD-10-CM | POA: Insufficient documentation

## 2020-02-18 NOTE — Progress Notes (Signed)
Catherine Harrington - 74 y.o. female MRN [id number]  Date of birth: 30-Jul-1946  Office Visit Note: Visit Date: 10/11/2019 PCP: Marton Redwood, MD Referred by: Levin Erp, MD  Subjective: Chief Complaint  Patient presents with  . Lower Back - Pain   HPI:  Catherine Harrington is a 74 y.o. female who comes in today For planned right L4 transforaminal epidural steroid injection.  Her case is well-known to me and you can review the notes in the chart concerning her history of discoid lupus and lumbar spine scoliosis and foraminal stenosis.  She has had history of this right mostly low back and hip and buttock pain.  She has had multiple bouts of physical therapy with dry needling and that does help to some degree.  We have completed trigger point injection at times with relief.  The biggest relief she has gotten is with spinal injection at L4 and this is done intermittently.  She has had recent exacerbation without any trauma or falls.  She also suffers from hemochromatosis and this is being controlled with phlebotomy.  Really no new issues today other than this right low back and hip pain worse with movement and standing.  We will repeat the L4 transforaminal injection today diagnostically hopefully therapeutically.  It has been quite a while since we have completed an injection.  ROS Otherwise per HPI.  Assessment & Plan: Visit Diagnoses:  1. Lumbar radiculopathy   2. Spinal stenosis of lumbar region without neurogenic claudication   3. Scoliosis of thoracolumbar spine, unspecified scoliosis type     Plan: No additional findings.   Meds & Orders:  Meds ordered this encounter  Medications  . betamethasone acetate-betamethasone sodium phosphate (CELESTONE) injection 12 mg    Orders Placed This Encounter  Procedures  . XR C-ARM NO REPORT  . Epidural Steroid injection    Follow-up: Return if symptoms worsen or fail to improve.   Procedures: No procedures performed  Lumbosacral Transforaminal  Epidural Steroid Injection - Sub-Pedicular Approach with Fluoroscopic Guidance  Patient: Catherine Harrington      Date of Birth: October 17, 1946 MRN: [id number] PCP: Marton Redwood, MD      Visit Date: 10/11/2019   Universal Protocol:    Date/Time: 10/11/2019  Consent Given By: the patient  Position: PRONE  Additional Comments: Vital signs were monitored before and after the procedure. Patient was prepped and draped in the usual sterile fashion. The correct patient, procedure, and site was verified.   Injection Procedure Details:  Procedure Site One Meds Administered:  Meds ordered this encounter  Medications  . betamethasone acetate-betamethasone sodium phosphate (CELESTONE) injection 12 mg    Laterality: Right  Location/Site:  L4-L5  Needle size: 22 G  Needle type: Spinal  Needle Placement: Transforaminal  Findings:    -Comments: Excellent flow of contrast along the nerve and into the epidural space.  Procedure Details: After squaring off the end-plates to get a true AP view, the C-arm was positioned so that an oblique view of the foramen as noted above was visualized. The target area is just inferior to the "nose of the scotty dog" or sub pedicular. The soft tissues overlying this structure were infiltrated with 2-3 ml. of 1% Lidocaine without Epinephrine.  The spinal needle was inserted toward the target using a "trajectory" view along the fluoroscope beam.  Under AP and lateral visualization, the needle was advanced so it did not puncture dura and was located close the 6 O'Clock position of the pedical in AP tracterory. Biplanar  projections were used to confirm position. Aspiration was confirmed to be negative for CSF and/or blood. A 1-2 ml. volume of Isovue-250 was injected and flow of contrast was noted at each level. Radiographs were obtained for documentation purposes.   After attaining the desired flow of contrast documented above, a 0.5 to 1.0 ml test dose of 0.25%  Marcaine was injected into each respective transforaminal space.  The patient was observed for 90 seconds post injection.  After no sensory deficits were reported, and normal lower extremity motor function was noted,   the above injectate was administered so that equal amounts of the injectate were placed at each foramen (level) into the transforaminal epidural space.   Additional Comments:  The patient tolerated the procedure well Dressing: 2 x 2 sterile gauze and Band-Aid    Post-procedure details: Patient was observed during the procedure. Post-procedure instructions were reviewed.  Patient left the clinic in stable condition.      Clinical History: MRI LUMBAR SPINE WITHOUT CONTRAST 05/09/2017  TECHNIQUE: Multiplanar, multisequence MR imaging of the lumbar spine was performed. No intravenous contrast was administered.  COMPARISON:  Lumbar spine MRI 06/11/2014  FINDINGS: Segmentation:  Normal  Alignment:  There is right convex lumbar scoliosis with apex at L3.  Vertebrae: Bone marrow signal is heterogeneous, unchanged. No evidence of discitis osteomyelitis. No acute compression fracture.  Conus medullaris: Extends to the L1 level and appears normal.  Paraspinal and other soft tissues: There is fatty atrophy of the posterior paraspinous muscles, slightly worse on the left.  Disc levels:  T9-T12: These levels are assessed on sagittal sequences only. No disc herniation, spinal canal stenosis or neural foraminal narrowing.  T12-L1: Normal disc space and facets. No spinal canal or neuroforaminal stenosis.  L1-L2: Severe disc space narrowing with minimal bulge. Moderate right and mild left neural foraminal narrowing, primarily caused by scoliotic curvature.  L2-L3: Severe disc space loss with endplate osteophytosis. No spinal canal stenosis. Moderate right and severe left neural foraminal stenosis, unchanged. Left-greater-than-right facet  arthrosis.  L3-L4: Severe disc space narrowing. No spinal canal stenosis. There is unchanged severe left foraminal stenosis. Right neural foramen is widely patent.  L4-L5: Disc space narrowing with central disc protrusion, unchanged. Severe bilateral facet hypertrophy, also unchanged. No central spinal canal stenosis. Severe right and moderate left neural foraminal stenosis, unchanged.  L5-S1: Moderate bilateral facet hypertrophy but no stenosis.  Visualized sacrum: Normal.  IMPRESSION: 1. Unchanged appearance of marked lumbar dextroscoliosis, which, in combination degenerative disc disease and facet arthrosis, that leads to severe neural foraminal narrowing at multiple levels, including L2-3, L3-4 and L4-5. These findings are unchanged, however, compared to the MRI of 06/11/2014. 2. No central spinal canal stenosis of the lumbar spine. 3. Multilevel moderate to severe facet arthrosis, which may serve as a source of localized back pain.  Lspine MRI 06/11/14 There is moderate lumbar dextroscoliosis with apex at L3. There is left lateral listhesis of L2 on L3 and right lateral listhesis of L3 on L4 and L4 on L5. Bone marrow signal is somewhat heterogeneous diffusely and overall decreased in signal in the lower thoracic spine (possibly reflecting patient's history of anemia) with prominent fatty degenerative marrow changes in the mid and lower lumbar spine. Modic type 1 degenerative marrow changes are also noted in this region. There is lateral recess narrowing at L2-3 on the right.     Objective:  VS:  HT:    WT:   BMI:     BP:124/68  HR:96bpm  TEMP: ( )  RESP:  Physical Exam  Ortho Exam Imaging: No results found.

## 2020-02-18 NOTE — Procedures (Signed)
Lumbosacral Transforaminal Epidural Steroid Injection - Sub-Pedicular Approach with Fluoroscopic Guidance  Patient: Catherine Harrington      Date of Birth: [birth year] MRN: [id number] PCP: Martha Clan, MD      Visit Date: 10/11/2019   Universal Protocol:    Date/Time: 10/11/2019  Consent Given By: the patient  Position: PRONE  Additional Comments: Vital signs were monitored before and after the procedure. Patient was prepped and draped in the usual sterile fashion. The correct patient, procedure, and site was verified.   Injection Procedure Details:  Procedure Site One Meds Administered:  Meds ordered this encounter  Medications  . betamethasone acetate-betamethasone sodium phosphate (CELESTONE) injection 12 mg    Laterality: Right  Location/Site:  L4-L5  Needle size: 22 G  Needle type: Spinal  Needle Placement: Transforaminal  Findings:    -Comments: Excellent flow of contrast along the nerve and into the epidural space.  Procedure Details: After squaring off the end-plates to get a true AP view, the C-arm was positioned so that an oblique view of the foramen as noted above was visualized. The target area is just inferior to the "nose of the scotty dog" or sub pedicular. The soft tissues overlying this structure were infiltrated with 2-3 ml. of 1% Lidocaine without Epinephrine.  The spinal needle was inserted toward the target using a "trajectory" view along the fluoroscope beam.  Under AP and lateral visualization, the needle was advanced so it did not puncture dura and was located close the 6 O'Clock position of the pedical in AP tracterory. Biplanar projections were used to confirm position. Aspiration was confirmed to be negative for CSF and/or blood. A 1-2 ml. volume of Isovue-250 was injected and flow of contrast was noted at each level. Radiographs were obtained for documentation purposes.   After attaining the desired flow of contrast documented above, a 0.5 to 1.0  ml test dose of 0.25% Marcaine was injected into each respective transforaminal space.  The patient was observed for 90 seconds post injection.  After no sensory deficits were reported, and normal lower extremity motor function was noted,   the above injectate was administered so that equal amounts of the injectate were placed at each foramen (level) into the transforaminal epidural space.   Additional Comments:  The patient tolerated the procedure well Dressing: 2 x 2 sterile gauze and Band-Aid    Post-procedure details: Patient was observed during the procedure. Post-procedure instructions were reviewed.  Patient left the clinic in stable condition.

## 2020-02-21 ENCOUNTER — Telehealth: Payer: Self-pay | Admitting: Physical Medicine and Rehabilitation

## 2020-02-21 NOTE — Telephone Encounter (Signed)
Ok to repeat, we might be ale to look at MRI abdomen

## 2020-02-21 NOTE — Telephone Encounter (Signed)
Patient states that her back has been hurting a lot. She is scheduled for an MRI Abdomen on Saturday and she wanted to make sure you wouldn't need a new MRI of her lower back. Last was 2018. No new injuries/ trauma, and she reports that her pain is the same. She would also like to schedule another injection Right L4 TF 10/11/2019. Ok to repeat?

## 2020-02-21 NOTE — Telephone Encounter (Signed)
Scheduled for 4/20 at 1300. 

## 2020-02-23 ENCOUNTER — Other Ambulatory Visit: Payer: Self-pay

## 2020-02-23 ENCOUNTER — Ambulatory Visit
Admission: RE | Admit: 2020-02-23 | Discharge: 2020-02-23 | Disposition: A | Discharge status: Home / Self Care | Payer: Medicare Other | Source: Ambulatory Visit | Attending: Physician Assistant | Admitting: Physician Assistant

## 2020-02-23 MED ORDER — GADOBENATE DIMEGLUMINE 529 MG/ML IV SOLN
9.0000 mL | Freq: Once | INTRAVENOUS | Status: AC | PRN
  Administered 2020-02-23: 9 mL via INTRAVENOUS

## 2020-02-27 DIAGNOSIS — K862 Cyst of pancreas: Secondary | ICD-10-CM | POA: Insufficient documentation

## 2020-02-28 ENCOUNTER — Telehealth: Payer: Self-pay | Admitting: *Deleted

## 2020-02-28 NOTE — Telephone Encounter (Signed)
Notified of scan results

## 2020-03-17 DIAGNOSIS — E785 Hyperlipidemia, unspecified: Secondary | ICD-10-CM | POA: Insufficient documentation

## 2020-03-17 DIAGNOSIS — F1010 Alcohol abuse, uncomplicated: Secondary | ICD-10-CM | POA: Insufficient documentation

## 2020-03-18 ENCOUNTER — Ambulatory Visit: Payer: Self-pay

## 2020-03-18 ENCOUNTER — Encounter: Payer: Self-pay | Admitting: Physical Medicine and Rehabilitation

## 2020-03-18 ENCOUNTER — Other Ambulatory Visit: Payer: Self-pay

## 2020-03-18 ENCOUNTER — Ambulatory Visit (INDEPENDENT_AMBULATORY_CARE_PROVIDER_SITE_OTHER): Payer: Medicare Other | Admitting: Physical Medicine and Rehabilitation

## 2020-03-18 VITALS — BP 121/59 | HR 89

## 2020-03-18 DIAGNOSIS — M7918 Myalgia, other site: Secondary | ICD-10-CM

## 2020-03-18 DIAGNOSIS — M419 Scoliosis, unspecified: Secondary | ICD-10-CM

## 2020-03-18 DIAGNOSIS — G8929 Other chronic pain: Secondary | ICD-10-CM | POA: Diagnosis not present

## 2020-03-18 DIAGNOSIS — M545 Low back pain: Secondary | ICD-10-CM

## 2020-03-18 DIAGNOSIS — M5416 Radiculopathy, lumbar region: Secondary | ICD-10-CM

## 2020-03-18 MED ORDER — METHYLPREDNISOLONE ACETATE 80 MG/ML IJ SUSP: 40.0000 mg | Freq: Once | INTRAMUSCULAR | Status: DC

## 2020-03-18 NOTE — Progress Notes (Signed)
 .  Numeric Pain Rating Scale and Functional Assessment Average Pain 8   In the last MONTH (on 0-10 scale) has pain interfered with the following?  1. General activity like being  able to carry out your everyday physical activities such as walking, climbing stairs, carrying groceries, or moving a chair?  Rating(8)   +Driver, -BT, -Dye Allergies.  

## 2020-03-19 ENCOUNTER — Other Ambulatory Visit (HOSPITAL_COMMUNITY): Payer: Self-pay | Admitting: Internal Medicine

## 2020-03-19 DIAGNOSIS — R0989 Other specified symptoms and signs involving the circulatory and respiratory systems: Secondary | ICD-10-CM

## 2020-03-20 ENCOUNTER — Encounter: Payer: Self-pay | Admitting: Physical Medicine and Rehabilitation

## 2020-03-20 ENCOUNTER — Ambulatory Visit (HOSPITAL_COMMUNITY)
Admission: RE | Admit: 2020-03-20 | Discharge: 2020-03-20 | Disposition: A | Discharge status: Home / Self Care | Payer: Medicare Other | Source: Ambulatory Visit | Attending: Internal Medicine | Admitting: Internal Medicine

## 2020-03-20 ENCOUNTER — Other Ambulatory Visit: Payer: Self-pay

## 2020-03-20 DIAGNOSIS — R0989 Other specified symptoms and signs involving the circulatory and respiratory systems: Secondary | ICD-10-CM | POA: Diagnosis present

## 2020-03-20 DIAGNOSIS — M7918 Myalgia, other site: Secondary | ICD-10-CM | POA: Insufficient documentation

## 2020-03-20 MED ORDER — METHYLPREDNISOLONE ACETATE 40 MG/ML IJ SUSP
40.0000 mg | INTRAMUSCULAR | Status: AC | PRN
  Administered 2020-03-18: 40 mg via INTRAMUSCULAR

## 2020-03-20 NOTE — Progress Notes (Signed)
Catherine Harrington - 74 y.o. female MRN [id number]  Date of birth: 1946/03/25  Office Visit Note: Visit Date: 03/18/2020 PCP: Martha Clan, MD Referred by: Martha Clan, MD  Subjective: Chief Complaint  Patient presents with  . Lower Back - Pain   HPI: Catherine Harrington is a 74 y.o. female who comes in today For evaluation and management of chronic worsening severe right lower back and buttock pain.  Patient is well-known to me over the last several years.  She has pretty significant degenerative scoliosis of the thoracolumbar spine.  She has fairly current MRI which is reviewed below showing right sided foraminal and lateral recess stenosis without high-grade central stenosis.  She has facet arthropathy and degenerative change.  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  She also has changed primary care providers as Dr. Chilton Si has retired.  She is seeing Dr. Clelia Croft.  She reported to him on a physical exam visit that she was having this right-sided lower back pain that had flared up over the last 6 weeks.  No specific injury.  She reports the last injection we performed which was in November did help some but it was not dramatic.  This was a right L4 transforaminal injection.  She has had physical therapy she tries to stay active.  She reports a difficult time walking any distance and standing for any length of time.  She has a lot of symptoms when she goes from flexion to extension and she most gets this sharp jolting kind of pain.  The pain she points to today is actually higher than she normally points.  Is really lower back and not buttock area.  She has no pain on the left no radicular pain no focal weakness.  Review of Systems  Constitutional: Negative for chills, fever, malaise/fatigue and weight loss.  HENT: Negative for hearing loss and sinus pain.   Eyes: Negative for blurred  vision, double vision and photophobia.  Respiratory: Negative for cough and shortness of breath.   Cardiovascular: Negative for chest pain, palpitations and leg swelling.  Gastrointestinal: Negative for abdominal pain, nausea and vomiting.  Genitourinary: Negative for flank pain.  Musculoskeletal: Positive for back pain. Negative for myalgias.  Skin: Negative for itching and rash.  Neurological: Negative for tremors, focal weakness and weakness.  Endo/Heme/Allergies: Negative.   Psychiatric/Behavioral: Negative for depression.  All other systems reviewed and are negative.  Otherwise per HPI.  Assessment & Plan: Visit Diagnoses:  1. Myofascial pain syndrome   2. Lumbar radiculopathy   3. Scoliosis of thoracolumbar spine, unspecified scoliosis type   4. Chronic right-sided low back pain without sciatica     Plan: Findings:  Chronic worsening severe right lower back pain with severe exacerbation over the last 6 to 8 weeks.  No specific trauma.  Very tight muscle between the L4 and L5-S1 region on the right with really tight paraspinal musculature and likely quadratus lumborum.  This is the area where she does have the greatest curve.  Most of her pain is reproduced with palpation here almost to the point where it makes her drop down.  I feel like this is more myofascial pain of the musculature do to the scoliosis.  We have had her have lumbar corset some different things in the past which is just not helped.  Epidural injection somewhat beneficial.  Should continue with home exercise and  activity.  Should continue with current medication which include some gabapentin and muscle relaxer.  We did provide local muscle tendon injection today along this area and it did reproduce her pain quite a bit.  We will see how she does and depending on the relief could look at epidural injection once again.    Meds & Orders:  Meds ordered this encounter  Medications  . DISCONTD: methylPREDNISolone acetate  (DEPO-MEDROL) injection 40 mg    Orders Placed This Encounter  Procedures  . Trigger Point Inj    Follow-up: Return if symptoms worsen or fail to improve.   Procedures: Paraspinal muscle and quadratus lumborum tendon injection  Date/Time: 03/18/2020 1:11 PM Performed by: Magnus Sinning, MD Authorized by: Magnus Sinning, MD   Consent Given by:  Patient Site marked: the procedure site was marked   Timeout: prior to procedure the correct patient, procedure, and site was verified   Indications:  Pain Total # of Trigger Points:  3 or more (Muscle tendon injection) Location: back   Needle Size:  25 G Approach:  Dorsal Medications #1:  40 mg methylPREDNISolone acetate 40 MG/ML Additional Injections?: No   Patient tolerance:  Patient tolerated the procedure well with no immediate complications Comments: Muscle is palpated along the lower lumbar region paraspinally which is the quadratus lumborum and multifidus.  Injection was completed without difficulty along the muscle tendon junction.    No notes on file   Clinical History: MRI LUMBAR SPINE WITHOUT CONTRAST 05/09/2017  TECHNIQUE: Multiplanar, multisequence MR imaging of the lumbar spine was performed. No intravenous contrast was administered.  COMPARISON:  Lumbar spine MRI 06/11/2014  FINDINGS: Segmentation:  Normal  Alignment:  There is right convex lumbar scoliosis with apex at L3.  Vertebrae: Bone marrow signal is heterogeneous, unchanged. No evidence of discitis osteomyelitis. No acute compression fracture.  Conus medullaris: Extends to the L1 level and appears normal.  Paraspinal and other soft tissues: There is fatty atrophy of the posterior paraspinous muscles, slightly worse on the left.  Disc levels:  T9-T12: These levels are assessed on sagittal sequences only. No disc herniation, spinal canal stenosis or neural foraminal narrowing.  T12-L1: Normal disc space and facets. No spinal canal  or neuroforaminal stenosis.  L1-L2: Severe disc space narrowing with minimal bulge. Moderate right and mild left neural foraminal narrowing, primarily caused by scoliotic curvature.  L2-L3: Severe disc space loss with endplate osteophytosis. No spinal canal stenosis. Moderate right and severe left neural foraminal stenosis, unchanged. Left-greater-than-right facet arthrosis.  L3-L4: Severe disc space narrowing. No spinal canal stenosis. There is unchanged severe left foraminal stenosis. Right neural foramen is widely patent.  L4-L5: Disc space narrowing with central disc protrusion, unchanged. Severe bilateral facet hypertrophy, also unchanged. No central spinal canal stenosis. Severe right and moderate left neural foraminal stenosis, unchanged.  L5-S1: Moderate bilateral facet hypertrophy but no stenosis.  Visualized sacrum: Normal.  IMPRESSION: 1. Unchanged appearance of marked lumbar dextroscoliosis, which, in combination degenerative disc disease and facet arthrosis, that leads to severe neural foraminal narrowing at multiple levels, including L2-3, L3-4 and L4-5. These findings are unchanged, however, compared to the MRI of 06/11/2014. 2. No central spinal canal stenosis of the lumbar spine. 3. Multilevel moderate to severe facet arthrosis, which may serve as a source of localized back pain.  Lspine MRI 06/11/14 There is moderate lumbar dextroscoliosis with apex at L3. There is left lateral listhesis of L2 on L3 and right lateral listhesis of L3 on L4 and L4 on  L5. Bone marrow signal is somewhat heterogeneous diffusely and overall decreased in signal in the lower thoracic spine (possibly reflecting patient's history of anemia) with prominent fatty degenerative marrow changes in the mid and lower lumbar spine. Modic type 1 degenerative marrow changes are also noted in this region. There is lateral recess narrowing at L2-3 on the right.   She reports that she has  been smoking cigarettes. She has a 48.00 pack-year smoking history. She has never used smokeless tobacco. No results for input(s): HGBA1C, LABURIC in the last 8760 hours.  Objective:  VS:  HT:    WT:   BMI:     BP:(!) 121/59  HR:89bpm  TEMP: ( )  RESP:  Physical Exam Vitals and nursing note reviewed.  Constitutional:      General: She is not in acute distress.    Appearance: Normal appearance. She is well-developed. She is not ill-appearing.  HENT:     Head: Normocephalic and atraumatic.  Eyes:     Conjunctiva/sclera: Conjunctivae normal.     Pupils: Pupils are equal, round, and reactive to light.  Cardiovascular:     Rate and Rhythm: Normal rate.     Pulses: Normal pulses.  Pulmonary:     Effort: Pulmonary effort is normal.  Musculoskeletal:     Right lower leg: No edema.     Left lower leg: No edema.     Comments: She ambulates without aid with a forward flexed lumbar spine and obvious scoliotic deformity to the right.  She has significant pain with palpation over the right paraspinal musculature and there is a taut band here.  This does reproduce her pain specifically.  She has no pain over the greater trochanters no pain over the buttock region.  No pain with hip rotation good distal strength  Skin:    General: Skin is warm and dry.     Findings: No erythema or rash.  Neurological:     General: No focal deficit present.     Mental Status: She is alert and oriented to person, place, and time.     Sensory: No sensory deficit.     Motor: No weakness or abnormal muscle tone.     Coordination: Coordination normal.     Gait: Gait abnormal.  Psychiatric:        Mood and Affect: Mood normal.        Behavior: Behavior normal.     Ortho Exam  Imaging: No results found.  Past Medical/Family/Surgical/Social History: Medications & Allergies reviewed per EMR, new medications updated. Patient Active Problem List   Diagnosis Date Noted  . Myofascial pain syndrome 03/20/2020    . Scoliosis of thoracolumbar spine 02/18/2020  . Spinal stenosis of lumbar region without neurogenic claudication 02/18/2020  . Porokeratosis 01/16/2020  . Fibrosis of liver 04/18/2018  . Macrocytic anemia 06/29/2016  . Chronic renal insufficiency, stage III (moderate) 06/29/2016  . Acute right flank pain 06/29/2016  . Chronic renal insufficiency 06/29/2016  . Hemochromatosis 05/18/2016  . 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 Macrocytosis 08/16/2013  . Syncope 08/16/2013  . DIVERTICULOSIS, COLON 11/04/2010   Past Medical History:  Diagnosis Date  . Acute right flank pain 06/29/2016  . Arthritis    "related to the lupus; joints" (08/16/2013)  . Chronic renal insufficiency, stage III (moderate) 06/29/2016  . 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 Family history of anesthesia complication    "daughter doesn't wake up easy from it" (08/16/2013)  .  Fibrosis of liver 04/18/2018  . 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 Hypertension   . Iron deficiency anemia   . Lumbar disc disease   . Lupus (HCC)    "just the rash and arthritis" (08/16/2013)  . Macrocytic anemia 06/29/2016  . 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 Scoliosis   . Syncope and collapse    "lost consciousness ~ 4-5 seconds; didn't hurt myself" (08/16/2013)   Family History  Problem Relation Age of Onset  . Hypertension Mother   . Hypertension Father    Past Surgical History:  Procedure Laterality Date  . CATARACT EXTRACTION Bilateral 10/19 11/19  . CESAREAN SECTION  1967; 1971  . MOUTH SURGERY  2013   " infection under bridge cleaned out, etc" (08/16/2013)  . TUBAL LIGATION     Social History   Occupational History  . Occupation: retired  Tobacco Use  . Smoking status: Current Every Day Smoker    Packs/day: 1.00    Years: 48.00    Pack years: 48.00    Types: Cigarettes  . Smokeless tobacco: Never Used  Substance and Sexual  Activity  . Alcohol use: Yes    Alcohol/week: 21.0 standard drinks    Types: 21 Cans of beer per week    Comment:  3-4 drinks daily sometimes.  . Drug use: No  . Sexual activity: Not Currently    Partners: Male    Birth control/protection: Post-menopausal

## 2020-05-07 ENCOUNTER — Telehealth: Payer: Self-pay | Admitting: Hematology

## 2020-05-07 NOTE — Telephone Encounter (Signed)
Moved appts per MD being on PAL.  Spoke with pt and she is now aware of her new appt date and time.

## 2020-05-21 ENCOUNTER — Ambulatory Visit (INDEPENDENT_AMBULATORY_CARE_PROVIDER_SITE_OTHER): Payer: Medicare Other | Admitting: Podiatry

## 2020-05-21 ENCOUNTER — Encounter: Payer: Self-pay | Admitting: Podiatry

## 2020-05-21 ENCOUNTER — Other Ambulatory Visit: Payer: Self-pay

## 2020-05-21 DIAGNOSIS — N182 Chronic kidney disease, stage 2 (mild): Secondary | ICD-10-CM | POA: Diagnosis not present

## 2020-05-21 DIAGNOSIS — Q828 Other specified congenital malformations of skin: Secondary | ICD-10-CM

## 2020-05-21 NOTE — Progress Notes (Addendum)
This patient returns to my office for at risk foot care.  This patient requires this care by a professional since this patient will be at risk due to having chronic kidney disease stage  3.  Patient says the callus are painful walking and wearing her shoes. This patient is uable to trim her callus since she cannot reach her feer due to back problems.  This patient presents for at risk foot care today.  General Appearance  Alert, conversant and in no acute stress.  Vascular  Dorsalis pedis and posterior tibial  pulses are palpable  bilaterally.  Capillary return is within normal limits  bilaterally. Temperature is within normal limits  bilaterally.  Neurologic  Senn-Weinstein monofilament wire test within normal limits  bilaterally. Muscle power within normal limits bilaterally.  Nails Thick disfigured discolored nails with subungual debris  from hallux to fifth toes bilaterally. No evidence of bacterial infection or drainage bilaterally.  Orthopedic  No limitations of motion  feet .  No crepitus or effusions noted.  No bony pathology or digital deformities noted.  Enlarged and painful tibial sesamoid right foot.  Skin  normotropic skin   noted bilaterally.  No signs of infections or ulcers noted.   Porokeratosis sub 1 right foot.  Callus left hallux.  Porokeratosis  B/L.  Sesamoiditis sub 1st MPJ right foot.  Consent was obtained for treatment procedures.   Debridement of calluses both feet with a # 15 blade.     Return office visit    3 months                  Told patient to return for periodic foot care and evaluation due to potential at risk complications.   Helane Gunther DPM

## 2020-05-23 ENCOUNTER — Other Ambulatory Visit: Payer: Medicare Other

## 2020-05-23 ENCOUNTER — Ambulatory Visit: Payer: Medicare Other | Admitting: Hematology

## 2020-05-23 ENCOUNTER — Ambulatory Visit: Payer: Medicare Other

## 2020-05-26 NOTE — Progress Notes (Signed)
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 She was last seen by me 1 year ago and seen by NP Cassie in 12/2019. She presents to the clinic alone. She notes she is doing well and stable. She notes she likes when her infusion site was numbed with spray. She never used EMLA because she did not know how to use it. She notes her abdominal hernia protruding with sneezing and coughing.  She notes paid a bill in 10/2019 from [facility] but given this should have been covered, she is trying to get her payment back.    REVIEW OF SYSTEMS:   Constitutional: Denies fevers, chills or abnormal weight loss Eyes: Denies blurriness of vision Ears, nose, mouth, throat, and face: Denies mucositis or sore throat Respiratory: Denies cough, dyspnea or wheezes Cardiovascular: Denies palpitation, chest discomfort or lower extremity swelling Gastrointestinal:  Denies nausea, heartburn or change in bowel habits Skin: Denies abnormal skin rashes Lymphatics: Denies new lymphadenopathy or easy bruising Neurological:Denies numbness, tingling or new weaknesses Behavioral/Psych: Mood is stable, no new changes  All other systems were reviewed with the patient and are negative.  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   Family history of anesthesia  complication    "daughter doesn't wake up easy from it" (08/16/2013)   Fibrosis of liver 04/18/2018   Hemochromatosis 05/18/2016   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   Hypertension    Iron deficiency anemia    Lumbar disc disease    Lupus (HCC)    "just the rash and arthritis" (08/16/2013)   Macrocytic anemia 06/29/2016   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  ALLERGIES:  is allergic to prednisone, sulfonamide derivatives, epinephrine, and midol [ibuprofen].  MEDICATIONS:  Current Outpatient Medications  Medication Sig Dispense Refill   baclofen (LIORESAL) 10 MG tablet Take 1/2 to 1 by mouth every 8hrs as needed for spasm 60 tablet 0   calcium carbonate (OS-CAL) 600 MG TABS Take 600 mg by mouth 2 (two) times daily with a meal.       CREON 24000-76000 units CPEP      dexamethasone 0.5 MG/5ML elixir SWISH 5 MLS IN THE MOUTH FOR 3 MINUTES THEN SPIT THREE TIMES DAILY FOR 3 WEEKS     doxazosin (CARDURA) 8 MG tablet Take 8 mg by mouth at bedtime.       fexofenadine (  ALLEGRA) 180 MG tablet Take 180 mg by mouth daily.       gabapentin (NEURONTIN) 300 MG capsule TAKE 1 CAPSULE(300 MG) BY MOUTH AT BEDTIME 90 capsule 3   HYDROcodone-acetaminophen (NORCO/VICODIN) 5-325 MG tablet Take 1 tablet by mouth every 4 (four) hours as needed.     levothyroxine (SYNTHROID, LEVOTHROID) 50 MCG tablet Take 50 mcg by mouth daily before breakfast.     lidocaine (XYLOCAINE) 2 % solution SMARTSIG:3-5 Milliliter(s) By Mouth  Every 4-6 Hours PRN     lidocaine-prilocaine (EMLA) cream Apply 1 application topically as needed. 30 g 0   LORazepam (ATIVAN) 0.5 MG tablet Take 1 tablet prior to phlebotomy 2 tablet 0   losartan (COZAAR) 100 MG tablet Take 100 mg by mouth daily.       milk thistle 175 MG tablet Take 250 mg by mouth daily.     Multiple Vitamin (MULTIVITAMIN) tablet Take 1 tablet by mouth daily.       Potassium Gluconate 595 MG CAPS Take by mouth.     rosuvastatin (CRESTOR) 10 MG tablet Take 10 mg by mouth at bedtime.     vitamin B-12 (CYANOCOBALAMIN) 1000 MCG tablet Take 1,000 mcg by mouth daily.     Current Facility-Administered Medications  Medication Dose Route Frequency Provider Last Rate Last Admin   sodium chloride 0.9 % injection 250 mL  250 mL Intravenous Once Bartholomew Crews, MD        PHYSICAL EXAMINATION: ECOG PERFORMANCE STATUS: 2 - Symptomatic, <50% confined to bed  Vitals:   05/29/20 1347  BP: (!) 148/62  Pulse: 81  Resp: 18  Temp: 98.1 F (36.7 C)  SpO2: 99%   Filed Weights   05/29/20 1347  Weight: 96 lb 8 oz (43.8 kg)    Due to COVID19 we will limit examination to appearance. Patient had no complaints.  GENERAL:alert, no distress and comfortable SKIN: skin color normal, no rashes or significant lesions EYES: normal, Conjunctiva are pink and non-injected, sclera clear  NEURO: alert & oriented x 3 with fluent speech   LABORATORY DATA:  I have reviewed the data as listed CBC Latest Ref Rng & Units 05/29/2020 01/25/2020 09/24/2019  WBC 4.0 - 10.5 K/uL 2.7(L) 3.3(L) 3.0(L)  Hemoglobin 12.0 - 15.0 g/dL 10.6(L) 11.0(L) 10.9(L)  Hematocrit 36 - 46 % 30.5(L) 31.7(L) 31.9(L)  Platelets 150 - 400 K/uL 130(L) 141(L) 155     CMP Latest Ref Rng & Units 05/24/2019 02/12/2019 10/17/2018  Glucose 70 - 99 mg/dL 88 96 102(H)  BUN 8 - 23 mg/dL 12 11 12   Creatinine 0.44 - 1.00 mg/dL 1.13(H) 1.09(H) 1.14(H)  Sodium 135 - 145 mmol/L 134(L) 137 137  Potassium 3.5 - 5.1 mmol/L  4.5 4.6 4.3  Chloride 98 - 111 mmol/L 103 102 104  CO2 22 - 32 mmol/L 20(L) 20 24  Calcium 8.9 - 10.3 mg/dL 9.3 9.9 9.4  Total Protein 6.5 - 8.1 g/dL 7.7 6.9 7.5  Total Bilirubin 0.3 - 1.2 mg/dL 0.3 0.3 1.0  Alkaline Phos 38 - 126 U/L 64 56 49  AST 15 - 41 U/L 53(H) 35 56(H)  ALT 0 - 44 U/L 24 24 32      RADIOGRAPHIC STUDIES: I have personally reviewed the radiological images as listed and agreed with the findings in the report. No results found.   MRI abdomen 02/23/20  IMPRESSION: 1. Mild hepatomegaly. No morphologic changes of cirrhosis. No MRI evidence of hemosiderosis in the liver or other abdominal visceral organs. No  liver masses. 2. Scattered tiny nonenhancing cystic pancreatic lesions, largest 0.3 cm, without high risk features. Follow-up MRI abdomen without and with IV contrast recommended in 2 years. This recommendation follows ACR consensus guidelines: Management of Incidental Pancreatic Cysts: A White Paper of the ACR Incidental Findings Committee. J Am Coll Radiol 2017;14:911-923.   ASSESSMENT & PLAN:  Makinlee Awwad is a 74 y.o. female with    1. 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  She never had liver biopsy.  -The interpretation of the iron study is confounded by an underlying lupus which might lead to chronic inflammation and potentially autoimmune hepatitis, and possible coexisting liver disease from long-standing alcohol use. She was also on chronic iron supplement due to her anemia. Most people with C282Y heterozygous does not presented with iron overload and hemochromatosis.  To evaluate iron deposition, and decide she needs chronic phlebotomy.  Due to her longstanding mild anemia, phlebotomy is difficult on her. She is not able to afford iron  chelating agent  -Her MRI from 02/23/20 shows mild hepatomegaly but no iron overload in liver or other organs. -She is receiving Phlebotomies as needed. Given her anemia will give half unit Phlebotomy if Ferritin above 300. I reviewed how to use EMLA cream before phlebotomies  -Labs reviewed, CBC shows WBC 2.7, Hct 30.5%, Hg 10.6, plt 130K, ANC 1.5. Iron panel still pending. No need for phlebotomy today if ferritin below 300.  -F/u in 1 year.   2. Chronic macrocytic anemia -Macrocytosis may be secondary to previous chronic alcohol use and hypothyroidism. Normal B12 and folic acid. Myelodysplastic syndrome not excluded. -Labs reviewed today, Hg 10.6, Hct 30.5%, MCV 103.4, MCH 35.9 (05/29/20). Iron panel still pending.  -She can take multivitamin without iron to help her anemia.   3. 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  -On baclofen and Gabapentin for pain, stable  -On Levothyroxine and losartan  -She smokes about 1 ppd. Cessation has been discussed and advised.   4. Possible abdominal Hernia  -Surgery was not recommended by general surgeon.    PLAN:  -MRI liver reviewed  -Will call her with ferritin level, no phlebotomy today  -Lab in 6 months -Lab and F/u with NP Lacie in 1 year  -For her billing concerns she plans to file complaint to billing. I will notify our management team about her concerns.    No problem-specific Assessment & Plan notes found for this encounter.   No orders of the defined types were placed in this encounter.  All questions were answered. The patient knows to call the clinic with any problems, questions or concerns. No barriers to learning was detected. The total time spent in the appointment was 20 minutes.     Malachy Mood, MD 05/29/2020   I, Delphina Cahill, am acting as scribe for Malachy Mood, MD.   I have reviewed the  above documentation for accuracy and completeness, and I agree with the above.

## 2020-05-29 ENCOUNTER — Inpatient Hospital Stay: Payer: Medicare Other | Attending: Hematology

## 2020-05-29 ENCOUNTER — Inpatient Hospital Stay (HOSPITAL_BASED_OUTPATIENT_CLINIC_OR_DEPARTMENT_OTHER): Payer: Medicare Other | Admitting: Hematology

## 2020-05-29 ENCOUNTER — Other Ambulatory Visit: Payer: Self-pay

## 2020-05-29 ENCOUNTER — Encounter: Payer: Self-pay | Admitting: Hematology

## 2020-05-29 ENCOUNTER — Inpatient Hospital Stay: Payer: Medicare Other

## 2020-05-29 DIAGNOSIS — N183 Chronic kidney disease, stage 3 unspecified: Secondary | ICD-10-CM | POA: Insufficient documentation

## 2020-05-29 DIAGNOSIS — D539 Nutritional anemia, unspecified: Secondary | ICD-10-CM | POA: Diagnosis not present

## 2020-05-29 DIAGNOSIS — Z79899 Other long term (current) drug therapy: Secondary | ICD-10-CM | POA: Diagnosis not present

## 2020-05-29 DIAGNOSIS — Z7952 Long term (current) use of systemic steroids: Secondary | ICD-10-CM | POA: Diagnosis not present

## 2020-05-29 DIAGNOSIS — I10 Essential (primary) hypertension: Secondary | ICD-10-CM | POA: Diagnosis not present

## 2020-05-29 DIAGNOSIS — F17210 Nicotine dependence, cigarettes, uncomplicated: Secondary | ICD-10-CM | POA: Diagnosis not present

## 2020-05-29 DIAGNOSIS — E039 Hypothyroidism, unspecified: Secondary | ICD-10-CM | POA: Diagnosis not present

## 2020-05-29 LAB — CBC WITH DIFFERENTIAL (CANCER CENTER ONLY)
Abs Immature Granulocytes: 0.01 10*3/uL (ref 0.00–0.07)
Basophils Absolute: 0 10*3/uL (ref 0.0–0.1)
Basophils Relative: 0 %
Eosinophils Absolute: 0.1 10*3/uL (ref 0.0–0.5)
Eosinophils Relative: 2 %
HCT: 30.5 % — ABNORMAL LOW (ref 36.0–46.0)
Hemoglobin: 10.6 g/dL — ABNORMAL LOW (ref 12.0–15.0)
Immature Granulocytes: 0 %
Lymphocytes Relative: 30 %
Lymphs Abs: 0.8 10*3/uL (ref 0.7–4.0)
MCH: 35.9 pg — ABNORMAL HIGH (ref 26.0–34.0)
MCHC: 34.8 g/dL (ref 30.0–36.0)
MCV: 103.4 fL — ABNORMAL HIGH (ref 80.0–100.0)
Monocytes Absolute: 0.3 10*3/uL (ref 0.1–1.0)
Monocytes Relative: 11 %
Neutro Abs: 1.5 10*3/uL — ABNORMAL LOW (ref 1.7–7.7)
Neutrophils Relative %: 57 %
Platelet Count: 130 10*3/uL — ABNORMAL LOW (ref 150–400)
RBC: 2.95 MIL/uL — ABNORMAL LOW (ref 3.87–5.11)
RDW: 12.4 % (ref 11.5–15.5)
WBC Count: 2.7 10*3/uL — ABNORMAL LOW (ref 4.0–10.5)
nRBC: 0 % (ref 0.0–0.2)

## 2020-05-29 LAB — IRON AND TIBC
Iron: 138 ug/dL (ref 41–142)
Saturation Ratios: 53 % (ref 21–57)
TIBC: 261 ug/dL (ref 236–444)
UIBC: 124 ug/dL (ref 120–384)

## 2020-05-29 LAB — FERRITIN: Ferritin: 313 ng/mL — ABNORMAL HIGH (ref 11–307)

## 2020-05-30 ENCOUNTER — Telehealth: Payer: Self-pay | Admitting: Hematology

## 2020-05-30 NOTE — Telephone Encounter (Signed)
Scheduled per 7/1 los. Unable to reach pt. Left voicemail with appt time and date. Mailing pt appt calendar.

## 2020-07-02 ENCOUNTER — Telehealth: Payer: Self-pay | Admitting: Physical Medicine and Rehabilitation

## 2020-07-02 NOTE — Telephone Encounter (Signed)
Patient called needing to schedule an appointment with Dr Alvester Morin   The number to contact patient is [phone]

## 2020-07-02 NOTE — Telephone Encounter (Signed)
Can ov with possible TP OR if last esi was helpful could do that

## 2020-07-02 NOTE — Telephone Encounter (Signed)
Please advise 

## 2020-07-03 NOTE — Telephone Encounter (Signed)
Scheduled

## 2020-07-21 ENCOUNTER — Ambulatory Visit (INDEPENDENT_AMBULATORY_CARE_PROVIDER_SITE_OTHER): Payer: Medicare Other | Admitting: Physical Medicine and Rehabilitation

## 2020-07-21 ENCOUNTER — Other Ambulatory Visit: Payer: Self-pay

## 2020-07-21 VITALS — BP 117/65 | HR 84

## 2020-07-21 DIAGNOSIS — M25551 Pain in right hip: Secondary | ICD-10-CM | POA: Diagnosis not present

## 2020-07-21 DIAGNOSIS — G894 Chronic pain syndrome: Secondary | ICD-10-CM

## 2020-07-21 DIAGNOSIS — M48061 Spinal stenosis, lumbar region without neurogenic claudication: Secondary | ICD-10-CM

## 2020-07-21 DIAGNOSIS — M7918 Myalgia, other site: Secondary | ICD-10-CM | POA: Diagnosis not present

## 2020-07-21 DIAGNOSIS — M419 Scoliosis, unspecified: Secondary | ICD-10-CM | POA: Diagnosis not present

## 2020-07-21 NOTE — Progress Notes (Signed)
    Numeric Pain Rating Scale and Functional Assessment Average Pain (7)   In the last MONTH (on 0-10 scale) has pain interfered with the following?  1. General activity like being  able to carry out your everyday physical activities such as walking, climbing stairs, carrying groceries, or moving a chair?  Rating(10)   +Driver, -BT, -Dye Allergies.   

## 2020-07-22 ENCOUNTER — Encounter: Payer: Self-pay | Admitting: Physical Medicine and Rehabilitation

## 2020-07-22 MED ORDER — LIDOCAINE HCL (PF) 1 % IJ SOLN
3.0000 mL | INTRAMUSCULAR | Status: AC | PRN
  Administered 2020-07-21: 3 mL

## 2020-07-22 MED ORDER — METHYLPREDNISOLONE ACETATE 40 MG/ML IJ SUSP
40.0000 mg | INTRAMUSCULAR | Status: AC | PRN
  Administered 2020-07-21: 40 mg via INTRAMUSCULAR

## 2020-07-22 NOTE — Progress Notes (Signed)
Catherine Harrington - 74 y.o. female MRN [id number]  Date of birth: 25-Feb-1946  Office Visit Note: Visit Date: 07/21/2020 PCP: Martha Clan, MD Referred by: Martha Clan, MD  Subjective: Chief Complaint  Patient presents with  . Lower Back - Pain   HPI: Catherine Harrington is a 74 y.o. female who comes in today For evaluation management of chronic worsening low back pain and some upper back pain with some referral or pain into the right buttock and hip area.  The last time I saw the patient was in April of this year and we completed local trigger point/muscle injection into the right lower back with at least relief of symptoms to a degree for a few months.  She has a hard time today from a historical standpoint as she just reveals that she is just in a lot of pain and just really wants her pain to go away.  Her case is very complicated and notes can be reviewed.  She has history of discoid lupus as well as hemochromatosis and pretty significant lumbar scoliosis.  The scoliosis causes foraminal narrowing both left and right without much in the way of central canal stenosis.  She continues to take some hydrocodone from her primary care physician as well as gabapentin at night and some baclofen all without much relief in general.  She has had multiple bouts of physical therapy.  She does use a TENS unit and she asked today about getting prescription for a new unit as the battery on hers has gone out.  She had obtained this through [employer], DPT at [facility] physical therapy.  I did write a prescription for this but also suggested they could look at TENS units from [facility].  She rates her pain as a 7 out of 10 but functionally it causes her not be able to do a lot of things she would like to do.  She is very frail individual very thin.  She does get some referral at times into the leg but it is mainly in the right buttock and lateral hip over the piriformis or gluteus medius area.  There is painful to  touch and when she sits on it.  She has no focal weakness.  She has had no new trauma or falls.  No other red flag symptoms or complaints.  Did have a fairly recent MRI of the abdomen and I was able to review the axial images through the lumbar spine and this does not show much in the way of central stenosis but again multilevel facet arthropathy multilevel degenerative disc height loss and foraminal narrowing.  Review of Systems  Constitutional: Positive for malaise/fatigue.  Musculoskeletal: Positive for back pain and joint pain.  Neurological: Positive for weakness.  All other systems reviewed and are negative.  Otherwise per HPI.  Assessment & Plan: Visit Diagnoses:  1. Pain in right hip   2. Scoliosis of thoracolumbar spine, unspecified scoliosis type   3. Myofascial pain syndrome   4. Foraminal stenosis of lumbar region   5. Chronic pain syndrome     Plan: Findings:  Severe worsening and really limiting axial low back pain worse into the right buttock and lateral hip sometimes in the leg.  Pain is multifactorial given history of lumbar scoliosis and lumbar stenosis and arthropathy.  Referral pattern could be from the spine into the buttock area which has not happened to her in the past but she is also done well with localized injection over the piriformis area.  Could also be gluteus medius.  She does not have much in the way of padding at her body weight with sitting on this.  She does spend a lot of time sitting but does try to stay active.  Today we provided prescription for TENS unit but also had her husband who was present today to look at [facility] for TENS unit.  She does report this helps to a degree.  She will continue on current medication.  We provided localized injection today into the piriformis muscle and gluteus medius tendon.  Depending on relief for that would look at right-sided L4 transforaminal epidural steroid injection.  For her back pain not so much the buttock pain I  would recommend at some point potentially looking at diagnostic medial branch blocks and potential for ablation.  We have looked at this in the past.    Meds & Orders: No orders of the defined types were placed in this encounter.   Orders Placed This Encounter  Procedures  . Trigger Point Inj    Follow-up: Return if symptoms worsen or fail to improve.   Procedures: Piriformis muscle injection  Date/Time: 07/21/2020 1:10 PM Performed by: Tyrell Antonio, MD Authorized by: Tyrell Antonio, MD   Consent Given by:  Patient Site marked: the procedure site was marked   Timeout: prior to procedure the correct patient, procedure, and site was verified   Indications:  Pain and therapeutic Total # of Trigger Points:  1 Location: back   Needle Size:  25 G Approach:  Dorsal and lateral Medications #1:  40 mg methylPREDNISolone acetate 40 MG/ML; 3 mL lidocaine (PF) 1 % Patient tolerance:  Patient tolerated the procedure well with no immediate complications Comments: Painful area palpated along the piriformis and gluteus medius muscle tendon insertion to the trochanter.    No notes on file   Clinical History: MRI LUMBAR SPINE WITHOUT CONTRAST 05/09/2017  TECHNIQUE: Multiplanar, multisequence MR imaging of the lumbar spine was performed. No intravenous contrast was administered.  COMPARISON:  Lumbar spine MRI 06/11/2014  FINDINGS: Segmentation:  Normal  Alignment:  There is right convex lumbar scoliosis with apex at L3.  Vertebrae: Bone marrow signal is heterogeneous, unchanged. No evidence of discitis osteomyelitis. No acute compression fracture.  Conus medullaris: Extends to the L1 level and appears normal.  Paraspinal and other soft tissues: There is fatty atrophy of the posterior paraspinous muscles, slightly worse on the left.  Disc levels:  T9-T12: These levels are assessed on sagittal sequences only. No disc herniation, spinal canal stenosis or neural  foraminal narrowing.  T12-L1: Normal disc space and facets. No spinal canal or neuroforaminal stenosis.  L1-L2: Severe disc space narrowing with minimal bulge. Moderate right and mild left neural foraminal narrowing, primarily caused by scoliotic curvature.  L2-L3: Severe disc space loss with endplate osteophytosis. No spinal canal stenosis. Moderate right and severe left neural foraminal stenosis, unchanged. Left-greater-than-right facet arthrosis.  L3-L4: Severe disc space narrowing. No spinal canal stenosis. There is unchanged severe left foraminal stenosis. Right neural foramen is widely patent.  L4-L5: Disc space narrowing with central disc protrusion, unchanged. Severe bilateral facet hypertrophy, also unchanged. No central spinal canal stenosis. Severe right and moderate left neural foraminal stenosis, unchanged.  L5-S1: Moderate bilateral facet hypertrophy but no stenosis.  Visualized sacrum: Normal.  IMPRESSION: 1. Unchanged appearance of marked lumbar dextroscoliosis, which, in combination degenerative disc disease and facet arthrosis, that leads to severe neural foraminal narrowing at multiple levels, including L2-3, L3-4 and L4-5. These findings  are unchanged, however, compared to the MRI of 06/11/2014. 2. No central spinal canal stenosis of the lumbar spine. 3. Multilevel moderate to severe facet arthrosis, which may serve as a source of localized back pain.  Lspine MRI 06/11/14 There is moderate lumbar dextroscoliosis with apex at L3. There is left lateral listhesis of L2 on L3 and right lateral listhesis of L3 on L4 and L4 on L5. Bone marrow signal is somewhat heterogeneous diffusely and overall decreased in signal in the lower thoracic spine (possibly reflecting patient's history of anemia) with prominent fatty degenerative marrow changes in the mid and lower lumbar spine. Modic type 1 degenerative marrow changes are also noted in this region.  There is lateral recess narrowing at L2-3 on the right.   She reports that she has been smoking cigarettes. She has a 48.00 pack-year smoking history. She has never used smokeless tobacco. No results for input(s): HGBA1C, LABURIC in the last 8760 hours.  Objective:  VS:  HT:    WT:   BMI:     BP:117/65  HR:84bpm  TEMP: ( )  RESP:  Physical Exam Vitals and nursing note reviewed.  Constitutional:      General: She is not in acute distress.    Appearance: Normal appearance. She is well-developed. She is not ill-appearing.     Comments: Very thin  HENT:     Head: Normocephalic and atraumatic.     Right Ear: External ear normal.     Left Ear: External ear normal.  Eyes:     Conjunctiva/sclera: Conjunctivae normal.     Pupils: Pupils are equal, round, and reactive to light.  Cardiovascular:     Rate and Rhythm: Normal rate.     Pulses: Normal pulses.  Pulmonary:     Effort: Pulmonary effort is normal.  Musculoskeletal:     Right lower leg: No edema.     Left lower leg: No edema.     Comments: Patient is very slow to rise from a seated position.  She stands with a forward flexed lumbar spine.  There is obvious scoliotic deformity to the right of the lumbar spine.  She has concordant myofascial trigger points really throughout the lumbar paraspinal musculature as well as the gluteus medius and piriformis on the right.  No pain over the greater trochanter.  No pain with hip rotation.  She has good distal strength.  She does ambulate very slowly and unsteadily.  Skin:    General: Skin is warm and dry.     Findings: No erythema or rash.  Neurological:     General: No focal deficit present.     Mental Status: She is alert and oriented to person, place, and time.     Sensory: No sensory deficit.     Motor: No abnormal muscle tone.     Coordination: Coordination normal.     Gait: Gait abnormal.  Psychiatric:        Mood and Affect: Mood normal.        Behavior: Behavior normal.       Ortho Exam  Imaging: No results found.  Past Medical/Family/Surgical/Social History: Medications & Allergies reviewed per EMR, new medications updated. Patient Active Problem List   Diagnosis Date Noted  . Myofascial pain syndrome 03/20/2020  . Scoliosis of thoracolumbar spine 02/18/2020  . Spinal stenosis of lumbar region without neurogenic claudication 02/18/2020  . Porokeratosis 01/16/2020  . Fibrosis of liver 04/18/2018  . Macrocytic anemia 06/29/2016  . Chronic renal insufficiency, stage  III (moderate) 06/29/2016  . Acute right flank pain 06/29/2016  . Chronic renal insufficiency 06/29/2016  . Hemochromatosis 05/18/2016  . 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 Macrocytosis 08/16/2013  . Syncope 08/16/2013  . DIVERTICULOSIS, COLON 11/04/2010   Past Medical History:  Diagnosis Date  . Acute right flank pain 06/29/2016  . Arthritis    "related to the lupus; joints" (08/16/2013)  . Chronic renal insufficiency, stage III (moderate) 06/29/2016  . 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 Family history of anesthesia complication    "daughter doesn't wake up easy from it" (08/16/2013)  . Fibrosis of liver 04/18/2018  . 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 Hypertension   . Iron deficiency anemia   . Lumbar disc disease   . Lupus (HCC)    "just the rash and arthritis" (08/16/2013)  . Macrocytic anemia 06/29/2016  . 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 Scoliosis   . Syncope and collapse    "lost consciousness ~ 4-5 seconds; didn't hurt myself" (08/16/2013)   Family History  Problem Relation Age of Onset  . Hypertension Mother   . Hypertension Father    Past Surgical History:  Procedure Laterality Date  . CATARACT EXTRACTION Bilateral 10/19 11/19  . CESAREAN SECTION  1967; 1971  . MOUTH SURGERY  2013   " infection under bridge cleaned out, etc" (08/16/2013)  . TUBAL LIGATION     Social  History   Occupational History  . Occupation: retired  Tobacco Use  . Smoking status: Current Every Day Smoker    Packs/day: 1.00    Years: 48.00    Pack years: 48.00    Types: Cigarettes  . Smokeless tobacco: Never Used  Substance and Sexual Activity  . Alcohol use: Yes    Alcohol/week: 21.0 standard drinks    Types: 21 Cans of beer per week    Comment:  3-4 drinks daily sometimes.  . Drug use: No  . Sexual activity: Not Currently    Partners: Male    Birth control/protection: Post-menopausal

## 2020-08-01 ENCOUNTER — Telehealth: Payer: Self-pay | Admitting: Physical Medicine and Rehabilitation

## 2020-08-01 NOTE — Telephone Encounter (Signed)
Patient called.   Requesting a call back to schedule an appointment.   Call back: [phone]

## 2020-08-06 ENCOUNTER — Other Ambulatory Visit: Payer: Self-pay | Admitting: Physical Medicine and Rehabilitation

## 2020-08-06 DIAGNOSIS — F411 Generalized anxiety disorder: Secondary | ICD-10-CM

## 2020-08-06 MED ORDER — DIAZEPAM 5 MG PO TABS: ORAL_TABLET | ORAL | 0 refills | Status: DC

## 2020-08-06 NOTE — Telephone Encounter (Signed)
Done

## 2020-08-06 NOTE — Progress Notes (Signed)
Pre-procedure diazepam ordered for pre-operative anxiety.  

## 2020-08-06 NOTE — Telephone Encounter (Signed)
OV vs injection?

## 2020-08-06 NOTE — Telephone Encounter (Signed)
Patient is scheduled for tomorrow at 1400. She would like Valium prior. Pharmacy is correct.

## 2020-08-06 NOTE — Telephone Encounter (Signed)
Right L4 tf esi

## 2020-08-07 ENCOUNTER — Ambulatory Visit (INDEPENDENT_AMBULATORY_CARE_PROVIDER_SITE_OTHER): Payer: Medicare Other | Admitting: Physical Medicine and Rehabilitation

## 2020-08-07 ENCOUNTER — Encounter: Payer: Self-pay | Admitting: Physical Medicine and Rehabilitation

## 2020-08-07 ENCOUNTER — Ambulatory Visit: Payer: Self-pay

## 2020-08-07 ENCOUNTER — Other Ambulatory Visit: Payer: Self-pay

## 2020-08-07 VITALS — BP 101/61 | HR 98

## 2020-08-07 DIAGNOSIS — M5416 Radiculopathy, lumbar region: Secondary | ICD-10-CM

## 2020-08-07 DIAGNOSIS — M48061 Spinal stenosis, lumbar region without neurogenic claudication: Secondary | ICD-10-CM

## 2020-08-07 MED ORDER — METHYLPREDNISOLONE ACETATE 80 MG/ML IJ SUSP
80.0000 mg | Freq: Once | INTRAMUSCULAR | Status: AC
  Administered 2020-08-07: 80 mg

## 2020-08-07 NOTE — Progress Notes (Signed)
Pt state lower back pain. Pt state standing and trying to get dress makes the pain worse. Pt state Pain pill helps with pain.  Numeric Pain Rating Scale and Functional Assessment Average Pain 8   In the last MONTH (on 0-10 scale) has pain interfered with the following?  1. General activity like being  able to carry out your everyday physical activities such as walking, climbing stairs, carrying groceries, or moving a chair?  Rating(5)   +Driver, -BT, -Dye Allergies.

## 2020-08-13 NOTE — Procedures (Signed)
Lumbosacral Transforaminal Epidural Steroid Injection - Sub-Pedicular Approach with Fluoroscopic Guidance  Patient: Catherine Harrington      Date of Birth: October 23, 1946 MRN: [id number] PCP: Martha Clan, MD      Visit Date: 08/07/2020   Universal Protocol:    Date/Time: 08/07/2020  Consent Given By: the patient  Position: PRONE  Additional Comments: Vital signs were monitored before and after the procedure. Patient was prepped and draped in the usual sterile fashion. The correct patient, procedure, and site was verified.   Injection Procedure Details:  Procedure Site One Meds Administered:  Meds ordered this encounter  Medications  . methylPREDNISolone acetate (DEPO-MEDROL) injection 80 mg    Laterality: Right  Location/Site:  L4-L5  Needle size: 22 G  Needle type: Spinal  Needle Placement: Transforaminal  Findings:    -Comments: Excellent flow of contrast along the nerve, nerve root and into the epidural space.  Procedure Details: After squaring off the end-plates to get a true AP view, the C-arm was positioned so that an oblique view of the foramen as noted above was visualized. The target area is just inferior to the "nose of the scotty dog" or sub pedicular. The soft tissues overlying this structure were infiltrated with 2-3 ml. of 1% Lidocaine without Epinephrine.  The spinal needle was inserted toward the target using a "trajectory" view along the fluoroscope beam.  Under AP and lateral visualization, the needle was advanced so it did not puncture dura and was located close the 6 O'Clock position of the pedical in AP tracterory. Biplanar projections were used to confirm position. Aspiration was confirmed to be negative for CSF and/or blood. A 1-2 ml. volume of Isovue-250 was injected and flow of contrast was noted at each level. Radiographs were obtained for documentation purposes.   After attaining the desired flow of contrast documented above, a 0.5 to 1.0 ml test dose  of 0.25% Marcaine was injected into each respective transforaminal space.  The patient was observed for 90 seconds post injection.  After no sensory deficits were reported, and normal lower extremity motor function was noted,   the above injectate was administered so that equal amounts of the injectate were placed at each foramen (level) into the transforaminal epidural space.   Additional Comments:  The patient tolerated the procedure well Dressing: 2 x 2 sterile gauze and Band-Aid    Post-procedure details: Patient was observed during the procedure. Post-procedure instructions were reviewed.  Patient left the clinic in stable condition.

## 2020-08-13 NOTE — Progress Notes (Signed)
Catherine Harrington - 74 y.o. female MRN [id number]  Date of birth: 1945-12-08  Office Visit Note: Visit Date: 08/07/2020 PCP: Martha Clan, MD Referred by: Martha Clan, MD  Subjective: No chief complaint on file.  HPI:  Catherine Harrington is a 74 y.o. female who comes in today at the request of Dr. Naaman Plummer for planned Right L4-L5 Lumbar epidural steroid injection with fluoroscopic guidance.  The patient has failed conservative care including home exercise, medications, time and activity modification.  This injection will be diagnostic and hopefully therapeutic.  Please see requesting physician notes for further details and justification.   ROS Otherwise per HPI.  Assessment & Plan: Visit Diagnoses:  1. Lumbar radiculopathy   2. Foraminal stenosis of lumbar region     Plan: No additional findings.   Meds & Orders:  Meds ordered this encounter  Medications  . methylPREDNISolone acetate (DEPO-MEDROL) injection 80 mg    Orders Placed This Encounter  Procedures  . XR C-ARM NO REPORT  . Epidural Steroid injection    Follow-up: Return if symptoms worsen or fail to improve.   Procedures: No procedures performed  Lumbosacral Transforaminal Epidural Steroid Injection - Sub-Pedicular Approach with Fluoroscopic Guidance  Patient: Catherine Harrington      Date of Birth: [birth year] MRN: [id number] PCP: Martha Clan, MD      Visit Date: 08/07/2020   Universal Protocol:    Date/Time: 08/07/2020  Consent Given By: the patient  Position: PRONE  Additional Comments: Vital signs were monitored before and after the procedure. Patient was prepped and draped in the usual sterile fashion. The correct patient, procedure, and site was verified.   Injection Procedure Details:  Procedure Site One Meds Administered:  Meds ordered this encounter  Medications  . methylPREDNISolone acetate (DEPO-MEDROL) injection 80 mg    Laterality: Right  Location/Site:  L4-L5  Needle size: 22  G  Needle type: Spinal  Needle Placement: Transforaminal  Findings:    -Comments: Excellent flow of contrast along the nerve, nerve root and into the epidural space.  Procedure Details: After squaring off the end-plates to get a true AP view, the C-arm was positioned so that an oblique view of the foramen as noted above was visualized. The target area is just inferior to the "nose of the scotty dog" or sub pedicular. The soft tissues overlying this structure were infiltrated with 2-3 ml. of 1% Lidocaine without Epinephrine.  The spinal needle was inserted toward the target using a "trajectory" view along the fluoroscope beam.  Under AP and lateral visualization, the needle was advanced so it did not puncture dura and was located close the 6 O'Clock position of the pedical in AP tracterory. Biplanar projections were used to confirm position. Aspiration was confirmed to be negative for CSF and/or blood. A 1-2 ml. volume of Isovue-250 was injected and flow of contrast was noted at each level. Radiographs were obtained for documentation purposes.   After attaining the desired flow of contrast documented above, a 0.5 to 1.0 ml test dose of 0.25% Marcaine was injected into each respective transforaminal space.  The patient was observed for 90 seconds post injection.  After no sensory deficits were reported, and normal lower extremity motor function was noted,   the above injectate was administered so that equal amounts of the injectate were placed at each foramen (level) into the transforaminal epidural space.   Additional Comments:  The patient tolerated the procedure well Dressing: 2 x 2 sterile gauze and Band-Aid  Post-procedure details: Patient was observed during the procedure. Post-procedure instructions were reviewed.  Patient left the clinic in stable condition.      Clinical History: MRI LUMBAR SPINE WITHOUT CONTRAST 05/09/2017  TECHNIQUE: Multiplanar, multisequence MR  imaging of the lumbar spine was performed. No intravenous contrast was administered.  COMPARISON:  Lumbar spine MRI 06/11/2014  FINDINGS: Segmentation:  Normal  Alignment:  There is right convex lumbar scoliosis with apex at L3.  Vertebrae: Bone marrow signal is heterogeneous, unchanged. No evidence of discitis osteomyelitis. No acute compression fracture.  Conus medullaris: Extends to the L1 level and appears normal.  Paraspinal and other soft tissues: There is fatty atrophy of the posterior paraspinous muscles, slightly worse on the left.  Disc levels:  T9-T12: These levels are assessed on sagittal sequences only. No disc herniation, spinal canal stenosis or neural foraminal narrowing.  T12-L1: Normal disc space and facets. No spinal canal or neuroforaminal stenosis.  L1-L2: Severe disc space narrowing with minimal bulge. Moderate right and mild left neural foraminal narrowing, primarily caused by scoliotic curvature.  L2-L3: Severe disc space loss with endplate osteophytosis. No spinal canal stenosis. Moderate right and severe left neural foraminal stenosis, unchanged. Left-greater-than-right facet arthrosis.  L3-L4: Severe disc space narrowing. No spinal canal stenosis. There is unchanged severe left foraminal stenosis. Right neural foramen is widely patent.  L4-L5: Disc space narrowing with central disc protrusion, unchanged. Severe bilateral facet hypertrophy, also unchanged. No central spinal canal stenosis. Severe right and moderate left neural foraminal stenosis, unchanged.  L5-S1: Moderate bilateral facet hypertrophy but no stenosis.  Visualized sacrum: Normal.  IMPRESSION: 1. Unchanged appearance of marked lumbar dextroscoliosis, which, in combination degenerative disc disease and facet arthrosis, that leads to severe neural foraminal narrowing at multiple levels, including L2-3, L3-4 and L4-5. These findings are unchanged, however,  compared to the MRI of 06/11/2014. 2. No central spinal canal stenosis of the lumbar spine. 3. Multilevel moderate to severe facet arthrosis, which may serve as a source of localized back pain.  Lspine MRI 06/11/14 There is moderate lumbar dextroscoliosis with apex at L3. There is left lateral listhesis of L2 on L3 and right lateral listhesis of L3 on L4 and L4 on L5. Bone marrow signal is somewhat heterogeneous diffusely and overall decreased in signal in the lower thoracic spine (possibly reflecting patient's history of anemia) with prominent fatty degenerative marrow changes in the mid and lower lumbar spine. Modic type 1 degenerative marrow changes are also noted in this region. There is lateral recess narrowing at L2-3 on the right.     Objective:  VS:  HT:    WT:   BMI:     BP:101/61  HR:98bpm  TEMP: ( )  RESP:  Physical Exam Constitutional:      General: She is not in acute distress.    Appearance: Normal appearance. She is not ill-appearing.     Comments: Very thin appearing  HENT:     Head: Normocephalic and atraumatic.     Right Ear: External ear normal.     Left Ear: External ear normal.  Eyes:     Extraocular Movements: Extraocular movements intact.  Cardiovascular:     Rate and Rhythm: Normal rate.     Pulses: Normal pulses.  Musculoskeletal:     Right lower leg: No edema.     Left lower leg: No edema.     Comments: Patient has good distal strength she has some pain over the right greater trochanter and PSIS.  No  clonus or focal weakness.  She has increased kyphosis of the thoracic spine.  She has obvious scoliotic deformity when she stands.  Skin:    Findings: No erythema, lesion or rash.  Neurological:     General: No focal deficit present.     Mental Status: She is alert and oriented to person, place, and time.     Sensory: No sensory deficit.     Motor: No weakness or abnormal muscle tone.     Coordination: Coordination normal.  Psychiatric:         Mood and Affect: Mood normal.        Behavior: Behavior normal.      Imaging: No results found.

## 2020-08-15 ENCOUNTER — Telehealth: Payer: Self-pay | Admitting: Physical Medicine and Rehabilitation

## 2020-08-15 DIAGNOSIS — M545 Low back pain, unspecified: Secondary | ICD-10-CM

## 2020-08-15 DIAGNOSIS — G8929 Other chronic pain: Secondary | ICD-10-CM

## 2020-08-15 NOTE — Telephone Encounter (Signed)
Patient called. She would like to speak with Toni Amend. Her call back number is [phone]

## 2020-08-18 NOTE — Addendum Note (Signed)
Addended by: Cindie Crumbly B on: 08/18/2020 03:01 PM   Modules accepted: Orders

## 2020-08-18 NOTE — Telephone Encounter (Signed)
MRI LSPINE, I think she did not tolerate the Baclofen(please ask) could try Robaxin (methocarbamal) not sure if she has had Lyrica(please ask)

## 2020-08-18 NOTE — Telephone Encounter (Signed)
MRI ordered. Patient states that she has taken Baclofen- it just makes her drowsy. She said she would start taking it again, but her current rx expired May 2021. She does not know if she has taken Lyrica.

## 2020-08-18 NOTE — Telephone Encounter (Signed)
Patient had right L4 TF on 9/9. She reports that since then her "legs feel like they are going to fall off," she hurts as much as before, and she has cramping in both legs at night. Please advise.

## 2020-08-19 ENCOUNTER — Other Ambulatory Visit: Payer: Self-pay | Admitting: Physical Medicine and Rehabilitation

## 2020-08-19 MED ORDER — BACLOFEN 10 MG PO TABS: ORAL_TABLET | ORAL | 0 refills | Status: DC

## 2020-08-19 NOTE — Telephone Encounter (Signed)
Refilled baclofen

## 2020-08-27 ENCOUNTER — Encounter: Payer: Self-pay | Admitting: Podiatry

## 2020-08-27 ENCOUNTER — Ambulatory Visit (INDEPENDENT_AMBULATORY_CARE_PROVIDER_SITE_OTHER): Payer: Medicare Other | Admitting: Podiatry

## 2020-08-27 ENCOUNTER — Other Ambulatory Visit: Payer: Self-pay

## 2020-08-27 DIAGNOSIS — N182 Chronic kidney disease, stage 2 (mild): Secondary | ICD-10-CM | POA: Diagnosis not present

## 2020-08-27 DIAGNOSIS — B351 Tinea unguium: Secondary | ICD-10-CM | POA: Diagnosis not present

## 2020-08-27 DIAGNOSIS — M79674 Pain in right toe(s): Secondary | ICD-10-CM

## 2020-08-27 DIAGNOSIS — M79675 Pain in left toe(s): Secondary | ICD-10-CM | POA: Diagnosis not present

## 2020-08-27 DIAGNOSIS — Q828 Other specified congenital malformations of skin: Secondary | ICD-10-CM

## 2020-08-27 NOTE — Progress Notes (Signed)
This patient returns to my office for at risk foot care.  This patient requires this care by a professional since this patient will be at risk due to having chronic kidney disease stage  3.  Patient says the callus are painful walking and wearing her shoes.  This patient is uable to trim her callus since she cannot reach her feer due to back problems.  This patient presents for at risk foot care today.  General Appearance  Alert, conversant and in no acute stress.  Vascular  Dorsalis pedis and posterior tibial  pulses are palpable  bilaterally.  Capillary return is within normal limits  bilaterally. Temperature is within normal limits  bilaterally.  Neurologic  Senn-Weinstein monofilament wire test within normal limits  bilaterally. Muscle power within normal limits bilaterally.  Nails Thick disfigured discolored nails with subungual debris  from hallux to fifth toes bilaterally. No evidence of bacterial infection or drainage bilaterally.  Orthopedic  No limitations of motion  feet .  No crepitus or effusions noted.  No bony pathology or digital deformities noted.  Enlarged and painful tibial sesamoid right foot.  Skin  normotropic skin   noted bilaterally.  No signs of infections or ulcers noted.   Porokeratosis sub 1 right foot.  Callus left hallux.  Porokeratosis  B/L.  Sesamoiditis sub 1st MPJ right foot.  Consent was obtained for treatment procedures.   Debridement of calluses both feet with a # 15 blade.     Return office visit    10 weeks                   Told patient to return for periodic foot care and evaluation due to potential at risk complications.   Helane Gunther DPM

## 2020-09-06 ENCOUNTER — Other Ambulatory Visit: Payer: Medicare Other

## 2020-09-12 ENCOUNTER — Ambulatory Visit
Admission: RE | Admit: 2020-09-12 | Discharge: 2020-09-12 | Disposition: A | Discharge status: Home / Self Care | Payer: Medicare Other | Source: Ambulatory Visit | Attending: Physical Medicine and Rehabilitation | Admitting: Physical Medicine and Rehabilitation

## 2020-09-12 DIAGNOSIS — G8929 Other chronic pain: Secondary | ICD-10-CM

## 2020-09-16 ENCOUNTER — Other Ambulatory Visit: Payer: Self-pay | Admitting: Internal Medicine

## 2020-09-16 DIAGNOSIS — Z1231 Encounter for screening mammogram for malignant neoplasm of breast: Secondary | ICD-10-CM

## 2020-10-09 ENCOUNTER — Other Ambulatory Visit: Payer: Self-pay

## 2020-10-09 ENCOUNTER — Encounter: Payer: Self-pay | Admitting: Physical Medicine and Rehabilitation

## 2020-10-09 ENCOUNTER — Ambulatory Visit (INDEPENDENT_AMBULATORY_CARE_PROVIDER_SITE_OTHER): Payer: Medicare Other | Admitting: Physical Medicine and Rehabilitation

## 2020-10-09 VITALS — BP 122/58 | HR 89

## 2020-10-09 DIAGNOSIS — M48061 Spinal stenosis, lumbar region without neurogenic claudication: Secondary | ICD-10-CM | POA: Diagnosis not present

## 2020-10-09 DIAGNOSIS — M5416 Radiculopathy, lumbar region: Secondary | ICD-10-CM

## 2020-10-09 DIAGNOSIS — M7918 Myalgia, other site: Secondary | ICD-10-CM

## 2020-10-09 DIAGNOSIS — M419 Scoliosis, unspecified: Secondary | ICD-10-CM

## 2020-10-09 DIAGNOSIS — M25551 Pain in right hip: Secondary | ICD-10-CM

## 2020-10-09 DIAGNOSIS — G894 Chronic pain syndrome: Secondary | ICD-10-CM

## 2020-10-09 NOTE — Progress Notes (Signed)
Here for MRI review. Still having severe leg cramps- worse at night. Takes homeopathic medicine for leg cramps. Stood once during the night to relieve the cramps and passed out. Numeric Pain Rating Scale and Functional Assessment Average Pain 5   In the last MONTH (on 0-10 scale) has pain interfered with the following?  1. General activity like being  able to carry out your everyday physical activities such as walking, climbing stairs, carrying groceries, or moving a chair?  Rating(10)

## 2020-10-10 ENCOUNTER — Telehealth: Payer: Self-pay | Admitting: Physical Medicine and Rehabilitation

## 2020-10-10 NOTE — Telephone Encounter (Signed)
Called pt and spoke with husband that I will call her back Monday.

## 2020-10-10 NOTE — Telephone Encounter (Signed)
Pt has Medicare Part B, Auth# Not Req, Okay to sch.

## 2020-10-10 NOTE — Telephone Encounter (Signed)
Needs authorization and scheduling for repeat bilateral L3-4, L4-5, and L5-S1 RFA. This was last done in May of 2020.

## 2020-10-13 NOTE — Telephone Encounter (Signed)
Called pt and sch 1/6. We talked about both inj that day and meds for her appt.

## 2020-10-17 ENCOUNTER — Other Ambulatory Visit: Payer: Self-pay

## 2020-10-17 ENCOUNTER — Ambulatory Visit
Admission: RE | Admit: 2020-10-17 | Discharge: 2020-10-17 | Disposition: A | Discharge status: Home / Self Care | Payer: Medicare Other | Source: Ambulatory Visit | Attending: Internal Medicine | Admitting: Internal Medicine

## 2020-10-17 DIAGNOSIS — Z1231 Encounter for screening mammogram for malignant neoplasm of breast: Secondary | ICD-10-CM

## 2020-10-27 ENCOUNTER — Other Ambulatory Visit: Payer: Self-pay | Admitting: Internal Medicine

## 2020-10-27 DIAGNOSIS — R928 Other abnormal and inconclusive findings on diagnostic imaging of breast: Secondary | ICD-10-CM

## 2020-11-05 ENCOUNTER — Other Ambulatory Visit: Payer: Medicare Other

## 2020-11-05 ENCOUNTER — Other Ambulatory Visit: Payer: Self-pay

## 2020-11-05 ENCOUNTER — Ambulatory Visit: Admission: RE | Admit: 2020-11-05 | Payer: Medicare Other | Source: Ambulatory Visit

## 2020-11-05 ENCOUNTER — Ambulatory Visit
Admission: RE | Admit: 2020-11-05 | Discharge: 2020-11-05 | Disposition: A | Discharge status: Home / Self Care | Payer: Medicare Other | Source: Ambulatory Visit | Attending: Internal Medicine | Admitting: Internal Medicine

## 2020-11-05 DIAGNOSIS — R928 Other abnormal and inconclusive findings on diagnostic imaging of breast: Secondary | ICD-10-CM

## 2020-11-18 ENCOUNTER — Ambulatory Visit (INDEPENDENT_AMBULATORY_CARE_PROVIDER_SITE_OTHER): Payer: Medicare Other | Admitting: Podiatry

## 2020-11-18 ENCOUNTER — Other Ambulatory Visit: Payer: Self-pay

## 2020-11-18 ENCOUNTER — Encounter: Payer: Self-pay | Admitting: Podiatry

## 2020-11-18 DIAGNOSIS — N182 Chronic kidney disease, stage 2 (mild): Secondary | ICD-10-CM

## 2020-11-18 DIAGNOSIS — Q828 Other specified congenital malformations of skin: Secondary | ICD-10-CM | POA: Diagnosis not present

## 2020-11-18 NOTE — Progress Notes (Signed)
This patient returns to my office for at risk foot care.  This patient requires this care by a professional since this patient will be at risk due to having chronic kidney disease stage  3.  Patient says the callus are painful walking and wearing her shoes.  This patient is uable to trim her callus since she cannot reach her feer due to back problems.  This patient presents for at risk foot care today.  General Appearance  Alert, conversant and in no acute stress.  Vascular  Dorsalis pedis and posterior tibial  pulses are palpable  bilaterally.  Capillary return is within normal limits  bilaterally. Temperature is within normal limits  bilaterally.  Neurologic  Senn-Weinstein monofilament wire test within normal limits  bilaterally. Muscle power within normal limits bilaterally.  Nails Thick disfigured discolored nails with subungual debris  from hallux to fifth toes bilaterally. No evidence of bacterial infection or drainage bilaterally.  Orthopedic  No limitations of motion  feet .  No crepitus or effusions noted.  No bony pathology or digital deformities noted.  Enlarged and painful tibial sesamoid right foot.  Skin  normotropic skin   noted bilaterally.  No signs of infections or ulcers noted.   Porokeratosis sub 1 right foot.  Callus left hallux.  Porokeratosis  B/L.  Sesamoiditis sub 1st MPJ right foot.  Consent was obtained for treatment procedures.   Debridement of calluses both feet with a # 15 blade.  Patient has acquired gel pads which help with her pain.    Return office visit    10 weeks                   Told patient to return for periodic foot care and evaluation due to potential at risk complications.   Duwane Gewirtz DPM  

## 2020-11-19 ENCOUNTER — Other Ambulatory Visit: Payer: Self-pay | Admitting: Physical Medicine and Rehabilitation

## 2020-11-19 MED ORDER — DIAZEPAM 5 MG PO TABS: ORAL_TABLET | ORAL | 0 refills | Status: DC

## 2020-11-19 NOTE — Progress Notes (Signed)
Pre-procedure diazepam ordered for pre-operative anxiety.  

## 2020-11-19 NOTE — Telephone Encounter (Signed)
done

## 2020-12-01 ENCOUNTER — Inpatient Hospital Stay: Payer: Medicare Other | Attending: Nurse Practitioner

## 2020-12-01 ENCOUNTER — Other Ambulatory Visit: Payer: Self-pay

## 2020-12-01 LAB — CBC WITH DIFFERENTIAL (CANCER CENTER ONLY)
Abs Immature Granulocytes: 0.01 K/uL (ref 0.00–0.07)
Basophils Absolute: 0 K/uL (ref 0.0–0.1)
Basophils Relative: 0 %
Eosinophils Absolute: 0 K/uL (ref 0.0–0.5)
Eosinophils Relative: 1 %
HCT: 32.2 % — ABNORMAL LOW (ref 36.0–46.0)
Hemoglobin: 11.1 g/dL — ABNORMAL LOW (ref 12.0–15.0)
Immature Granulocytes: 0 %
Lymphocytes Relative: 28 %
Lymphs Abs: 1.3 K/uL (ref 0.7–4.0)
MCH: 35.5 pg — ABNORMAL HIGH (ref 26.0–34.0)
MCHC: 34.5 g/dL (ref 30.0–36.0)
MCV: 102.9 fL — ABNORMAL HIGH (ref 80.0–100.0)
Monocytes Absolute: 0.4 K/uL (ref 0.1–1.0)
Monocytes Relative: 10 %
Neutro Abs: 2.8 K/uL (ref 1.7–7.7)
Neutrophils Relative %: 61 %
Platelet Count: 182 K/uL (ref 150–400)
RBC: 3.13 MIL/uL — ABNORMAL LOW (ref 3.87–5.11)
RDW: 12.5 % (ref 11.5–15.5)
WBC Count: 4.5 K/uL (ref 4.0–10.5)
nRBC: 0 % (ref 0.0–0.2)

## 2020-12-01 LAB — FERRITIN: Ferritin: 198 ng/mL (ref 11–307)

## 2020-12-02 ENCOUNTER — Ambulatory Visit (INDEPENDENT_AMBULATORY_CARE_PROVIDER_SITE_OTHER): Payer: Medicare Other | Admitting: Family Medicine

## 2020-12-02 ENCOUNTER — Encounter: Payer: Self-pay | Admitting: Family Medicine

## 2020-12-02 ENCOUNTER — Ambulatory Visit: Payer: Medicare Other | Admitting: Family Medicine

## 2020-12-02 DIAGNOSIS — M7989 Other specified soft tissue disorders: Secondary | ICD-10-CM | POA: Diagnosis not present

## 2020-12-02 DIAGNOSIS — M79604 Pain in right leg: Secondary | ICD-10-CM | POA: Diagnosis not present

## 2020-12-02 MED ORDER — DOXYCYCLINE HYCLATE 100 MG PO CAPS: 100.0000 mg | ORAL_CAPSULE | Freq: Two times a day (BID) | ORAL | 0 refills | Status: AC

## 2020-12-02 NOTE — Progress Notes (Signed)
Office Visit Note   Patient: Catherine Harrington           Date of Birth: 12-03-1945           MRN: [id number] Visit Date: 12/02/2020 Requested by: Martha Clan, MD [address] [place],  [place] [address] PCP: Martha Clan, MD  Subjective: Chief Complaint  Patient presents with  . Right Knee - Pain    Woke up with a swollen and painful knee on 11/22/20. Hurt to bear weight. 2 days before that she had swelling and redness in the medial distal lower leg. Iced the knee and rested and has had a little relief. Still has some redness in the leg --- does have a skin condition that causes much itching. Takes hydroxyzine from her dermatologist, Dr. [place].    HPI: She is here with right leg pain and swelling.  On Christmas day she woke up with pain and swelling on the distal medial right calf.  She could hardly bear weight.  It has improved a little bit since then, but she still has a very tender nodule.  No fevers or chills, no period of immobilization, no history of DVT.  Her last Covid vaccine was about a year ago.  She states that she has had the identical pain happened earlier this year and it went away spontaneously without requiring a visit to the doctor.  Has a history of lupus and hemochromatosis.  Her most recent blood work for hemochromatosis was yesterday.              ROS:   All other systems were reviewed and are negative.  Objective: Vital Signs: There were no vitals taken for this visit.  Physical Exam:  General:  Alert and oriented, in no acute distress. Pulm:  Breathing unlabored. Psy:  Normal mood, congruent affect. Skin: There is some erythema on the distal medial right calf/shin.  No warmth.  There is a subcutaneous nodule that is very tender.  She has 1+ pitting edema to mid shin. Right ankle has good range of motion with no pain on resisted strength testing.  Imaging: No results found.  Assessment & Plan: 1.  Right leg pain and swelling, etiology uncertain.  Could be  autoimmune related versus cellulitis versus blood clot. -We will draw a D-dimer today.  If positive, then Doppler studies.  If negative, she will take doxycycline.  If that still does not help, then she will need additional work-up.     Procedures: No procedures performed        PMFS History: Patient Active Problem List   Diagnosis Date Noted  . Pain due to onychomycosis of toenails of both feet 08/27/2020  . Myofascial pain syndrome 03/20/2020  . Scoliosis of thoracolumbar spine 02/18/2020  . Spinal stenosis of lumbar region without neurogenic claudication 02/18/2020  . Porokeratosis 01/16/2020  . Fibrosis of liver 04/18/2018  . Macrocytic anemia 06/29/2016  . Chronic renal insufficiency, stage III (moderate) (HCC) 06/29/2016  . Acute right flank pain 06/29/2016  . Chronic renal insufficiency 06/29/2016  . Hemochromatosis 05/18/2016  . 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 Macrocytosis 08/16/2013  . Syncope 08/16/2013  . DIVERTICULOSIS, COLON 11/04/2010   Past Medical History:  Diagnosis Date  . Acute right flank pain 06/29/2016  . Arthritis    "related to the lupus; joints" (08/16/2013)  . Chronic renal insufficiency, stage III (moderate) (HCC) 06/29/2016  . 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 Family history of  anesthesia complication    "daughter doesn't wake up easy from it" (08/16/2013)  . Fibrosis of liver 04/18/2018  . 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 Hypertension   . Iron deficiency anemia   . Lumbar disc disease   . Lupus (HCC)    "just the rash and arthritis" (08/16/2013)  . Macrocytic anemia 06/29/2016  . 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 Scoliosis   . Syncope and collapse    "lost consciousness ~ 4-5 seconds; didn't hurt myself" (08/16/2013)    Family History  Problem Relation Age of Onset  . Hypertension Mother   . Hypertension Father     Past Surgical  History:  Procedure Laterality Date  . CATARACT EXTRACTION Bilateral 10/19 11/19  . CESAREAN SECTION  1967; 1971  . MOUTH SURGERY  2013   " infection under bridge cleaned out, etc" (08/16/2013)  . TUBAL LIGATION     Social History   Occupational History  . Occupation: retired  Tobacco Use  . Smoking status: Current Every Day Smoker    Packs/day: 1.00    Years: 48.00    Pack years: 48.00    Types: Cigarettes  . Smokeless tobacco: Never Used  Substance and Sexual Activity  . Alcohol use: Yes    Alcohol/week: 21.0 standard drinks    Types: 21 Cans of beer per week    Comment:  3-4 drinks daily sometimes.  . Drug use: No  . Sexual activity: Not Currently    Partners: Male    Birth control/protection: Post-menopausal

## 2020-12-03 ENCOUNTER — Ambulatory Visit (HOSPITAL_COMMUNITY)
Admission: RE | Admit: 2020-12-03 | Discharge: 2020-12-03 | Disposition: A | Discharge status: Home / Self Care | Payer: Medicare Other | Source: Ambulatory Visit | Attending: Family Medicine | Admitting: Family Medicine

## 2020-12-03 ENCOUNTER — Other Ambulatory Visit: Payer: Self-pay

## 2020-12-03 ENCOUNTER — Telehealth: Payer: Self-pay | Admitting: Family Medicine

## 2020-12-03 DIAGNOSIS — R7989 Other specified abnormal findings of blood chemistry: Secondary | ICD-10-CM | POA: Diagnosis not present

## 2020-12-03 DIAGNOSIS — M79604 Pain in right leg: Secondary | ICD-10-CM | POA: Diagnosis not present

## 2020-12-03 DIAGNOSIS — M7989 Other specified soft tissue disorders: Secondary | ICD-10-CM | POA: Diagnosis not present

## 2020-12-03 LAB — D-DIMER, QUANTITATIVE: D-Dimer, Quant: 1.09 mcg/mL FEU — ABNORMAL HIGH (ref <0.50)

## 2020-12-03 NOTE — Telephone Encounter (Signed)
Sent message to [place] with [facility] advising pt is not in office to schedule with pt. She will call pt to schedule today

## 2020-12-03 NOTE — Telephone Encounter (Signed)
No blood clot seen.  Proceed with taking antibiotics as discussed.

## 2020-12-03 NOTE — Telephone Encounter (Signed)
D-Dimer is positive.  I will order doppler studies to rule out blood clot.

## 2020-12-04 ENCOUNTER — Telehealth: Payer: Self-pay | Admitting: Physical Medicine and Rehabilitation

## 2020-12-04 ENCOUNTER — Encounter: Payer: Self-pay | Admitting: Physical Medicine and Rehabilitation

## 2020-12-04 ENCOUNTER — Ambulatory Visit: Payer: Self-pay

## 2020-12-04 ENCOUNTER — Ambulatory Visit (INDEPENDENT_AMBULATORY_CARE_PROVIDER_SITE_OTHER): Payer: Medicare Other | Admitting: Physical Medicine and Rehabilitation

## 2020-12-04 VITALS — BP 109/64 | HR 90

## 2020-12-04 DIAGNOSIS — M47816 Spondylosis without myelopathy or radiculopathy, lumbar region: Secondary | ICD-10-CM | POA: Diagnosis not present

## 2020-12-04 MED ORDER — METHYLPREDNISOLONE ACETATE 80 MG/ML IJ SUSP
80.0000 mg | Freq: Once | INTRAMUSCULAR | Status: AC
Start: 2020-12-04 — End: 2020-12-04
  Administered 2020-12-04: 80 mg

## 2020-12-04 NOTE — Progress Notes (Signed)
Pt state lower back pain. Pt state walking, standing and sitting makes the pain worse. Pt state she takes over the counter pain meds and heating pads helps ease the pain.   Numeric Pain Rating Scale and Functional Assessment Average Pain 7   In the last MONTH (on 0-10 scale) has pain interfered with the following?  1. General activity like being  able to carry out your everyday physical activities such as walking, climbing stairs, carrying groceries, or moving a chair?  Rating(10)   +Driver, -BT, -Dye Allergies.

## 2020-12-08 ENCOUNTER — Telehealth: Payer: Self-pay

## 2020-12-08 ENCOUNTER — Other Ambulatory Visit (INDEPENDENT_AMBULATORY_CARE_PROVIDER_SITE_OTHER): Payer: Self-pay | Admitting: Physical Medicine and Rehabilitation

## 2020-12-08 NOTE — Telephone Encounter (Signed)
-----   Message from Malachy Mood, MD sent at 12/06/2020  9:29 PM EST ----- Please let pt know her iron level is fine, no need phlebotomy this time. Thanks   Malachy Mood

## 2020-12-08 NOTE — Telephone Encounter (Signed)
I spoke with Catherine Harrington and relayed Dr Latanya Maudlin comments and recommendations.  She verbalized understanding.

## 2020-12-08 NOTE — Telephone Encounter (Signed)
Please advise 

## 2020-12-12 ENCOUNTER — Other Ambulatory Visit: Payer: Self-pay | Admitting: Nurse Practitioner

## 2020-12-12 DIAGNOSIS — K7402 Hepatic fibrosis, advanced fibrosis: Secondary | ICD-10-CM

## 2020-12-17 ENCOUNTER — Other Ambulatory Visit: Payer: Medicare Other

## 2021-01-02 ENCOUNTER — Other Ambulatory Visit: Payer: Self-pay

## 2021-01-02 ENCOUNTER — Ambulatory Visit
Admission: RE | Admit: 2021-01-02 | Discharge: 2021-01-02 | Disposition: A | Discharge status: Home / Self Care | Payer: Medicare Other | Source: Ambulatory Visit | Attending: Nurse Practitioner | Admitting: Nurse Practitioner

## 2021-01-02 DIAGNOSIS — K7402 Hepatic fibrosis, advanced fibrosis: Secondary | ICD-10-CM

## 2021-01-14 ENCOUNTER — Ambulatory Visit (INDEPENDENT_AMBULATORY_CARE_PROVIDER_SITE_OTHER): Payer: Medicare Other | Admitting: Physical Medicine and Rehabilitation

## 2021-01-14 ENCOUNTER — Other Ambulatory Visit: Payer: Self-pay

## 2021-01-14 ENCOUNTER — Encounter: Payer: Self-pay | Admitting: Physical Medicine and Rehabilitation

## 2021-01-14 VITALS — BP 127/72 | HR 88 | Resp 95

## 2021-01-14 DIAGNOSIS — M25551 Pain in right hip: Secondary | ICD-10-CM | POA: Diagnosis not present

## 2021-01-14 DIAGNOSIS — M4156 Other secondary scoliosis, lumbar region: Secondary | ICD-10-CM | POA: Diagnosis not present

## 2021-01-14 DIAGNOSIS — I6521 Occlusion and stenosis of right carotid artery: Secondary | ICD-10-CM

## 2021-01-14 DIAGNOSIS — M48061 Spinal stenosis, lumbar region without neurogenic claudication: Secondary | ICD-10-CM

## 2021-01-14 DIAGNOSIS — M47816 Spondylosis without myelopathy or radiculopathy, lumbar region: Secondary | ICD-10-CM

## 2021-01-14 DIAGNOSIS — G894 Chronic pain syndrome: Secondary | ICD-10-CM

## 2021-01-14 DIAGNOSIS — M7918 Myalgia, other site: Secondary | ICD-10-CM

## 2021-01-14 NOTE — Progress Notes (Signed)
Catherine Harrington - 75 y.o. female MRN [id number]  Date of birth: January 15, 1946  Office Visit Note: Visit Date: 12/04/2020 PCP: Martha Clan, MD Referred by: Martha Clan, MD  Subjective: Chief Complaint  Patient presents with  . Lower Back - Pain   HPI:  Catherine Harrington is a 75 y.o. female who comes in today for planned radiofrequency ablation of the Bilateral L3-L4, L4-L5 and L5-S1 Lumbar facet joints. This would be ablation of the corresponding medial branches and/or dorsal rami.  Patient has had double diagnostic blocks with more than 50% relief.  These are documented on pain diary.  They have had chronic back pain for quite some time, more than 3 months, which has been an ongoing situation with recalcitrant axial back pain.  They have no radicular pain.  Their axial pain is worse with standing and ambulating and on exam today with facet loading.  They have had physical therapy as well as home exercise program.  The imaging noted in the chart below indicated facet pathology. Accordingly they meet all the criteria and qualification for for radiofrequency ablation and we are going to complete this today hopefully for more longer term relief as part of comprehensive management program.  She really suffers from chronic pain syndrome with history of significant lumbar degenerative scoliosis with foraminal narrowing and multilevel facet arthropathy and degenerative change.  Not a surgical candidate and still continue to use some opioid management.  She is tried and failed all manner of other conservative care.  ROS Otherwise per HPI.  Assessment & Plan: Visit Diagnoses:    ICD-10-CM   1. Spondylosis without myelopathy or radiculopathy, lumbar region  M47.816 XR C-ARM NO REPORT    Radiofrequency,Lumbar    methylPREDNISolone acetate (DEPO-MEDROL) injection 80 mg    Plan: No additional findings.   Meds & Orders:  Meds ordered this encounter  Medications  . methylPREDNISolone acetate (DEPO-MEDROL)  injection 80 mg    Orders Placed This Encounter  Procedures  . Radiofrequency,Lumbar  . XR C-ARM NO REPORT    Follow-up: Return in about 4 weeks (around 01/01/2021).   Procedures: No procedures performed  Lumbar Facet Joint Nerve Denervation  Patient: Catherine Harrington      Date of Birth: January 06, 1946 MRN: [id number] PCP: Martha Clan, MD      Visit Date: 12/04/2020   Universal Protocol:    Date/Time: 02/16/225:54 AM  Consent Given By: the patient  Position: PRONE  Additional Comments: Vital signs were monitored before and after the procedure. Patient was prepped and draped in the usual sterile fashion. The correct patient, procedure, and site was verified.   Injection Procedure Details:   Procedure diagnoses:  1. Spondylosis without myelopathy or radiculopathy, lumbar region      Meds Administered:  Meds ordered this encounter  Medications  . methylPREDNISolone acetate (DEPO-MEDROL) injection 80 mg     Laterality: Bilateral  Location/Site:  L3-L4, L2 and L3 medial branches, L4-L5, L3 and L4 medial branches and L5-S1, L4 medial branch and L5 dorsal ramus  Needle: 18 ga.,  46mm active tip RF Cannula  Needle Placement: Along juncture of superior articular process and transverse pocess  Findings:  -Comments:  Procedure Details: For each desired target nerve, the corresponding transverse process (sacral ala for the L5 dorsal rami) was identified and the fluoroscope was positioned to square off the endplates of the corresponding vertebral body to achieve a true AP midline view.  The beam was then obliqued 15 to 20 degrees and caudally tilted 15  to 20 degrees to line up a trajectory along the target nerves. The skin over the target of the junction of superior articulating process and transverse process (sacral ala for the L5 dorsal rami) was infiltrated with 59ml of 1% Lidocaine without Epinephrine.  The 18 gauge 52mm active tip outer cannula was advanced in trajectory view to  the target.  This procedure was repeated for each target nerve.  Then, for all levels, the outer cannula placement was fine-tuned and the position was then confirmed with bi-planar imaging.    Test stimulation was done both at sensory and motor levels to ensure there was no radicular stimulation. The target tissues were then infiltrated with 1 ml of 1% Lidocaine without Epinephrine. Subsequently, a percutaneous neurotomy was carried out for 90 seconds at 80 degrees Celsius.  After the completion of the lesion, 1 ml of injectate was delivered. It was then repeated for each facet joint nerve mentioned above. Appropriate radiographs were obtained to verify the probe placement during the neurotomy.   Additional Comments:  The patient tolerated the procedure well Dressing: 2 x 2 sterile gauze and Band-Aid    Post-procedure details: Patient was observed during the procedure. Post-procedure instructions were reviewed.  Patient left the clinic in stable condition.        Clinical History: MRI LUMBAR SPINE WITHOUT CONTRAST 05/09/2017  TECHNIQUE: Multiplanar, multisequence MR imaging of the lumbar spine was performed. No intravenous contrast was administered.  COMPARISON:  Lumbar spine MRI 06/11/2014  FINDINGS: Segmentation:  Normal  Alignment:  There is right convex lumbar scoliosis with apex at L3.  Vertebrae: Bone marrow signal is heterogeneous, unchanged. No evidence of discitis osteomyelitis. No acute compression fracture.  Conus medullaris: Extends to the L1 level and appears normal.  Paraspinal and other soft tissues: There is fatty atrophy of the posterior paraspinous muscles, slightly worse on the left.  Disc levels:  T9-T12: These levels are assessed on sagittal sequences only. No disc herniation, spinal canal stenosis or neural foraminal narrowing.  T12-L1: Normal disc space and facets. No spinal canal or neuroforaminal stenosis.  L1-L2: Severe disc  space narrowing with minimal bulge. Moderate right and mild left neural foraminal narrowing, primarily caused by scoliotic curvature.  L2-L3: Severe disc space loss with endplate osteophytosis. No spinal canal stenosis. Moderate right and severe left neural foraminal stenosis, unchanged. Left-greater-than-right facet arthrosis.  L3-L4: Severe disc space narrowing. No spinal canal stenosis. There is unchanged severe left foraminal stenosis. Right neural foramen is widely patent.  L4-L5: Disc space narrowing with central disc protrusion, unchanged. Severe bilateral facet hypertrophy, also unchanged. No central spinal canal stenosis. Severe right and moderate left neural foraminal stenosis, unchanged.  L5-S1: Moderate bilateral facet hypertrophy but no stenosis.  Visualized sacrum: Normal.  IMPRESSION: 1. Unchanged appearance of marked lumbar dextroscoliosis, which, in combination degenerative disc disease and facet arthrosis, that leads to severe neural foraminal narrowing at multiple levels, including L2-3, L3-4 and L4-5. These findings are unchanged, however, compared to the MRI of 06/11/2014. 2. No central spinal canal stenosis of the lumbar spine. 3. Multilevel moderate to severe facet arthrosis, which may serve as a source of localized back pain.  Lspine MRI 06/11/14 There is moderate lumbar dextroscoliosis with apex at L3. There is left lateral listhesis of L2 on L3 and right lateral listhesis of L3 on L4 and L4 on L5. Bone marrow signal is somewhat heterogeneous diffusely and overall decreased in signal in the lower thoracic spine (possibly reflecting patient's history of anemia) with  prominent fatty degenerative marrow changes in the mid and lower lumbar spine. Modic type 1 degenerative marrow changes are also noted in this region. There is lateral recess narrowing at L2-3 on the right.     Objective:  VS:  HT:    WT:   BMI:     BP:109/64  HR:90bpm  TEMP: (  )  RESP:  Physical Exam Vitals and nursing note reviewed.  Constitutional:      General: She is not in acute distress.    Appearance: Normal appearance. She is not ill-appearing.  HENT:     Head: Normocephalic and atraumatic.     Right Ear: External ear normal.     Left Ear: External ear normal.  Eyes:     Extraocular Movements: Extraocular movements intact.  Cardiovascular:     Rate and Rhythm: Normal rate.     Pulses: Normal pulses.  Pulmonary:     Effort: Pulmonary effort is normal. No respiratory distress.  Abdominal:     General: There is no distension.     Palpations: Abdomen is soft.  Musculoskeletal:        General: Tenderness present.     Cervical back: Neck supple.     Right lower leg: No edema.     Left lower leg: No edema.     Comments: Patient has good distal strength with no pain over the greater trochanters.  No clonus or focal weakness. Patient somewhat slow to rise from a seated position to full extension.  There is concordant low back pain with facet loading and lumbar spine extension rotation.  There are no definitive trigger points but the patient is somewhat tender across the lower back and PSIS.  There is no pain with hip rotation.   Skin:    Findings: No erythema, lesion or rash.  Neurological:     General: No focal deficit present.     Mental Status: She is alert and oriented to person, place, and time.     Sensory: No sensory deficit.     Motor: No weakness or abnormal muscle tone.     Coordination: Coordination normal.  Psychiatric:        Mood and Affect: Mood normal.        Behavior: Behavior normal.      Imaging: No results found.

## 2021-01-14 NOTE — Procedures (Signed)
Lumbar Facet Joint Nerve Denervation  Patient: Catherine Harrington      Date of Birth: 16-Jan-1946 MRN: [id number] PCP: Martha Clan, MD      Visit Date: 12/04/2020   Universal Protocol:    Date/Time: 02/16/225:54 AM  Consent Given By: the patient  Position: PRONE  Additional Comments: Vital signs were monitored before and after the procedure. Patient was prepped and draped in the usual sterile fashion. The correct patient, procedure, and site was verified.   Injection Procedure Details:   Procedure diagnoses:  1. Spondylosis without myelopathy or radiculopathy, lumbar region      Meds Administered:  Meds ordered this encounter  Medications  . methylPREDNISolone acetate (DEPO-MEDROL) injection 80 mg     Laterality: Bilateral  Location/Site:  L3-L4, L2 and L3 medial branches, L4-L5, L3 and L4 medial branches and L5-S1, L4 medial branch and L5 dorsal ramus  Needle: 18 ga.,  6mm active tip RF Cannula  Needle Placement: Along juncture of superior articular process and transverse pocess  Findings:  -Comments:  Procedure Details: For each desired target nerve, the corresponding transverse process (sacral ala for the L5 dorsal rami) was identified and the fluoroscope was positioned to square off the endplates of the corresponding vertebral body to achieve a true AP midline view.  The beam was then obliqued 15 to 20 degrees and caudally tilted 15 to 20 degrees to line up a trajectory along the target nerves. The skin over the target of the junction of superior articulating process and transverse process (sacral ala for the L5 dorsal rami) was infiltrated with 6ml of 1% Lidocaine without Epinephrine.  The 18 gauge 70mm active tip outer cannula was advanced in trajectory view to the target.  This procedure was repeated for each target nerve.  Then, for all levels, the outer cannula placement was fine-tuned and the position was then confirmed with bi-planar imaging.    Test stimulation  was done both at sensory and motor levels to ensure there was no radicular stimulation. The target tissues were then infiltrated with 1 ml of 1% Lidocaine without Epinephrine. Subsequently, a percutaneous neurotomy was carried out for 90 seconds at 80 degrees Celsius.  After the completion of the lesion, 1 ml of injectate was delivered. It was then repeated for each facet joint nerve mentioned above. Appropriate radiographs were obtained to verify the probe placement during the neurotomy.   Additional Comments:  The patient tolerated the procedure well Dressing: 2 x 2 sterile gauze and Band-Aid    Post-procedure details: Patient was observed during the procedure. Post-procedure instructions were reviewed.  Patient left the clinic in stable condition.

## 2021-01-14 NOTE — Progress Notes (Signed)
Numeric Pain Rating Scale and Functional Assessment Average Pain 5   In the last MONTH (on 0-10 scale) has pain interfered with the following?  1. General activity like being  able to carry out your everyday physical activities such as walking, climbing stairs, carrying groceries, or moving a chair?  Rating(5) No driver today. Pain is not bad today. Maybe 2/10. No activity today.

## 2021-02-03 ENCOUNTER — Encounter: Payer: Self-pay | Admitting: Gastroenterology

## 2021-02-03 ENCOUNTER — Other Ambulatory Visit: Payer: Self-pay

## 2021-02-03 ENCOUNTER — Ambulatory Visit (INDEPENDENT_AMBULATORY_CARE_PROVIDER_SITE_OTHER): Payer: Medicare Other | Admitting: Podiatry

## 2021-02-03 ENCOUNTER — Encounter: Payer: Self-pay | Admitting: Podiatry

## 2021-02-03 DIAGNOSIS — N182 Chronic kidney disease, stage 2 (mild): Secondary | ICD-10-CM

## 2021-02-03 DIAGNOSIS — Q828 Other specified congenital malformations of skin: Secondary | ICD-10-CM | POA: Diagnosis not present

## 2021-02-03 NOTE — Progress Notes (Signed)
This patient returns to my office for at risk foot care.  This patient requires this care by a professional since this patient will be at risk due to having chronic kidney disease stage  3.  Patient says the callus are painful walking and wearing her shoes.  This patient is uable to trim her callus since she cannot reach her feer due to back problems.  This patient presents for at risk foot care today.  General Appearance  Alert, conversant and in no acute stress.  Vascular  Dorsalis pedis and posterior tibial  pulses are palpable  bilaterally.  Capillary return is within normal limits  bilaterally. Temperature is within normal limits  bilaterally.  Neurologic  Senn-Weinstein monofilament wire test within normal limits  bilaterally. Muscle power within normal limits bilaterally.  Nails Thick disfigured discolored nails with subungual debris  from hallux to fifth toes bilaterally. No evidence of bacterial infection or drainage bilaterally.  Orthopedic  No limitations of motion  feet .  No crepitus or effusions noted.  No bony pathology or digital deformities noted.  Enlarged and painful tibial sesamoid right foot.  Skin  normotropic skin   noted bilaterally.  No signs of infections or ulcers noted.   Porokeratosis sub 1 right foot.  Callus left hallux.  Porokeratosis  B/L.  Sesamoiditis sub 1st MPJ right foot.  Consent was obtained for treatment procedures.   Debridement of calluses both feet with a # 15 blade.  Patient has acquired gel pads which help with her pain.    Return office visit    10 weeks                   Told patient to return for periodic foot care and evaluation due to potential at risk complications.   Helane Gunther DPM

## 2021-02-13 ENCOUNTER — Telehealth: Payer: Self-pay | Admitting: Physical Medicine and Rehabilitation

## 2021-02-13 NOTE — Telephone Encounter (Signed)
Patient called needing Rx refilled Tramadol. The number to contact patient is [phone]

## 2021-02-16 ENCOUNTER — Other Ambulatory Visit: Payer: Self-pay | Admitting: Physical Medicine and Rehabilitation

## 2021-02-16 DIAGNOSIS — M48061 Spinal stenosis, lumbar region without neurogenic claudication: Secondary | ICD-10-CM

## 2021-02-16 DIAGNOSIS — M419 Scoliosis, unspecified: Secondary | ICD-10-CM

## 2021-02-16 MED ORDER — TRAMADOL HCL 50 MG PO TABS: 50.0000 mg | ORAL_TABLET | Freq: Three times a day (TID) | ORAL | 0 refills | Status: AC | PRN

## 2021-02-16 NOTE — Telephone Encounter (Signed)
Please advise 

## 2021-02-16 NOTE — Progress Notes (Signed)
Ok for tramdol trial. Patient's 12 month [place] CSRS database history was reviewed and no inappropriate medication refills noted.

## 2021-02-16 NOTE — Telephone Encounter (Signed)
Done

## 2021-03-27 ENCOUNTER — Other Ambulatory Visit: Payer: Self-pay | Admitting: Internal Medicine

## 2021-03-27 DIAGNOSIS — F17200 Nicotine dependence, unspecified, uncomplicated: Secondary | ICD-10-CM

## 2021-04-01 ENCOUNTER — Other Ambulatory Visit (HOSPITAL_COMMUNITY): Payer: Self-pay | Admitting: Internal Medicine

## 2021-04-01 DIAGNOSIS — I6521 Occlusion and stenosis of right carotid artery: Secondary | ICD-10-CM

## 2021-04-02 ENCOUNTER — Other Ambulatory Visit: Payer: Self-pay

## 2021-04-02 ENCOUNTER — Ambulatory Visit (HOSPITAL_COMMUNITY)
Admission: RE | Admit: 2021-04-02 | Discharge: 2021-04-02 | Disposition: A | Discharge status: Home / Self Care | Payer: Medicare Other | Source: Ambulatory Visit | Attending: Internal Medicine | Admitting: Internal Medicine

## 2021-04-02 DIAGNOSIS — I6521 Occlusion and stenosis of right carotid artery: Secondary | ICD-10-CM

## 2021-04-14 ENCOUNTER — Other Ambulatory Visit: Payer: Self-pay

## 2021-04-14 ENCOUNTER — Ambulatory Visit
Admission: RE | Admit: 2021-04-14 | Discharge: 2021-04-14 | Disposition: A | Discharge status: Home / Self Care | Payer: Medicare Other | Source: Ambulatory Visit | Attending: Internal Medicine | Admitting: Internal Medicine

## 2021-04-14 DIAGNOSIS — F17200 Nicotine dependence, unspecified, uncomplicated: Secondary | ICD-10-CM

## 2021-04-15 ENCOUNTER — Ambulatory Visit (INDEPENDENT_AMBULATORY_CARE_PROVIDER_SITE_OTHER): Payer: Medicare Other | Admitting: Podiatry

## 2021-04-15 ENCOUNTER — Encounter: Payer: Self-pay | Admitting: Podiatry

## 2021-04-15 DIAGNOSIS — Q828 Other specified congenital malformations of skin: Secondary | ICD-10-CM

## 2021-04-15 DIAGNOSIS — N182 Chronic kidney disease, stage 2 (mild): Secondary | ICD-10-CM

## 2021-04-15 NOTE — Progress Notes (Signed)
This patient returns to my office for at risk foot care.  This patient requires this care by a professional since this patient will be at risk due to having chronic kidney disease stage  3.  Patient says the callus are painful walking and wearing her shoes.  This patient is uable to trim her callus since she cannot reach her feer due to back problems.  This patient presents for at risk foot care today.  General Appearance  Alert, conversant and in no acute stress.  Vascular  Dorsalis pedis and posterior tibial  pulses are palpable  bilaterally.  Capillary return is within normal limits  bilaterally. Temperature is within normal limits  bilaterally.  Neurologic  Senn-Weinstein monofilament wire test within normal limits  bilaterally. Muscle power within normal limits bilaterally.  Nails Thick disfigured discolored nails with subungual debris  from hallux to fifth toes bilaterally. No evidence of bacterial infection or drainage bilaterally.  Orthopedic  No limitations of motion  feet .  No crepitus or effusions noted.  No bony pathology or digital deformities noted.  Enlarged and painful tibial sesamoid right foot.  Skin  normotropic skin   noted bilaterally.  No signs of infections or ulcers noted.   Porokeratosis sub 1 right foot.  Callus left hallux.  Porokeratosis  B/L.  Sesamoiditis sub 1st MPJ right foot.  Consent was obtained for treatment procedures.   Debridement of calluses both feet with a # 15 blade.  Patient has acquired gel pads which help with her pain.    Return office visit    10 weeks                   Told patient to return for periodic foot care and evaluation due to potential at risk complications.   Kaileia Flow DPM  

## 2021-05-15 ENCOUNTER — Encounter: Payer: Self-pay | Admitting: Physical Medicine and Rehabilitation

## 2021-05-15 NOTE — Progress Notes (Signed)
Catherine Harrington - 75 y.o. female MRN [id number]  Date of birth: February 04, 1946  Office Visit Note: Visit Date: 10/09/2020 PCP: Cleatis Polka., MD Referred by: Cleatis Polka., MD  Subjective: Chief Complaint  Patient presents with   Lower Back - Pain   HPI: Catherine Harrington is a 75 y.o. female who comes in today For continued follow-up and management of back pain particularly right hip and buttock pain which has been chronic recalcitrant for many years which has been amenable to some degree with injection treatment and physical therapy and medication management.  She does not handle medications very well at all.  Her case is well-known to me and all of our notes can be reviewed.  Brief review is history of hemochromatosis and some liver problems as well as prolonged history of smoking.  She has degenerative lumbar scoliosis and facet arthropathy and foraminal more than canal stenosis.  Because her pain was just not getting much better when I saw her at the last visit we decided to update MRI of the lumbar spine.  Last MRI was in 2018.  New MRI is reviewed with her today with images spine model and the report is below.  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  I do think that is probably the source of a lot of her back and hip pain.  She is also having a lot of cramping issues at night.  She reports 1 time standing up recently to relieve the cramps at night and passed out.  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  Interestingly right now she is not having much in the way of the back pain is much as she usually does and she seems to be almost status quo with her pain level at this point.  Review of  Systems  Musculoskeletal:  Positive for back pain.  Neurological:        Leg cramping  Endo/Heme/Allergies:  Bruises/bleeds easily.  All other systems reviewed and are negative. Otherwise per HPI.  Assessment & Plan: Visit Diagnoses:    ICD-10-CM   1. Foraminal stenosis of lumbar region  M48.061     2. Lumbar radiculopathy  M54.16     3. Pain in right hip  M25.551     4. Scoliosis of thoracolumbar spine, unspecified scoliosis type  M41.9     5. Myofascial pain syndrome  M79.18     6. Chronic pain syndrome  G89.4        Plan: Findings:  1.  Chronic worsening severe low back pain particularly right hip and thigh pain consistent with issue of scoliosis degenerative with significant facet arthropathy and foraminal narrowing and subarticular recess stenosis prickly on the right at L4-5 which could affect the L4 and L5 nerve roots.  Injections in this area in the past have been somewhat beneficial for her over time.  She continues with home exercise program is trying to stay active.  She does not do well with most medications trial of pain management but she does not really want to consider that with a lot of opioids.  We will consider tramadol at some point.  Doing well otherwise now in terms of her  back pain will continue to start to stay the course.  2.  Cramping in the legs could be associated with her spine she does have this foraminal narrowing left and right but no real central stenosis which she would normally see with cramping.  She has a lot of metabolic issues and lab issues with her liver and fibrosis of the liver.  She will follow-up with her primary care physician in [facility] in terms of cramping.  We she is on a homeopathic remedy for this.  She can continue to do that it will not hurt her I do not believe that helped that much but she can continue to do that.  She has tried all manner of home treatment for that and we discussed stretching and hydration as well as magnesium etc.    Meds & Orders: No orders of the defined types were placed in this encounter.  No orders of the defined types were placed in this encounter.   Follow-up: Return if symptoms worsen or fail to improve.   Procedures: No procedures performed      Clinical History: MRI LUMBAR SPINE WITHOUT CONTRAST   TECHNIQUE: Multiplanar, multisequence MR imaging of the lumbar spine was performed. No intravenous contrast was administered.   COMPARISON:  05/09/2017   FINDINGS: Segmentation: 5 lumbar type vertebrae based on the available coverage   Alignment: Marked dextroscoliosis with rightward translation at L4-5. The curvature is centered at the level of L3.   Vertebrae: Degenerative fatty marrow conversion at L2-L5 primarily. No fracture, discitis, or aggressive bone lesion   Conus medullaris and cauda equina: Conus extends to the T12 level. Conus and cauda equina appear normal.   Paraspinal and other soft tissues: Marked muscular atrophy involving the intrinsic back muscles and bilateral psoas. No incidental inflammation or mass is seen about the spine.   Disc levels:   T12- L1: Unremarkable.   L1-L2: Disc collapse and endplate degeneration with ridging. No compressive spinal or foraminal stenosis   L2-L3: Asymmetric leftward disc collapse. Degenerative facet spurring asymmetric to the left. The canal and foramina are patent. The left subarticular recess is effaced.   L3-L4: Disc collapse asymmetric to the left. Intervertebral ankylosis has occurred remotely. Facet spurring asymmetric to the left where there is moderate foraminal narrowing. Left subarticular recess narrowing that could affect the L4 nerve root.   L4-L5: Disc collapse and endplate degeneration. Degenerative facet spurring asymmetric to the right. Advanced right foraminal stenosis. Right subarticular recess narrowing that could certainly affect the L5 nerve root. More moderate left subarticular recess  narrowing.   L5-S1:Degenerative facet spurring with small joint effusions. Preserved disc height and hydration compared to the other levels. No neural compression.   IMPRESSION: 1. No acute or interval finding when compared to 2018. 2. Advanced lumbar spine degeneration and dextroscoliosis. 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 3. L4-5 right foraminal and subarticular recess impingement. 4. On the left there is subarticular recess stenosis at L2-3 to L4-5. 5. Advanced and generalized muscular atrophy.     Electronically Signed   By: Marnee SpringJonathon  Watts M.D.   On: 09/12/2020 18:55   She reports that she has been smoking cigarettes. She has a 48.00 pack-year smoking history. She has never used smokeless tobacco. No results for input(s): HGBA1C, LABURIC in the last 8760 hours.  Objective:  VS:  HT:    WT:   BMI:     BP:(!) 122/58  HR:89bpm  TEMP: ( )  RESP:  Physical Exam Vitals and  nursing note reviewed.  Constitutional:      General: She is not in acute distress.    Appearance: Normal appearance. She is not ill-appearing.     Comments: Very thin  HENT:     Head: Normocephalic and atraumatic.     Right Ear: External ear normal.     Left Ear: External ear normal.  Eyes:     Extraocular Movements: Extraocular movements intact.  Cardiovascular:     Rate and Rhythm: Normal rate.     Pulses: Normal pulses.  Pulmonary:     Effort: Pulmonary effort is normal. No respiratory distress.  Abdominal:     General: There is no distension.     Palpations: Abdomen is soft.  Musculoskeletal:        General: Tenderness present.     Cervical back: Neck supple.     Right lower leg: No edema.     Left lower leg: No edema.     Comments: Patient has good distal strength with no pain over the greater trochanters.  No clonus or focal weakness.  She does have pain over the right PSIS more than the left.  She does have focal tenderness in the quadratus lumborum area.  She has obvious  scoliotic deformity.  She stands with a rightward shift and somewhat forward flexed.  She is slow to rise from a seated position.  She does ambulate without aid.  Skin:    Findings: No erythema, lesion or rash.  Neurological:     General: No focal deficit present.     Mental Status: She is alert and oriented to person, place, and time.     Sensory: No sensory deficit.     Motor: No weakness or abnormal muscle tone.     Coordination: Coordination normal.  Psychiatric:        Mood and Affect: Mood normal.        Behavior: Behavior normal.    Ortho Exam  Imaging: No results found.  Past Medical/Family/Surgical/Social History: Medications & Allergies reviewed per EMR, new medications updated. 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   Macrocytosis 08/16/2013   Syncope 08/16/2013   DIVERTICULOSIS, COLON 11/04/2010   Past Medical History:  Diagnosis Date   Acute right flank pain 06/29/2016   Arthritis    "related to the lupus; joints" (08/16/2013)   Chronic renal insufficiency, stage III (moderate) (HCC) 06/29/2016   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   Family history of anesthesia complication    "daughter doesn't wake up easy from it" (08/16/2013)   Fibrosis of liver 04/18/2018   Hemochromatosis 05/18/2016   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   Hypertension    Iron deficiency anemia    Lumbar disc  disease    Lupus (HCC)    "just the rash and arthritis" (08/16/2013)   Macrocytic anemia 06/29/2016   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/18/2016   Scoliosis    Syncope and collapse    "lost consciousness ~  4-5 seconds; didn't hurt myself" (08/16/2013)   Family History  Problem Relation Age of Onset   Hypertension Mother    Hypertension Father    Past Surgical History:  Procedure Laterality Date   CATARACT EXTRACTION Bilateral 10/19 11/19   CESAREAN SECTION  1967; 1971   MOUTH SURGERY  2013   " infection under bridge cleaned out, etc" (08/16/2013)   TUBAL LIGATION     Social History   Occupational History   Occupation: retired  Tobacco Use   Smoking status: Every Day    Packs/day: 1.00    Years: 48.00    Pack years: 48.00    Types: Cigarettes   Smokeless tobacco: Never  Substance and Sexual Activity   Alcohol use: Yes    Alcohol/week: 21.0 standard drinks    Types: 21 Cans of beer per week    Comment:  3-4 drinks daily sometimes.   Drug use: No   Sexual activity: Not Currently    Partners: Male    Birth control/protection: Post-menopausal

## 2021-05-18 ENCOUNTER — Telehealth: Payer: Self-pay | Admitting: Physical Medicine and Rehabilitation

## 2021-05-18 NOTE — Telephone Encounter (Signed)
Patient called. She would like an appointment with Dr. Alvester Morin. Her call back number is [phone]

## 2021-05-19 ENCOUNTER — Telehealth: Payer: Self-pay

## 2021-05-19 MED ORDER — ACETAMINOPHEN-CODEINE #3 300-30 MG PO TABS: 1.0000 | ORAL_TABLET | Freq: Three times a day (TID) | ORAL | 0 refills | Status: DC | PRN

## 2021-05-19 NOTE — Telephone Encounter (Signed)
Pt req Rx for her pain other than Toradol that makes her sleepy. Pt would like something for Inflammatory. Please Advise.

## 2021-05-19 NOTE — Telephone Encounter (Signed)
Pt wanted to know if tylenol #3 okay with her liver issues

## 2021-05-24 ENCOUNTER — Encounter: Payer: Self-pay | Admitting: Physical Medicine and Rehabilitation

## 2021-05-24 NOTE — Progress Notes (Signed)
Catherine Harrington - 75 y.o. female MRN [id number]  Date of birth: 06/22/1946  Office Visit Note: Visit Date: 01/14/2021 PCP: Cleatis Polka., MD Referred by: Cleatis Polka., MD  Subjective: Chief Complaint  Patient presents with   Right Hip - Pain   Lower Back - Pain    HPI: Catherine Harrington is a 75 y.o. female who comes in today For follow-up evaluation management of chronic history of recalcitrant back pain and mostly right hip pain.  This is in the setting of a patient with history of discoid lupus, hemochromatosis and liver fibrosis.  From a spine standpoint she has degenerative facet arthropathy and degenerative changes throughout with scoliosis.  Scoliosis more of the L2-3 and L3-4 region with ankylosing of that level.  She has left sided foraminal narrowing at that level and right sided foraminal and lateral recess narrowing at L4-5 which I think is her main pain generator.  She also has myofascial pain as well and she is done well with trigger points in the past.  She continues with home exercise program and medication.  She is not been very tolerant of most medications in the past.  She comes in today status post radiofrequency ablation repeat.  We have done this on a couple of occasions and it does reduce her pain to some degree.  She feels pretty good right now rating her pain as a 2 out of 10 with averaging a 5 out of 10.  She has had no leg pain or focal weakness.  Feels weak and tired in general.  Review of Systems  Musculoskeletal:  Positive for back pain and joint pain.  All other systems reviewed and are negative. Otherwise per HPI.  Assessment & Plan: Visit Diagnoses:    ICD-10-CM   1. Other secondary scoliosis, lumbar region  M41.56     2. Foraminal stenosis of lumbar region  M48.061     3. Spondylosis without myelopathy or radiculopathy, lumbar region  M47.816     4. Pain in right hip  M25.551     5. Myofascial pain syndrome  M79.18     6. Chronic pain  syndrome  G89.4        Plan: Findings:  Chronic recalcitrant low back pain and right posterior lateral hip pain.  Doing well at the moment status post radiofrequency ablation.  She will have flareups from time to time which I think are more radicular pain at L5 and L4 on the right.  She has chronic medical conditions which I think her biggest problem right now unfortunately.  She will continue with current medications and we will see her back if there is a flareup or any other concerns.   Meds & Orders: No orders of the defined types were placed in this encounter.  No orders of the defined types were placed in this encounter.   Follow-up: Return if symptoms worsen or fail to improve.   Procedures: No procedures performed      Clinical History: MRI LUMBAR SPINE WITHOUT CONTRAST   TECHNIQUE: Multiplanar, multisequence MR imaging of the lumbar spine was performed. No intravenous contrast was administered.   COMPARISON:  05/09/2017   FINDINGS: Segmentation: 5 lumbar type vertebrae based on the available coverage   Alignment: Marked dextroscoliosis with rightward translation at L4-5. The curvature is centered at the level of L3.   Vertebrae: Degenerative fatty marrow conversion at L2-L5 primarily. No fracture, discitis, or aggressive bone lesion   Conus medullaris and cauda  equina: Conus extends to the T12 level. Conus and cauda equina appear normal.   Paraspinal and other soft tissues: Marked muscular atrophy involving the intrinsic back muscles and bilateral psoas. No incidental inflammation or mass is seen about the spine.   Disc levels:   T12- L1: Unremarkable.   L1-L2: Disc collapse and endplate degeneration with ridging. No compressive spinal or foraminal stenosis   L2-L3: Asymmetric leftward disc collapse. Degenerative facet spurring asymmetric to the left. The canal and foramina are patent. The left subarticular recess is effaced.   L3-L4: Disc collapse  asymmetric to the left. Intervertebral ankylosis has occurred remotely. Facet spurring asymmetric to the left where there is moderate foraminal narrowing. Left subarticular recess narrowing that could affect the L4 nerve root.   L4-L5: Disc collapse and endplate degeneration. Degenerative facet spurring asymmetric to the right. Advanced right foraminal stenosis. Right subarticular recess narrowing that could certainly affect the L5 nerve root. More moderate left subarticular recess narrowing.   L5-S1:Degenerative facet spurring with small joint effusions. Preserved disc height and hydration compared to the other levels. No neural compression.   IMPRESSION: 1. No acute or interval finding when compared to 2018. 2. Advanced lumbar spine degeneration and dextroscoliosis. 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 3. L4-5 right foraminal and subarticular recess impingement. 4. On the left there is subarticular recess stenosis at L2-3 to L4-5. 5. Advanced and generalized muscular atrophy.     Electronically Signed   By: Marnee Spring M.D.   On: 09/12/2020 18:55   She reports that she has been smoking cigarettes. She has a 48.00 pack-year smoking history. She has never used smokeless tobacco. No results for input(s): HGBA1C, LABURIC in the last 8760 hours.  Objective:  VS:  HT:    WT:   BMI:     BP:127/72  HR:88bpm  TEMP: ( )  RESP:  Physical Exam Vitals and nursing note reviewed.  Constitutional:      General: She is not in acute distress.    Appearance: She is not ill-appearing.     Comments: Thin appearing  HENT:     Head: Normocephalic and atraumatic.     Right Ear: External ear normal.     Left Ear: External ear normal.  Eyes:     Extraocular Movements: Extraocular movements intact.  Cardiovascular:     Rate and Rhythm: Normal rate.     Pulses: Normal pulses.  Pulmonary:     Effort: Pulmonary effort is normal. No respiratory distress.  Abdominal:      General: There is no distension.     Palpations: Abdomen is soft.  Musculoskeletal:        General: Tenderness present.     Cervical back: Neck supple.     Right lower leg: No edema.     Left lower leg: No edema.     Comments: Patient has good distal strength with no pain over the greater trochanters.  No clonus or focal weakness.  She does have some pain over the right PSIS.  She has some difficulty going sit to stand in full extension.  She has scoliotic deformity.  Skin:    General: Skin is warm and dry.     Findings: No erythema, lesion or rash.     Comments: Ruddy appearance  Neurological:     General: No focal deficit present.     Mental Status: She is alert and oriented to person, place, and time.     Sensory: No sensory deficit.  Motor: No weakness or abnormal muscle tone.     Coordination: Coordination normal.     Gait: Gait abnormal.  Psychiatric:        Mood and Affect: Mood normal.        Behavior: Behavior normal.    Ortho Exam  Imaging: No results found.  Past Medical/Family/Surgical/Social History: Medications & Allergies reviewed per EMR, new medications updated. 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   Macrocytosis 08/16/2013   Syncope 08/16/2013   DIVERTICULOSIS, COLON 11/04/2010   Past Medical History:  Diagnosis Date   Acute right flank pain 06/29/2016   Arthritis     "related to the lupus; joints" (08/16/2013)   Chronic renal insufficiency, stage III (moderate) (HCC) 06/29/2016   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   Family history of anesthesia complication    "daughter doesn't wake up easy from it" (08/16/2013)   Fibrosis of liver 04/18/2018   Hemochromatosis 05/18/2016   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   Hypertension    Iron deficiency anemia    Lumbar disc disease    Lupus (HCC)    "just the rash and arthritis" (08/16/2013)   Macrocytic anemia 06/29/2016   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/18/2016   Scoliosis    Syncope and collapse    "lost consciousness ~ 4-5 seconds; didn't hurt myself" (08/16/2013)   Family History  Problem Relation Age of Onset   Hypertension Mother    Hypertension Father    Past Surgical History:  Procedure Laterality Date   CATARACT EXTRACTION Bilateral 10/19 11/19   CESAREAN SECTION  1967; 1971   MOUTH SURGERY  2013   " infection under bridge cleaned out, etc" (08/16/2013)   TUBAL LIGATION     Social History   Occupational History   Occupation: retired  Tobacco Use   Smoking status: Every Day    Packs/day: 1.00    Years: 48.00    Pack years: 48.00    Types: Cigarettes   Smokeless tobacco: Never  Substance and Sexual Activity   Alcohol use: Yes    Alcohol/week: 21.0 standard drinks    Types: 21 Cans of beer per week    Comment:  3-4 drinks daily sometimes.   Drug use: No   Sexual activity: Not Currently    Partners: Male    Birth control/protection: Post-menopausal

## 2021-05-27 ENCOUNTER — Telehealth: Payer: Self-pay | Admitting: Hematology

## 2021-05-27 NOTE — Progress Notes (Deleted)
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  She was last seen by Dr. Mosetta Putt 1 year ago.   REVIEW OF SYSTEMS:   Constitutional: Denies fevers, chills or abnormal weight loss Eyes: Denies blurriness of vision Ears, nose, mouth, throat, and face: Denies mucositis or sore throat Respiratory: Denies cough, dyspnea or wheezes Cardiovascular: Denies palpitation, chest discomfort or lower extremity swelling Gastrointestinal:  Denies nausea, heartburn or change in bowel habits Skin: Denies abnormal skin rashes Lymphatics: Denies new lymphadenopathy or easy bruising Neurological:Denies numbness, tingling or new weaknesses Behavioral/Psych: Mood is stable, no new changes  All other systems were reviewed with the patient and are negative.  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   Hypertension    Iron deficiency anemia    Lumbar disc disease    Lupus (HCC)    "just the rash and arthritis" (08/16/2013)   Macrocytic anemia 06/29/2016   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/18/2016   Scoliosis    Syncope and collapse    "lost consciousness ~ 4-5 seconds; didn't hurt myself" (08/16/2013)     SURGICAL HISTORY: Past Surgical History:  Procedure Laterality Date   CATARACT EXTRACTION Bilateral 10/19 11/19   CESAREAN SECTION  1967; 1971   MOUTH SURGERY  2013   " infection under bridge cleaned out, etc" (08/16/2013)   TUBAL LIGATION      I have reviewed the social history and family history with the patient and they are unchanged from previous note.  ALLERGIES:  is allergic to prednisone, sulfonamide derivatives, epinephrine, and midol [ibuprofen].  MEDICATIONS:  Current Outpatient Medications  Medication Sig Dispense Refill   baclofen (LIORESAL) 10 MG tablet Take 1/2 to 1 by mouth every 8hrs as needed for spasm 60 tablet 0   calcium carbonate (OS-CAL) 600 MG TABS Take 600 mg by mouth 2 (two) times daily with a meal.     CREON 24000-76000 units CPEP      dexamethasone 0.5 MG/5ML elixir SWISH 5 MLS IN THE MOUTH FOR 3 MINUTES THEN SPIT THREE TIMES DAILY FOR 3 WEEKS     diazepam (VALIUM) 5 MG tablet Take 1 by mouth 1 hour  pre-procedure with very light food. May bring 2nd tablet to appointment. 2 tablet 0   doxazosin (CARDURA) 8 MG tablet Take 8 mg by mouth at bedtime.     doxycycline (VIBRAMYCIN) 100 MG capsule Take 1 capsule (100 mg total) by mouth 2 (two) times daily. 20 capsule 0   fexofenadine (ALLEGRA) 180 MG tablet Take 180 mg by mouth daily.     gabapentin (NEURONTIN) 300 MG capsule TAKE 1 CAPSULE(300 MG) BY MOUTH AT BEDTIME 90 capsule 3   hydrOXYzine (ATARAX/VISTARIL) 10 MG tablet Take by mouth.     levothyroxine (  SYNTHROID, LEVOTHROID) 50 MCG tablet Take 50 mcg by mouth daily before breakfast.     lidocaine (XYLOCAINE) 2 % solution SMARTSIG:3-5 Milliliter(s) By Mouth Every 4-6 Hours PRN     lidocaine-prilocaine (EMLA) cream Apply 1 application topically as needed. 30 g 0   LORazepam (ATIVAN) 0.5 MG tablet Take 1 tablet prior to phlebotomy 2 tablet 0   losartan (COZAAR) 100 MG tablet Take 100 mg by mouth daily.     milk thistle 175 MG tablet Take 250 mg by mouth  daily.     Multiple Vitamin (MULTIVITAMIN) tablet Take 1 tablet by mouth daily.     Potassium Gluconate 595 MG CAPS Take by mouth.     rosuvastatin (CRESTOR) 10 MG tablet Take 10 mg by mouth at bedtime.     vitamin B-12 (CYANOCOBALAMIN) 1000 MCG tablet Take 1,000 mcg by mouth daily.     Current Facility-Administered Medications  Medication Dose Route Frequency Provider Last Rate Last Admin   sodium chloride 0.9 % injection 250 mL  250 mL Intravenous Once Burns Spain, MD        PHYSICAL EXAMINATION: ECOG PERFORMANCE STATUS: {CHL ONC ECOG XI:[id number]}  There were no vitals filed for this visit. There were no vitals filed for this visit.  GENERAL:alert, no distress and comfortable SKIN: skin color, texture, turgor are normal, no rashes or significant lesions EYES: normal, Conjunctiva are pink and non-injected, sclera clear OROPHARYNX:no exudate, no erythema and lips, buccal mucosa, and tongue normal  NECK: supple, thyroid normal size, non-tender, without nodularity LYMPH:  no palpable lymphadenopathy in the cervical, axillary or inguinal LUNGS: clear to auscultation and percussion with normal breathing effort HEART: regular rate & rhythm and no murmurs and no lower extremity edema ABDOMEN:abdomen soft, non-tender and normal bowel sounds Musculoskeletal:no cyanosis of digits and no clubbing  NEURO: alert & oriented x 3 with fluent speech, no focal motor/sensory deficits  LABORATORY DATA:  I have reviewed the data as listed CBC Latest Ref Rng & Units 12/01/2020 05/29/2020 01/25/2020  WBC 4.0 - 10.5 K/uL 4.5 2.7(L) 3.3(L)  Hemoglobin 12.0 - 15.0 g/dL 11.1(L) 10.6(L) 11.0(L)  Hematocrit 36.0 - 46.0 % 32.2(L) 30.5(L) 31.7(L)  Platelets 150 - 400 K/uL 182 130(L) 141(L)     CMP Latest Ref Rng & Units 05/24/2019 02/12/2019 10/17/2018  Glucose 70 - 99 mg/dL 88 96 349(Z)  BUN 8 - 23 mg/dL 12 11 12   Creatinine 0.44 - 1.00 mg/dL ) 7.91(T) 0.56(P)  Sodium 135 - 145 mmol/L 134(L)  137 137  Potassium 3.5 - 5.1 mmol/L 4.5 4.6 4.3  Chloride 98 - 111 mmol/L 103 102 104  CO2 22 - 32 mmol/L 20(L) 20 24  Calcium 8.9 - 10.3 mg/dL 9.3 9.9 9.4  Total Protein 6.5 - 8.1 g/dL 7.7 6.9 7.5  Total Bilirubin 0.3 - 1.2 mg/dL 0.3 0.3 1.0  Alkaline Phos 38 - 126 U/L 64 56 49  AST 15 - 41 U/L 53(H) 35 56(H)  ALT 0 - 44 U/L 24 24 32      RADIOGRAPHIC STUDIES: I have personally reviewed the radiological images as listed and agreed with the findings in the report. No results found.   ASSESSMENT & PLAN:  No problem-specific Assessment & Plan notes found for this encounter.   No orders of the defined types were placed in this encounter.  All questions were answered. The patient knows to call the clinic with any problems, questions or concerns. No barriers to learning was detected. I spent {CHL ONC  TIME VISIT - GDJME:[id number]} counseling the patient face to face. The total time spent in the appointment was {CHL ONC TIME VISIT - WLNLG:[id number]} and more than 50% was on counseling and review of test results     Pollyann Samples, NP 05/27/21

## 2021-05-27 NOTE — Telephone Encounter (Signed)
Rescheduled upcoming appointment due to provider not in office. Patient's husband is aware of changes.

## 2021-05-28 ENCOUNTER — Ambulatory Visit: Payer: Medicare Other | Admitting: Nurse Practitioner

## 2021-05-28 ENCOUNTER — Other Ambulatory Visit: Payer: Medicare Other

## 2021-05-29 ENCOUNTER — Inpatient Hospital Stay (HOSPITAL_BASED_OUTPATIENT_CLINIC_OR_DEPARTMENT_OTHER): Payer: Medicare Other | Admitting: Hematology

## 2021-05-29 ENCOUNTER — Encounter: Payer: Self-pay | Admitting: Hematology

## 2021-05-29 ENCOUNTER — Inpatient Hospital Stay: Payer: Medicare Other | Attending: Hematology

## 2021-05-29 ENCOUNTER — Other Ambulatory Visit: Payer: Self-pay

## 2021-05-29 DIAGNOSIS — N183 Chronic kidney disease, stage 3 unspecified: Secondary | ICD-10-CM | POA: Insufficient documentation

## 2021-05-29 DIAGNOSIS — M549 Dorsalgia, unspecified: Secondary | ICD-10-CM | POA: Diagnosis not present

## 2021-05-29 DIAGNOSIS — Z7952 Long term (current) use of systemic steroids: Secondary | ICD-10-CM | POA: Diagnosis not present

## 2021-05-29 DIAGNOSIS — I10 Essential (primary) hypertension: Secondary | ICD-10-CM | POA: Diagnosis not present

## 2021-05-29 DIAGNOSIS — D539 Nutritional anemia, unspecified: Secondary | ICD-10-CM | POA: Insufficient documentation

## 2021-05-29 DIAGNOSIS — Z79899 Other long term (current) drug therapy: Secondary | ICD-10-CM | POA: Diagnosis not present

## 2021-05-29 DIAGNOSIS — F17210 Nicotine dependence, cigarettes, uncomplicated: Secondary | ICD-10-CM | POA: Diagnosis not present

## 2021-05-29 DIAGNOSIS — E039 Hypothyroidism, unspecified: Secondary | ICD-10-CM | POA: Diagnosis not present

## 2021-05-29 LAB — CMP (CANCER CENTER ONLY)
ALT: 88 U/L — ABNORMAL HIGH (ref 0–44)
AST: 154 U/L — ABNORMAL HIGH (ref 15–41)
Albumin: 4.1 g/dL (ref 3.5–5.0)
Alkaline Phosphatase: 95 U/L (ref 38–126)
Anion gap: 12 (ref 5–15)
BUN: 14 mg/dL (ref 8–23)
CO2: 19 mmol/L — ABNORMAL LOW (ref 22–32)
Calcium: 9.1 mg/dL (ref 8.9–10.3)
Chloride: 101 mmol/L (ref 98–111)
Creatinine: 1.25 mg/dL — ABNORMAL HIGH (ref 0.44–1.00)
GFR, Estimated: 45 mL/min — ABNORMAL LOW (ref >60)
Glucose, Bld: 93 mg/dL (ref 70–99)
Potassium: 4.4 mmol/L (ref 3.5–5.1)
Sodium: 132 mmol/L — ABNORMAL LOW (ref 135–145)
Total Bilirubin: 0.6 mg/dL (ref 0.3–1.2)
Total Protein: 7.7 g/dL (ref 6.5–8.1)

## 2021-05-29 LAB — FERRITIN: Ferritin: 500 ng/mL — ABNORMAL HIGH (ref 11–307)

## 2021-05-29 LAB — CBC WITH DIFFERENTIAL (CANCER CENTER ONLY)
Abs Immature Granulocytes: 0.01 10*3/uL (ref 0.00–0.07)
Basophils Absolute: 0 10*3/uL (ref 0.0–0.1)
Basophils Relative: 0 %
Eosinophils Absolute: 0 10*3/uL (ref 0.0–0.5)
Eosinophils Relative: 1 %
HCT: 31.1 % — ABNORMAL LOW (ref 36.0–46.0)
Hemoglobin: 10.9 g/dL — ABNORMAL LOW (ref 12.0–15.0)
Immature Granulocytes: 0 %
Lymphocytes Relative: 22 %
Lymphs Abs: 0.9 10*3/uL (ref 0.7–4.0)
MCH: 35.9 pg — ABNORMAL HIGH (ref 26.0–34.0)
MCHC: 35 g/dL (ref 30.0–36.0)
MCV: 102.3 fL — ABNORMAL HIGH (ref 80.0–100.0)
Monocytes Absolute: 0.4 10*3/uL (ref 0.1–1.0)
Monocytes Relative: 10 %
Neutro Abs: 2.7 10*3/uL (ref 1.7–7.7)
Neutrophils Relative %: 67 %
Platelet Count: 170 10*3/uL (ref 150–400)
RBC: 3.04 MIL/uL — ABNORMAL LOW (ref 3.87–5.11)
RDW: 12.6 % (ref 11.5–15.5)
WBC Count: 4.1 10*3/uL (ref 4.0–10.5)
nRBC: 0 % (ref 0.0–0.2)

## 2021-05-29 LAB — IRON AND TIBC
Iron: 142 ug/dL (ref 41–142)
Saturation Ratios: 54 % (ref 21–57)
TIBC: 264 ug/dL (ref 236–444)
UIBC: 121 ug/dL (ref 120–384)

## 2021-05-29 NOTE — Progress Notes (Signed)
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  INTERVAL HISTORY:  Catherine Harrington is here for a follow up of hemochromatosis. She was last seen by me a year ago. She presents to the clinic alone, in a wheelchair. She notes she dislikes receiving the phlebotomies because they hurt. She reports she has severe back pain. She notes she receives injections to ease the pain. She also reports she experienced an instance of her leg falling asleep with subsequent knee swelling when she woke up the next day. She notes a skin lesion to her forehead, for which she has been given a cream to use.  All other systems were reviewed with the patient and are negative.  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   Hypertension    Iron deficiency anemia    Lumbar disc disease    Lupus (HCC)    "just the rash and arthritis" (08/16/2013)   Macrocytic anemia 06/29/2016   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/18/2016   Scoliosis    Syncope and collapse    "lost consciousness ~ 4-5 seconds; didn't hurt myself" (08/16/2013)    SURGICAL HISTORY: Past Surgical History:  Procedure Laterality Date   CATARACT EXTRACTION Bilateral 10/19 11/19   CESAREAN  SECTION  1967; 1971   MOUTH SURGERY  2013   " infection under bridge cleaned out, etc" (08/16/2013)   TUBAL LIGATION      I have reviewed the social history and family history with the patient and they are unchanged from previous note.  ALLERGIES:  is allergic to prednisone, sulfonamide derivatives, epinephrine, and midol [ibuprofen].  MEDICATIONS:  Current Outpatient Medications  Medication Sig Dispense Refill   baclofen (LIORESAL) 10 MG tablet Take 1/2 to 1 by mouth every 8hrs as needed for spasm 60 tablet 0   calcium carbonate (OS-CAL) 600 MG TABS Take 600 mg by mouth 2 (two) times daily with a meal.     CREON 24000-76000 units CPEP      dexamethasone 0.5 MG/5ML elixir SWISH 5 MLS IN THE MOUTH FOR 3 MINUTES THEN SPIT THREE TIMES DAILY FOR 3 WEEKS     diazepam (VALIUM) 5 MG tablet Take 1 by mouth 1 hour  pre-procedure with very light food. May bring 2nd tablet to appointment. 2 tablet 0   doxazosin (CARDURA) 8 MG tablet Take 8 mg by mouth at bedtime.     doxycycline (VIBRAMYCIN) 100 MG capsule Take 1 capsule (100 mg total) by mouth 2 (two) times daily. 20 capsule 0   fexofenadine (ALLEGRA) 180 MG tablet Take 180 mg by mouth daily.     gabapentin (NEURONTIN) 300 MG capsule TAKE 1 CAPSULE(300 MG) BY MOUTH AT BEDTIME 90 capsule 3   hydrOXYzine (ATARAX/VISTARIL) 10 MG tablet Take by mouth.  levothyroxine (SYNTHROID, LEVOTHROID) 50 MCG tablet Take 50 mcg by mouth daily before breakfast.     lidocaine (XYLOCAINE) 2 % solution SMARTSIG:3-5 Milliliter(s) By Mouth Every 4-6 Hours PRN     lidocaine-prilocaine (EMLA) cream Apply 1 application topically as needed. 30 g 0   LORazepam (ATIVAN) 0.5 MG tablet Take 1 tablet prior to phlebotomy 2 tablet 0   losartan (COZAAR) 100 MG tablet Take 100 mg by mouth daily.     milk thistle 175 MG tablet Take 250 mg by mouth daily.     Multiple Vitamin (MULTIVITAMIN) tablet Take 1 tablet by mouth daily.     Potassium Gluconate 595 MG CAPS Take by mouth.      rosuvastatin (CRESTOR) 10 MG tablet Take 10 mg by mouth at bedtime.     vitamin B-12 (CYANOCOBALAMIN) 1000 MCG tablet Take 1,000 mcg by mouth daily.     Current Facility-Administered Medications  Medication Dose Route Frequency Provider Last Rate Last Admin   sodium chloride 0.9 % injection 250 mL  250 mL Intravenous Once Burns Spain, MD        PHYSICAL EXAMINATION: ECOG PERFORMANCE STATUS: 2 - Symptomatic, <50% confined to bed  Vitals:   05/29/21 1419  BP: (!) 126/56  Pulse: 89  Resp: 18  Temp: 98.1 F (36.7 C)  SpO2: 98%   Filed Weights   05/29/21 1419  Weight: 91 lb 8 oz (41.5 kg)    Due to COVID19 we will limit examination to appearance. Patient had no complaints.  GENERAL:alert, no distress and comfortable SKIN: skin color normal, no rashes or significant lesions EYES: normal, Conjunctiva are pink and non-injected, sclera clear  NEURO: alert & oriented x 3 with fluent speech  LABORATORY DATA:  I have reviewed the data as listed CBC Latest Ref Rng & Units 05/29/2021 12/01/2020 05/29/2020  WBC 4.0 - 10.5 K/uL 4.1 4.5 2.7(L)  Hemoglobin 12.0 - 15.0 g/dL 10.9(L) 11.1(L) 10.6(L)  Hematocrit 36.0 - 46.0 % 31.1(L) 32.2(L) 30.5(L)  Platelets 150 - 400 K/uL 170 182 130(L)     CMP Latest Ref Rng & Units 05/29/2021 05/24/2019 02/12/2019  Glucose 70 - 99 mg/dL 93 88 96  BUN 8 - 23 mg/dL 14 12 11   Creatinine 0.44 - 1.00 mg/dL ) 6.50(P) 5.46(F)  Sodium 135 - 145 mmol/L 132(L) 134(L) 137  Potassium 3.5 - 5.1 mmol/L 4.4 4.5 4.6  Chloride 98 - 111 mmol/L 101 103 102  CO2 22 - 32 mmol/L 19(L) 20(L) 20  Calcium 8.9 - 10.3 mg/dL 9.1 9.3 9.9  Total Protein 6.5 - 8.1 g/dL 7.7 7.7 6.9  Total Bilirubin 0.3 - 1.2 mg/dL 0.6 0.3 0.3  Alkaline Phos 38 - 126 U/L 95 64 56  AST 15 - 41 U/L 154(H) 53(H) 35  ALT 0 - 44 U/L 88(H) 24 24      RADIOGRAPHIC STUDIES: I have personally reviewed the radiological images as listed and agreed with the findings in the report. No results  found.   ASSESSMENT & PLAN:  [name] is a 75 y.o. female with   1. 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  She never had liver biopsy.  -The interpretation of the iron study is confounded by an underlying lupus which might lead to chronic inflammation and potentially autoimmune hepatitis, and possible coexisting liver disease from long-standing alcohol use. She  was also on chronic iron supplement due to her anemia. Most people with C282Y heterozygous does not presented with iron overload and hemochromatosis.  To evaluate iron deposition, and decide she needs chronic phlebotomy.  Due to her longstanding mild anemia, phlebotomy is difficult on her. She is not able to afford iron chelating agent  -Her MRI from 02/23/20 shows mild hepatomegaly but no iron overload in liver or other organs. -US Abdomen 01/02/21 showed heterogeneous, subtly echogenic appearance of liver without focal liver lesion. -She is receiving Phlebotomies as needed. Given her anemia will give half unit Phlebotomy if Ferritin above 300.  -She sees Dr. Elsie Stain about every 6 months. -Labs reviewed, Cr 1.25, AST 154, ALT 88 (05/29/21). Iron panel still pending. No need for phlebotomy today if ferritin below 300. She really does not want to receive any further phlebotomies because of the pain the injection causes. She is aware of how to properly use emla cream and steps taken to prevent the pain, but she still experiences it. -I again recommended she take vit b12 and a multivitamin without iron. She is aware of this. -F/u in 1 year.    2. Chronic macrocytic anemia -Macrocytosis may be secondary to previous chronic alcohol use and hypothyroidism. Normal B12 and folic acid.  Myelodysplastic syndrome not excluded but felt to be  unlikely given stable blood counts  -Labs reviewed today, Hgb 10.9, HCT 31.1%, MCV 102.3, MCH 35.9 (05/29/21), stable from a year ago. Iron panel pending.   3. 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 -On baclofen and Gabapentin for pain, stable -On Levothyroxine and losartan -She smokes about 1 ppd. Cessation has been previously discussed and advised.  -She follows Dr. Alvester Morin for her arthritis and receives injections as needed. -Most recent lung cancer screening 04/14/21 was benign   4. Possible abdominal Hernia  -Surgery was not recommended by general surgeon.      PLAN:  -Will call her with ferritin level, will schedule phlebotomy if ferritin 300 or more -f/u with Dr. Elsie Stain as indicated -labs in 6 months and 1 year, with f/u in 1 year   No problem-specific Assessment & Plan notes found for this encounter.   No orders of the defined types were placed in this encounter.  All questions were answered. The patient knows to call the clinic with any problems, questions or concerns. No barriers to learning was detected. The total time spent in the appointment was 20 minutes.     Malachy Mood, MD 05/29/2021   I, Mickie Bail, am acting as scribe for Malachy Mood, MD.   I have reviewed the above documentation for accuracy and completeness, and I agree with the above.

## 2021-06-02 ENCOUNTER — Telehealth: Payer: Self-pay | Admitting: Physical Medicine and Rehabilitation

## 2021-06-02 NOTE — Telephone Encounter (Signed)
Pt called and is wondering if she needs to have someone come with her on 7/11 Monday?  CB [phone]

## 2021-06-03 ENCOUNTER — Other Ambulatory Visit: Payer: Self-pay | Admitting: Physical Medicine and Rehabilitation

## 2021-06-03 MED ORDER — DIAZEPAM 5 MG PO TABS: ORAL_TABLET | ORAL | 0 refills | Status: DC

## 2021-06-08 ENCOUNTER — Ambulatory Visit (INDEPENDENT_AMBULATORY_CARE_PROVIDER_SITE_OTHER): Payer: Medicare Other | Admitting: Physical Medicine and Rehabilitation

## 2021-06-08 ENCOUNTER — Ambulatory Visit: Payer: Self-pay

## 2021-06-08 ENCOUNTER — Other Ambulatory Visit: Payer: Self-pay

## 2021-06-08 ENCOUNTER — Encounter: Payer: Self-pay | Admitting: Physical Medicine and Rehabilitation

## 2021-06-08 VITALS — BP 103/54 | HR 83

## 2021-06-08 DIAGNOSIS — I6521 Occlusion and stenosis of right carotid artery: Secondary | ICD-10-CM | POA: Diagnosis not present

## 2021-06-08 DIAGNOSIS — M4156 Other secondary scoliosis, lumbar region: Secondary | ICD-10-CM

## 2021-06-08 DIAGNOSIS — M7631 Iliotibial band syndrome, right leg: Secondary | ICD-10-CM | POA: Diagnosis not present

## 2021-06-08 DIAGNOSIS — M5416 Radiculopathy, lumbar region: Secondary | ICD-10-CM

## 2021-06-08 DIAGNOSIS — M25561 Pain in right knee: Secondary | ICD-10-CM | POA: Diagnosis not present

## 2021-06-08 DIAGNOSIS — G8929 Other chronic pain: Secondary | ICD-10-CM | POA: Diagnosis not present

## 2021-06-08 DIAGNOSIS — M48061 Spinal stenosis, lumbar region without neurogenic claudication: Secondary | ICD-10-CM | POA: Diagnosis not present

## 2021-06-08 MED ORDER — METHYLPREDNISOLONE ACETATE 80 MG/ML IJ SUSP
80.0000 mg | Freq: Once | INTRAMUSCULAR | Status: AC
Start: 2021-06-08 — End: 2021-06-08
  Administered 2021-06-08: 80 mg

## 2021-06-08 NOTE — Progress Notes (Signed)
Catherine Harrington - 75 y.o. female MRN [id number]  Date of birth: 06-26-1946  Office Visit Note: Visit Date: 06/08/2021 PCP: Cleatis Polka., MD Referred by: Cleatis Polka., MD  Subjective: Chief Complaint  Patient presents with   Lower Back - Pain   Right Leg - Pain   HPI:  Catherine Harrington is a 75 y.o. female who comes in today For evaluation and management of chronic worsening severe right low back and buttock pain with some referral down the posterior lateral right leg.  She reports 8 out of 10 pain into the right buttocks with out specific injury.  This been a chronic issue with her.  She has had multiple medical issues and pretty bad medical problems lately with hemochromatosis and liver fibrosis and history of discoid lupus.  Her spine is significant for degenerative scoliosis particular at the L3-4 level.  Her symptoms today are pretty classic L5 distribution.  Interestingly though she also has some pain down to the outer part of the right knee.  She has had history recently of knee swelling on 2 occasions and it seems to go down on its own.  She saw Dr. Lavada Mesi for evaluation and at the time he felt like she was having some systemic type problem and he did start her on an antibiotic that she did not tolerate very well at all.  More recently she had a recent flareup of the knee which really put her out for a few days.  She is unable to take prednisone but does okay with cortisone injections.  She is not had any left knee issues.  No locking or instability of the right knee just swelling at times.  This has been on rare occasions but very painful.  She is not noted any new weakness or foot drop.  No bowel or bladder changes etc.  It does affect her daily living she has a hard time doing a lot of things she like to do.  She reports worsening with standing and walking.  She is done well in the past with radiofrequency ablation for more back pain but just not this buttock pain.  In the  past she is also done well with physical therapy with Aaart Schulenklopper, DPT.  Review of Systems  Constitutional:  Positive for malaise/fatigue and weight loss.  Musculoskeletal:  Positive for back pain and joint pain.  All other systems reviewed and are negative. Otherwise per HPI.  Assessment & Plan: Visit Diagnoses:    ICD-10-CM   1. Lumbar radiculopathy  M54.16 XR C-ARM NO REPORT    Epidural Steroid injection    methylPREDNISolone acetate (DEPO-MEDROL) injection 80 mg    2. Other secondary scoliosis, lumbar region  M41.56 XR C-ARM NO REPORT    Epidural Steroid injection    methylPREDNISolone acetate (DEPO-MEDROL) injection 80 mg    3. Foraminal stenosis of lumbar region  M48.061 XR C-ARM NO REPORT    Epidural Steroid injection    methylPREDNISolone acetate (DEPO-MEDROL) injection 80 mg    4. Chronic pain of right knee  M25.561    G89.29     5. Iliotibial band syndrome of right side  M76.31       Plan: Findings:  1.  Chronic worsening severe low back pain with referral into the right hip and right lateral leg.  This seems to be most consistent with an L5 radiculitis radiculopathy.  She has lateral recess narrowing at L4-5 but also foraminal narrowing at that level.  Interestingly even though she has significant degenerative scoliosis she does not have a lot of central canal narrowing.  No focal disc herniation.  MRI was updated more recently and shows this.  I do think from a diagnostic and hopefully therapeutic standpoint because of the severity of her symptoms we will complete a right L5 transforaminal epidural steroid injection today.  Some of her symptoms can be consistent with iliotibial band syndrome on the right.  Please see note below for the knee pain.  In terms of her back we have asked her to keep active and staying strong.  She has some problems with a lot of medical issues right now this really can be getting in the way of everything.  She will continue with current  medications.  She is intolerant of some medications in general.  She has had radiofrequency ablation in the past and that seems to have helped her low back pain but not the pain across the buttock.  Consider regrouping with Lyda Jester, DPT at [facility] physical therapy.  2.  Right knee pain and swelling at times.  Today its not swollen but it is tender on the lateral aspect of the knee.  No effusion on exam she has good stability.  No joint line tenderness except laterally.  This could be over the time and of the iliotibial band.  I do still think the amount of pain she is having would be from an iliotibial band syndrome but cannot rule that out.  I have asked her to use Voltaren gel on that spot 3 times a day for the next couple weeks.  Depending on how she does with this knee and if it swells up again I will have her follow-up for consultation with Dr. Burnard Bunting.   Meds & Orders:  Meds ordered this encounter  Medications   methylPREDNISolone acetate (DEPO-MEDROL) injection 80 mg    Orders Placed This Encounter  Procedures   XR C-ARM NO REPORT   Epidural Steroid injection    Follow-up: Return if symptoms worsen or fail to improve.   Procedures: No procedures performed  Lumbosacral Transforaminal Epidural Steroid Injection - Sub-Pedicular Approach with Fluoroscopic Guidance  Patient: Catherine Harrington      Date of Birth: 04/27/1946 MRN: [id number] PCP: Cleatis Polka., MD      Visit Date: 06/08/2021   Universal Protocol:    Date/Time: 06/08/2021  Consent Given By: the patient  Position: PRONE  Additional Comments: Vital signs were monitored before and after the procedure. Patient was prepped and draped in the usual sterile fashion. The correct patient, procedure, and site was verified.   Injection Procedure Details:   Procedure diagnoses: Lumbar radiculopathy [M54.16]    Meds Administered:  Meds ordered this encounter  Medications   methylPREDNISolone  acetate (DEPO-MEDROL) injection 80 mg    Laterality: Right  Location/Site:  L4-L5  Needle:5.0 in., 22 ga.  Short bevel or Quincke spinal needle  Needle Placement: Transforaminal  Findings:    -Comments: Excellent flow of contrast along the nerve, nerve root and into the epidural space.  Procedure Details: After squaring off the end-plates to get a true AP view, the C-arm was positioned so that an oblique view of the foramen as noted above was visualized. The target area is just inferior to the "nose of the scotty dog" or sub pedicular. The soft tissues overlying this structure were infiltrated with 2-3 ml. of 1% Lidocaine without Epinephrine.  The spinal needle was inserted toward the  target using a "trajectory" view along the fluoroscope beam.  Under AP and lateral visualization, the needle was advanced so it did not puncture dura and was located close the 6 O'Clock position of the pedical in AP tracterory. Biplanar projections were used to confirm position. Aspiration was confirmed to be negative for CSF and/or blood. A 1-2 ml. volume of Isovue-250 was injected and flow of contrast was noted at each level. Radiographs were obtained for documentation purposes.   After attaining the desired flow of contrast documented above, a 0.5 to 1.0 ml test dose of 0.25% Marcaine was injected into each respective transforaminal space.  The patient was observed for 90 seconds post injection.  After no sensory deficits were reported, and normal lower extremity motor function was noted,   the above injectate was administered so that equal amounts of the injectate were placed at each foramen (level) into the transforaminal epidural space.   Additional Comments:  The patient tolerated the procedure well Dressing: 2 x 2 sterile gauze and Band-Aid    Post-procedure details: Patient was observed during the procedure. Post-procedure instructions were reviewed.  Patient left the clinic in stable  condition.    Clinical History: MRI LUMBAR SPINE WITHOUT CONTRAST   TECHNIQUE: Multiplanar, multisequence MR imaging of the lumbar spine was performed. No intravenous contrast was administered.   COMPARISON:  05/09/2017   FINDINGS: Segmentation: 5 lumbar type vertebrae based on the available coverage   Alignment: Marked dextroscoliosis with rightward translation at L4-5. The curvature is centered at the level of L3.   Vertebrae: Degenerative fatty marrow conversion at L2-L5 primarily. No fracture, discitis, or aggressive bone lesion   Conus medullaris and cauda equina: Conus extends to the T12 level. Conus and cauda equina appear normal.   Paraspinal and other soft tissues: Marked muscular atrophy involving the intrinsic back muscles and bilateral psoas. No incidental inflammation or mass is seen about the spine.   Disc levels:   T12- L1: Unremarkable.   L1-L2: Disc collapse and endplate degeneration with ridging. No compressive spinal or foraminal stenosis   L2-L3: Asymmetric leftward disc collapse. Degenerative facet spurring asymmetric to the left. The canal and foramina are patent. The left subarticular recess is effaced.   L3-L4: Disc collapse asymmetric to the left. Intervertebral ankylosis has occurred remotely. Facet spurring asymmetric to the left where there is moderate foraminal narrowing. Left subarticular recess narrowing that could affect the L4 nerve root.   L4-L5: Disc collapse and endplate degeneration. Degenerative facet spurring asymmetric to the right. Advanced right foraminal stenosis. Right subarticular recess narrowing that could certainly affect the L5 nerve root. More moderate left subarticular recess narrowing.   L5-S1:Degenerative facet spurring with small joint effusions. Preserved disc height and hydration compared to the other levels. No neural compression.   IMPRESSION: 1. No acute or interval finding when compared to 2018. 2.  Advanced lumbar spine degeneration and dextroscoliosis. 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 3. L4-5 right foraminal and subarticular recess impingement. 4. On the left there is subarticular recess stenosis at L2-3 to L4-5. 5. Advanced and generalized muscular atrophy.     Electronically Signed   By: Marnee SpringJonathon  Watts M.D.   On: 09/12/2020 18:55     Objective:  VS:  HT:    WT:   BMI:     BP:(!) 103/54  HR:83bpm  TEMP: ( )  RESP:  Physical Exam Vitals and nursing note reviewed.  Constitutional:      General: She is not in acute distress.  Appearance: Normal appearance. She is not ill-appearing.     Comments: Very thin appearing  HENT:     Head: Normocephalic and atraumatic.     Right Ear: External ear normal.     Left Ear: External ear normal.  Eyes:     Extraocular Movements: Extraocular movements intact.  Cardiovascular:     Rate and Rhythm: Normal rate.     Pulses: Normal pulses.  Pulmonary:     Effort: Pulmonary effort is normal. No respiratory distress.  Abdominal:     General: There is no distension.     Palpations: Abdomen is soft.  Musculoskeletal:        General: Tenderness present.     Cervical back: Neck supple.     Right lower leg: No edema.     Left lower leg: No edema.     Comments: Patient has good distal strength with no pain over the greater trochanters.  No clonus or focal weakness.  She stands with obvious leftward lumbar scoliosis.  She does have pain with palpating over the right PSIS and there is focal trigger point pain here as well.  She has pain over the right lateral knee.  This seems to be the iliotibial band insertion.  She has no pain across the joint line.  No effusion bilaterally.  No clicking or locking.  Good stability.  She does stand with a forward flexed lumbar spine and some pain with trying to extend.  She has a negative slump test although she has a lot of pain just in general.  Skin:    General: Skin is dry.      Coloration: Skin is not pale.     Findings: No erythema, lesion or rash.  Neurological:     General: No focal deficit present.     Mental Status: She is alert and oriented to person, place, and time.     Sensory: No sensory deficit.     Motor: No weakness or abnormal muscle tone.     Coordination: Coordination normal.     Gait: Gait abnormal.  Psychiatric:        Mood and Affect: Mood normal.        Behavior: Behavior normal.     Imaging: XR C-ARM NO REPORT  Result Date: 06/08/2021 Please see Notes tab for imaging impression.

## 2021-06-08 NOTE — Procedures (Signed)
Lumbosacral Transforaminal Epidural Steroid Injection - Sub-Pedicular Approach with Fluoroscopic Guidance  Patient: Catherine Harrington      Date of Birth: 08/22/1946 MRN: [id number] PCP: Cleatis Polka., MD      Visit Date: 06/08/2021   Universal Protocol:    Date/Time: 06/08/2021  Consent Given By: the patient  Position: PRONE  Additional Comments: Vital signs were monitored before and after the procedure. Patient was prepped and draped in the usual sterile fashion. The correct patient, procedure, and site was verified.   Injection Procedure Details:   Procedure diagnoses: Lumbar radiculopathy [M54.16]    Meds Administered:  Meds ordered this encounter  Medications   methylPREDNISolone acetate (DEPO-MEDROL) injection 80 mg    Laterality: Right  Location/Site:  L4-L5  Needle:5.0 in., 22 ga.  Short bevel or Quincke spinal needle  Needle Placement: Transforaminal  Findings:    -Comments: Excellent flow of contrast along the nerve, nerve root and into the epidural space.  Procedure Details: After squaring off the end-plates to get a true AP view, the C-arm was positioned so that an oblique view of the foramen as noted above was visualized. The target area is just inferior to the "nose of the scotty dog" or sub pedicular. The soft tissues overlying this structure were infiltrated with 2-3 ml. of 1% Lidocaine without Epinephrine.  The spinal needle was inserted toward the target using a "trajectory" view along the fluoroscope beam.  Under AP and lateral visualization, the needle was advanced so it did not puncture dura and was located close the 6 O'Clock position of the pedical in AP tracterory. Biplanar projections were used to confirm position. Aspiration was confirmed to be negative for CSF and/or blood. A 1-2 ml. volume of Isovue-250 was injected and flow of contrast was noted at each level. Radiographs were obtained for documentation purposes.   After attaining the  desired flow of contrast documented above, a 0.5 to 1.0 ml test dose of 0.25% Marcaine was injected into each respective transforaminal space.  The patient was observed for 90 seconds post injection.  After no sensory deficits were reported, and normal lower extremity motor function was noted,   the above injectate was administered so that equal amounts of the injectate were placed at each foramen (level) into the transforaminal epidural space.   Additional Comments:  The patient tolerated the procedure well Dressing: 2 x 2 sterile gauze and Band-Aid    Post-procedure details: Patient was observed during the procedure. Post-procedure instructions were reviewed.  Patient left the clinic in stable condition.

## 2021-06-08 NOTE — Patient Instructions (Signed)

## 2021-06-08 NOTE — Progress Notes (Signed)
Pt state lower back pain that travels to her right buttocks and down her right leg. Pt state sitting, walking and standing makes the pain worse. Pt state she takes pain meds and pain patches to help ease her pain. Pt has hx of inj on 08/07/20 pt state it helped.  Numeric Pain Rating Scale and Functional Assessment Average Pain 8   In the last MONTH (on 0-10 scale) has pain interfered with the following?  1. General activity like being  able to carry out your everyday physical activities such as walking, climbing stairs, carrying groceries, or moving a chair?  Rating(10)   +Driver, -BT, -Dye Allergies.

## 2021-06-09 ENCOUNTER — Encounter: Payer: Self-pay | Admitting: Physical Medicine and Rehabilitation

## 2021-06-24 ENCOUNTER — Telehealth: Payer: Self-pay

## 2021-06-24 NOTE — Telephone Encounter (Signed)
Pt called and would like a call back regarding a RX refill

## 2021-06-25 NOTE — Telephone Encounter (Signed)
Mailed to patient

## 2021-06-25 NOTE — Telephone Encounter (Signed)
Patient requests referral to [facility].

## 2021-06-30 ENCOUNTER — Ambulatory Visit (INDEPENDENT_AMBULATORY_CARE_PROVIDER_SITE_OTHER): Payer: Medicare Other | Admitting: Podiatry

## 2021-06-30 ENCOUNTER — Other Ambulatory Visit: Payer: Self-pay

## 2021-06-30 ENCOUNTER — Encounter: Payer: Self-pay | Admitting: Podiatry

## 2021-06-30 DIAGNOSIS — N182 Chronic kidney disease, stage 2 (mild): Secondary | ICD-10-CM

## 2021-06-30 DIAGNOSIS — B351 Tinea unguium: Secondary | ICD-10-CM

## 2021-06-30 DIAGNOSIS — M79674 Pain in right toe(s): Secondary | ICD-10-CM | POA: Diagnosis not present

## 2021-06-30 DIAGNOSIS — Q828 Other specified congenital malformations of skin: Secondary | ICD-10-CM

## 2021-06-30 DIAGNOSIS — M79675 Pain in left toe(s): Secondary | ICD-10-CM

## 2021-06-30 NOTE — Progress Notes (Signed)
This patient returns to my office for at risk foot care.  This patient requires this care by a professional since this patient will be at risk due to having chronic kidney disease stage  3.  Patient says the callus are painful walking and wearing her shoes.  This patient is uable to trim her callus since she cannot reach her feer due to back problems.  This patient presents for at risk foot care today.  General Appearance  Alert, conversant and in no acute stress.  Vascular  Dorsalis pedis and posterior tibial  pulses are palpable  bilaterally.  Capillary return is within normal limits  bilaterally. Temperature is within normal limits  bilaterally.  Neurologic  Senn-Weinstein monofilament wire test within normal limits  bilaterally. Muscle power within normal limits bilaterally.  Nails Thick disfigured discolored nails with subungual debris  from hallux to fifth toes bilaterally. No evidence of bacterial infection or drainage bilaterally.  Orthopedic  No limitations of motion  feet .  No crepitus or effusions noted.  No bony pathology or digital deformities noted.  Enlarged and painful tibial sesamoid right foot.  Skin  normotropic skin   noted bilaterally.  No signs of infections or ulcers noted.   Porokeratosis sub 1 right foot.  Callus left hallux.  Porokeratosis  B/L.  Sesamoiditis sub 1st MPJ right foot.  Consent was obtained for treatment procedures.   Debridement of calluses both feet with a # 15 blade.  Patient has acquired gel pads which help with her pain.    Return office visit    10 weeks                   Told patient to return for periodic foot care and evaluation due to potential at risk complications.   Helane Gunther DPM

## 2021-08-20 ENCOUNTER — Other Ambulatory Visit: Payer: Self-pay | Admitting: Physical Medicine and Rehabilitation

## 2021-08-20 NOTE — Telephone Encounter (Signed)
Patient's husband states that the patient is requesting a refill. She has knee pain and swelling. He states that this happens about every three months and she has difficulty walking but the medication helps. They are also asking for recommendations/ possible referral to have someone look at her knee again.

## 2021-08-20 NOTE — Telephone Encounter (Signed)
Please advise 

## 2021-09-14 ENCOUNTER — Encounter: Payer: Self-pay | Admitting: Podiatry

## 2021-09-14 ENCOUNTER — Other Ambulatory Visit: Payer: Self-pay

## 2021-09-14 ENCOUNTER — Ambulatory Visit (INDEPENDENT_AMBULATORY_CARE_PROVIDER_SITE_OTHER): Payer: Medicare Other | Admitting: Podiatry

## 2021-09-14 DIAGNOSIS — N182 Chronic kidney disease, stage 2 (mild): Secondary | ICD-10-CM | POA: Diagnosis not present

## 2021-09-14 DIAGNOSIS — Q828 Other specified congenital malformations of skin: Secondary | ICD-10-CM | POA: Diagnosis not present

## 2021-09-14 NOTE — Progress Notes (Signed)
This patient returns to my office for at risk foot care.  This patient requires this care by a professional since this patient will be at risk due to having chronic kidney disease stage  3.  Patient says the callus are painful walking and wearing her shoes.  This patient is uable to trim her callus since she cannot reach her feer due to back problems.  This patient presents for at risk foot care today. ° °General Appearance  Alert, conversant and in no acute stress. ° °Vascular  Dorsalis pedis and posterior tibial  pulses are palpable  bilaterally.  Capillary return is within normal limits  bilaterally. Temperature is within normal limits  bilaterally. ° °Neurologic  Senn-Weinstein monofilament wire test within normal limits  bilaterally. Muscle power within normal limits bilaterally. ° °Nails Thick disfigured discolored nails with subungual debris  from hallux to fifth toes bilaterally. No evidence of bacterial infection or drainage bilaterally. ° °Orthopedic  No limitations of motion  feet .  No crepitus or effusions noted.  No bony pathology or digital deformities noted.  Enlarged and painful tibial sesamoid right foot. ° °Skin  normotropic skin   noted bilaterally.  No signs of infections or ulcers noted.   Porokeratosis sub 1 right foot.  Callus left hallux. ° °Porokeratosis  B/L.  Sesamoiditis sub 1st MPJ right foot. ° °Consent was obtained for treatment procedures.   Debridement of calluses both feet with a # 15 blade.  Patient has acquired gel pads which help with her pain.  ° ° °Return office visit    9   weeks                   Told patient to return for periodic foot care and evaluation due to potential at risk complications. ° ° °Diara Chaudhari DPM  °

## 2021-10-02 ENCOUNTER — Other Ambulatory Visit: Payer: Self-pay | Admitting: Cardiovascular Disease

## 2021-10-02 DIAGNOSIS — Z1231 Encounter for screening mammogram for malignant neoplasm of breast: Secondary | ICD-10-CM

## 2021-11-05 ENCOUNTER — Ambulatory Visit: Payer: Medicare Other

## 2021-11-17 ENCOUNTER — Other Ambulatory Visit: Payer: Self-pay

## 2021-11-17 ENCOUNTER — Ambulatory Visit (INDEPENDENT_AMBULATORY_CARE_PROVIDER_SITE_OTHER): Payer: Medicare Other | Admitting: Podiatry

## 2021-11-17 ENCOUNTER — Encounter: Payer: Self-pay | Admitting: Podiatry

## 2021-11-17 DIAGNOSIS — N182 Chronic kidney disease, stage 2 (mild): Secondary | ICD-10-CM | POA: Diagnosis not present

## 2021-11-17 DIAGNOSIS — Q828 Other specified congenital malformations of skin: Secondary | ICD-10-CM | POA: Diagnosis not present

## 2021-11-17 DIAGNOSIS — B351 Tinea unguium: Secondary | ICD-10-CM

## 2021-11-17 DIAGNOSIS — M79674 Pain in right toe(s): Secondary | ICD-10-CM

## 2021-11-17 DIAGNOSIS — M79675 Pain in left toe(s): Secondary | ICD-10-CM

## 2021-11-17 NOTE — Progress Notes (Signed)
This patient returns to my office for at risk foot care.  This patient requires this care by a professional since this patient will be at risk due to having chronic kidney disease stage  3.  Patient says the callus are painful walking and wearing her shoes.  This patient is uable to trim her callus since she cannot reach her feer due to back problems.  This patient presents for at risk foot care today.  General Appearance  Alert, conversant and in no acute stress.  Vascular  Dorsalis pedis and posterior tibial  pulses are palpable  bilaterally.  Capillary return is within normal limits  bilaterally. Temperature is within normal limits  bilaterally.  Neurologic  Senn-Weinstein monofilament wire test within normal limits  bilaterally. Muscle power within normal limits bilaterally.  Nails Thick disfigured discolored nails with subungual debris  from hallux to fifth toes bilaterally. No evidence of bacterial infection or drainage bilaterally.  Orthopedic  No limitations of motion  feet .  No crepitus or effusions noted.  No bony pathology or digital deformities noted.  Enlarged and painful tibial sesamoid right foot.  Skin  normotropic skin   noted bilaterally.  No signs of infections or ulcers noted.   Porokeratosis sub 1 right foot.  Callus left hallux.  Porokeratosis  B/L.  Sesamoiditis sub 1st MPJ right foot.  Consent was obtained for treatment procedures.   Debridement of calluses both feet with a # 15 blade.  Patient has acquired gel pads which help with her pain.    Return office visit    9   weeks                   Told patient to return for periodic foot care and evaluation due to potential at risk complications.   Helane Gunther DPM

## 2021-12-01 ENCOUNTER — Inpatient Hospital Stay: Payer: Medicare Other

## 2021-12-01 ENCOUNTER — Telehealth: Payer: Self-pay | Admitting: Hematology

## 2021-12-01 NOTE — Telephone Encounter (Signed)
Cancelled 01/03 lab only appointment due to patient not feeling well, patient will call to reschedule.

## 2021-12-07 ENCOUNTER — Ambulatory Visit: Payer: Medicare Other

## 2021-12-08 ENCOUNTER — Other Ambulatory Visit (INDEPENDENT_AMBULATORY_CARE_PROVIDER_SITE_OTHER): Payer: Self-pay | Admitting: Physical Medicine and Rehabilitation

## 2021-12-10 ENCOUNTER — Ambulatory Visit: Payer: Medicare Other

## 2022-02-04 ENCOUNTER — Other Ambulatory Visit: Payer: Self-pay | Admitting: Internal Medicine

## 2022-02-04 DIAGNOSIS — M542 Cervicalgia: Secondary | ICD-10-CM

## 2022-02-16 ENCOUNTER — Encounter: Payer: Self-pay | Admitting: Podiatry

## 2022-02-16 ENCOUNTER — Ambulatory Visit (INDEPENDENT_AMBULATORY_CARE_PROVIDER_SITE_OTHER): Payer: Medicare Other | Admitting: Podiatry

## 2022-02-16 ENCOUNTER — Other Ambulatory Visit: Payer: Self-pay

## 2022-02-16 DIAGNOSIS — N182 Chronic kidney disease, stage 2 (mild): Secondary | ICD-10-CM | POA: Diagnosis not present

## 2022-02-16 DIAGNOSIS — B351 Tinea unguium: Secondary | ICD-10-CM

## 2022-02-16 DIAGNOSIS — Q828 Other specified congenital malformations of skin: Secondary | ICD-10-CM | POA: Diagnosis not present

## 2022-02-16 DIAGNOSIS — M79675 Pain in left toe(s): Secondary | ICD-10-CM | POA: Diagnosis not present

## 2022-02-16 DIAGNOSIS — M79674 Pain in right toe(s): Secondary | ICD-10-CM | POA: Diagnosis not present

## 2022-02-16 NOTE — Progress Notes (Signed)
This patient returns to my office for at risk foot care.  This patient requires this care by a professional since this patient will be at risk due to having chronic kidney disease stage  3.  Patient says the callus are painful walking and wearing her shoes.  This patient is uable to trim her callus since she cannot reach her feer due to back problems.  This patient presents for at risk foot care today. ? ?General Appearance  Alert, conversant and in no acute stress. ? ?Vascular  Dorsalis pedis and posterior tibial  pulses are palpable  bilaterally.  Capillary return is within normal limits  bilaterally. Temperature is within normal limits  bilaterally. ? ?Neurologic  Senn-Weinstein monofilament wire test within normal limits  bilaterally. Muscle power within normal limits bilaterally. ? ?Nails Thick disfigured discolored nails with subungual debris hallux nails bilaterally. No evidence of bacterial infection or drainage bilaterally. ? ?Orthopedic  No limitations of motion  feet .  No crepitus or effusions noted.  No bony pathology or digital deformities noted.  Enlarged and painful tibial sesamoid right foot. ? ?Skin  normotropic skin   noted bilaterally.  No signs of infections or ulcers noted.   Porokeratosis sub 1 right foot.  Callus left hallux. ? ?Porokeratosis  B/L.  Sesamoiditis sub 1st MPJ right foot. ? ?Consent was obtained for treatment procedures.   Debridement of calluses both feet with a # 15 blade.  Patient has acquired gel pads which help with her pain. Debridement of nails with nail nipper and dremel tool.  Patient to make an appointment with Dr.  Posey Pronto for surgical consult. ? ? ?Return office visit    9   weeks                   Told patient to return for periodic foot care and evaluation due to potential at risk complications. ? ? ?Gardiner Barefoot DPM  ?

## 2022-02-17 ENCOUNTER — Ambulatory Visit
Admission: RE | Admit: 2022-02-17 | Discharge: 2022-02-17 | Disposition: A | Discharge status: Home / Self Care | Payer: Medicare Other | Source: Ambulatory Visit | Attending: Cardiovascular Disease | Admitting: Cardiovascular Disease

## 2022-02-17 DIAGNOSIS — Z1231 Encounter for screening mammogram for malignant neoplasm of breast: Secondary | ICD-10-CM

## 2022-02-21 ENCOUNTER — Ambulatory Visit
Admission: RE | Admit: 2022-02-21 | Discharge: 2022-02-21 | Disposition: A | Discharge status: Home / Self Care | Payer: Medicare Other | Source: Ambulatory Visit | Attending: Internal Medicine | Admitting: Internal Medicine

## 2022-02-21 ENCOUNTER — Other Ambulatory Visit: Payer: Self-pay

## 2022-02-21 DIAGNOSIS — M542 Cervicalgia: Secondary | ICD-10-CM

## 2022-03-10 ENCOUNTER — Ambulatory Visit (INDEPENDENT_AMBULATORY_CARE_PROVIDER_SITE_OTHER): Payer: Medicare Other | Admitting: Podiatry

## 2022-03-10 DIAGNOSIS — M216X1 Other acquired deformities of right foot: Secondary | ICD-10-CM

## 2022-03-16 NOTE — Progress Notes (Signed)
?Subjective:  ?Patient ID: Catherine Harrington, female    DOB: January 16, 1946,  MRN: [id number] ? ?Chief Complaint  ?Patient presents with  ? Callouses  ? ? ?76 y.o. female presents with the above complaint.  Patient presents with complaint of right submetatarsal 1 porokeratotic lesion.  Patient states is painful to touch is progressive gotten worse.  She is known to Dr. Sharyon Cable who has been doing routine foot care to try to take the pressure off of it but she is here for surgical options as well.  She denies any other acute issues she has not seen anyone else prior to seeing me. ? ? ?Review of Systems: Negative except as noted in the HPI. Denies N/V/F/Ch. ? ?Past Medical History:  ?Diagnosis Date  ? Acute right flank pain 06/29/2016  ? Arthritis   ? "related to the lupus; joints" (08/16/2013)  ? Chronic renal insufficiency, stage III (moderate) ([place]) 06/29/2016  ? 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 Family history of anesthesia complication   ? "daughter doesn't wake up easy from it" (08/16/2013)  ? Fibrosis of liver 04/18/2018  ? Hemochromatosis 05/18/2016  ? 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 Hypertension   ? Iron deficiency anemia   ? Lumbar disc disease   ? Lupus ([place])   ? "just the rash and arthritis" (08/16/2013)  ? Macrocytic anemia 06/29/2016  ? 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/18/2016  ? Scoliosis   ? Syncope and collapse   ? "lost consciousness ~ 4-5 seconds; didn't hurt myself" (08/16/2013)  ? ? ?Current Outpatient Medications:  ?  acetaminophen-codeine (TYLENOL #3) 300-30 MG tablet, Take 1 tablet by mouth every 8 (eight) hours as needed for moderate pain., Disp: 15 tablet, Rfl: 0 ?  baclofen (LIORESAL) 10 MG tablet, Take 1/2 to 1 by mouth every 8hrs as needed for spasm, Disp: 60 tablet, Rfl: 0 ?  calcium carbonate (OS-CAL) 600 MG TABS, Take 600 mg by mouth 2 (two) times daily with a meal., Disp: , Rfl:  ?  CREON 24000-76000 units CPEP, , Disp: , Rfl:  ?  dexamethasone 0.5 MG/5ML elixir, SWISH 5 MLS IN THE MOUTH FOR 3  MINUTES THEN SPIT THREE TIMES DAILY FOR 3 WEEKS, Disp: , Rfl:  ?  diazepam (VALIUM) 5 MG tablet, Take 1 by mouth 1 hour  pre-procedure with very light food. May bring 2nd tablet to appointment., Disp: 2 tablet, Rfl: 0 ?  diazepam (VALIUM) 5 MG tablet, Take one tablet by mouth with food one hour prior to procedure. May repeat 30 minutes prior if needed., Disp: 2 tablet, Rfl: 0 ?  doxazosin (CARDURA) 8 MG tablet, Take 8 mg by mouth at bedtime., Disp: , Rfl:  ?  doxycycline (VIBRAMYCIN) 100 MG capsule, Take 1 capsule (100 mg total) by mouth 2 (two) times daily., Disp: 20 capsule, Rfl: 0 ?  fexofenadine (ALLEGRA) 180 MG tablet, Take 180 mg by mouth daily., Disp: , Rfl:  ?  gabapentin (NEURONTIN) 300 MG capsule, TAKE 1 CAPSULE(300 MG) BY MOUTH AT BEDTIME, Disp: 90 capsule, Rfl: 3 ?  hydrOXYzine (ATARAX/VISTARIL) 10 MG tablet, Take by mouth., Disp: , Rfl:  ?  levothyroxine (SYNTHROID, LEVOTHROID) 50 MCG tablet, Take 50 mcg by mouth daily before breakfast., Disp: , Rfl:  ?  lidocaine (XYLOCAINE) 2 % solution, SMARTSIG:3-5 Milliliter(s) By Mouth Every 4-6 Hours PRN, Disp: , Rfl:  ?  lidocaine-prilocaine (EMLA) cream, Apply 1 application topically as needed., Disp: 30 g, Rfl: 0 ?  LORazepam (ATIVAN) 0.5 MG tablet, Take 1 tablet prior  to phlebotomy, Disp: 2 tablet, Rfl: 0 ?  losartan (COZAAR) 100 MG tablet, Take 100 mg by mouth daily., Disp: , Rfl:  ?  milk thistle 175 MG tablet, Take 250 mg by mouth daily., Disp: , Rfl:  ?  Multiple Vitamin (MULTIVITAMIN) tablet, Take 1 tablet by mouth daily., Disp: , Rfl:  ?  Potassium Gluconate 595 MG CAPS, Take by mouth., Disp: , Rfl:  ?  rosuvastatin (CRESTOR) 10 MG tablet, Take 10 mg by mouth at bedtime., Disp: , Rfl:  ?  vitamin B-12 (CYANOCOBALAMIN) 1000 MCG tablet, Take 1,000 mcg by mouth daily., Disp: , Rfl:  ? ?Current Facility-Administered Medications:  ?  sodium chloride 0.9 % injection 250 mL, 250 mL, Intravenous, Once, Bartholomew Crews, MD ? ?Social History  ? ?Tobacco Use   ?Smoking Status Every Day  ? Packs/day: 1.00  ? Years: 48.00  ? Pack years: 48.00  ? Types: Cigarettes  ?Smokeless Tobacco Never  ? ? ?Allergies  ?Allergen Reactions  ? Prednisone Anaphylaxis  ?  Does ok with intra-muscular and epidural steroid  ? Sulfonamide Derivatives   ?  REACTION: throat swells  ? Epinephrine Palpitations  ?  In lidocaine gave her "flutters" per dentist  ? Midol [Ibuprofen]   ? ?Objective:  ?There were no vitals filed for this visit. ?There is no height or weight on file to calculate BMI. ?Constitutional Well developed. ?Well nourished.  ?Vascular Dorsalis pedis pulses palpable bilaterally. ?Posterior tibial pulses palpable bilaterally. ?Capillary refill normal to all digits.  ?No cyanosis or clubbing noted. ?Pedal hair growth normal.  ?Neurologic Normal speech. ?Oriented to person, place, and time. ?Epicritic sensation to light touch grossly present bilaterally.  ?Dermatologic Hyperkeratotic lesion with central nucleated core noted to right submetatarsal 1.  Pain on palpation.  No pinpoint bleeding noted upon debridement there is also underlying plantarflexed first metatarsal noted  ?Orthopedic: Normal joint ROM without pain or crepitus bilaterally. ?No visible deformities. ?No bony tenderness.  ? ?Radiographs: None ?Assessment:  ? ?1. Plantar flexed metatarsal, right   ? ?Plan:  ?Patient was evaluated and treated and all questions answered. ? ?Right submet 1 porokeratosis/plantarflexed met ?-All questions and concerns were discussed with the patient in extensive detail ?-I discussed with her that this is likely due to plantarflexed met with sesamoid putting pressure against the skin.  She also has underlying plantar fat pad atrophy leading to a great centimeter abrasion.  Ultimately she will benefit from surgical removal of the sesamoid to help with the pain.  I discussed this briefly.  She would like to discuss and think about surgery and will get back to me when she is ready ? ?No  follow-ups on file. ?

## 2022-04-12 ENCOUNTER — Other Ambulatory Visit: Payer: Self-pay | Admitting: Internal Medicine

## 2022-04-12 DIAGNOSIS — F17208 Nicotine dependence, unspecified, with other nicotine-induced disorders: Secondary | ICD-10-CM

## 2022-04-13 ENCOUNTER — Other Ambulatory Visit: Payer: Self-pay | Admitting: Internal Medicine

## 2022-04-13 DIAGNOSIS — K862 Cyst of pancreas: Secondary | ICD-10-CM

## 2022-04-21 ENCOUNTER — Ambulatory Visit (INDEPENDENT_AMBULATORY_CARE_PROVIDER_SITE_OTHER): Payer: Medicare Other | Admitting: Podiatry

## 2022-04-21 ENCOUNTER — Encounter: Payer: Self-pay | Admitting: Podiatry

## 2022-04-21 DIAGNOSIS — N182 Chronic kidney disease, stage 2 (mild): Secondary | ICD-10-CM

## 2022-04-21 DIAGNOSIS — M79674 Pain in right toe(s): Secondary | ICD-10-CM

## 2022-04-21 DIAGNOSIS — M79675 Pain in left toe(s): Secondary | ICD-10-CM

## 2022-04-21 DIAGNOSIS — Q828 Other specified congenital malformations of skin: Secondary | ICD-10-CM

## 2022-04-21 DIAGNOSIS — M216X1 Other acquired deformities of right foot: Secondary | ICD-10-CM

## 2022-04-21 DIAGNOSIS — B351 Tinea unguium: Secondary | ICD-10-CM | POA: Diagnosis not present

## 2022-04-21 NOTE — Progress Notes (Signed)
This patient returns to my office for at risk foot care.  This patient requires this care by a professional since this patient will be at risk due to having chronic kidney disease stage  3.  Patient says the callus are painful walking and wearing her shoes.  This patient is uable to trim her callus since she cannot reach her feet  due to back problems.  She saw Dr.  Posey Pronto for surgical consult.This patient presents for at risk foot care today.  General Appearance  Alert, conversant and in no acute stress.  Vascular  Dorsalis pedis and posterior tibial  pulses are palpable  bilaterally.  Capillary return is within normal limits  bilaterally. Temperature is within normal limits  bilaterally.  Neurologic  Senn-Weinstein monofilament wire test within normal limits  bilaterally. Muscle power within normal limits bilaterally.  Nails Thick disfigured discolored nails with subungual debris hallux nails bilaterally. No evidence of bacterial infection or drainage bilaterally.  Orthopedic  No limitations of motion  feet .  No crepitus or effusions noted.  No bony pathology or digital deformities noted.  Enlarged and painful tibial sesamoid right foot.  Skin  normotropic skin   noted bilaterally.  No signs of infections or ulcers noted.   Porokeratosis sub 1 right foot.  Callus left hallux.  Porokeratosis  B/L.   Onychomycosis  B/L.  Consent was obtained for treatment procedures.   Debridement of calluses both feet with a # 15 blade.  Patient has acquired gel pads which help with her pain. Debridement of nails with nail nipper and dremel tool.     Return office visit    9   weeks                   Told patient to return for periodic foot care and evaluation due to potential at risk complications.   Gardiner Barefoot DPM

## 2022-05-01 ENCOUNTER — Other Ambulatory Visit: Payer: Medicare Other

## 2022-05-04 NOTE — Patient Outreach (Signed)
[facility]) Care Management  05/04/2022  Catherine Harrington [birth year] [id number]   Received referral for Care Management from Insurance plan. Assigned patient to Raina Mina, RN Care Coordinator for follow up.   [place] Management Assistant [phone]

## 2022-05-05 ENCOUNTER — Ambulatory Visit
Admission: RE | Admit: 2022-05-05 | Discharge: 2022-05-05 | Disposition: A | Discharge status: Home / Self Care | Payer: Medicare Other | Source: Ambulatory Visit | Attending: Internal Medicine | Admitting: Internal Medicine

## 2022-05-05 DIAGNOSIS — F17208 Nicotine dependence, unspecified, with other nicotine-induced disorders: Secondary | ICD-10-CM

## 2022-05-12 ENCOUNTER — Other Ambulatory Visit: Payer: Self-pay | Admitting: *Deleted

## 2022-05-12 NOTE — Patient Outreach (Signed)
[facility]) Care Management  05/12/2022  Catherine Harrington 03/21/1946 [id number]   Case closure  Pt contacted the [facility] office and stressed that she did not want [facility] services and requested to be taken off the call schedule for future outreach calls.   RN Will close this case as requested and alert pt's provider of pt's disposition of [facility] services.  No additional outreach call will be made at this time.  Elliot Cousin, RN Care Management Coordinator [facility] Main Office [phone]

## 2022-05-12 NOTE — Patient Outreach (Signed)
[facility]) Care Management  05/12/2022  Catherine Harrington May 16, 1946 [id number]   Telephone Screen-Unsuccessful #1  RN attempted outreach call today however unsuccessful. RN able to leave a HIPAA approved voice message requesting a call back to the DPR spouse # [phone].  Will attempt another outreach call over the next week.  Elliot Cousin, RN Care Management Coordinator [facility] Main Office [phone]

## 2022-05-18 ENCOUNTER — Ambulatory Visit
Admission: RE | Admit: 2022-05-18 | Discharge: 2022-05-18 | Disposition: A | Discharge status: Home / Self Care | Payer: Medicare Other | Source: Ambulatory Visit | Attending: Internal Medicine | Admitting: Internal Medicine

## 2022-05-18 DIAGNOSIS — K862 Cyst of pancreas: Secondary | ICD-10-CM

## 2022-05-18 MED ORDER — GADOBENATE DIMEGLUMINE 529 MG/ML IV SOLN
8.0000 mL | Freq: Once | INTRAVENOUS | Status: AC | PRN
  Administered 2022-05-18: 8 mL via INTRAVENOUS

## 2022-05-19 ENCOUNTER — Ambulatory Visit: Payer: Medicare Other | Admitting: *Deleted

## 2022-06-02 ENCOUNTER — Other Ambulatory Visit: Payer: Self-pay

## 2022-06-03 ENCOUNTER — Inpatient Hospital Stay: Payer: Medicare Other | Attending: Hematology | Admitting: Hematology

## 2022-06-03 ENCOUNTER — Encounter: Payer: Self-pay | Admitting: Hematology

## 2022-06-03 ENCOUNTER — Inpatient Hospital Stay: Payer: Medicare Other

## 2022-06-03 ENCOUNTER — Other Ambulatory Visit: Payer: Self-pay

## 2022-06-03 DIAGNOSIS — Z79899 Other long term (current) drug therapy: Secondary | ICD-10-CM | POA: Insufficient documentation

## 2022-06-03 DIAGNOSIS — F17210 Nicotine dependence, cigarettes, uncomplicated: Secondary | ICD-10-CM | POA: Diagnosis not present

## 2022-06-03 DIAGNOSIS — D7589 Other specified diseases of blood and blood-forming organs: Secondary | ICD-10-CM | POA: Insufficient documentation

## 2022-06-03 DIAGNOSIS — D539 Nutritional anemia, unspecified: Secondary | ICD-10-CM | POA: Diagnosis not present

## 2022-06-03 DIAGNOSIS — E039 Hypothyroidism, unspecified: Secondary | ICD-10-CM | POA: Diagnosis not present

## 2022-06-03 DIAGNOSIS — N183 Chronic kidney disease, stage 3 unspecified: Secondary | ICD-10-CM | POA: Diagnosis not present

## 2022-06-03 DIAGNOSIS — L930 Discoid lupus erythematosus: Secondary | ICD-10-CM | POA: Insufficient documentation

## 2022-06-03 DIAGNOSIS — I129 Hypertensive chronic kidney disease with stage 1 through stage 4 chronic kidney disease, or unspecified chronic kidney disease: Secondary | ICD-10-CM | POA: Insufficient documentation

## 2022-06-03 DIAGNOSIS — M1990 Unspecified osteoarthritis, unspecified site: Secondary | ICD-10-CM | POA: Diagnosis not present

## 2022-06-03 LAB — CMP (CANCER CENTER ONLY)
ALT: 41 U/L (ref 0–44)
AST: 63 U/L — ABNORMAL HIGH (ref 15–41)
Albumin: 4.4 g/dL (ref 3.5–5.0)
Alkaline Phosphatase: 60 U/L (ref 38–126)
Anion gap: 6 (ref 5–15)
BUN: 16 mg/dL (ref 8–23)
CO2: 23 mmol/L (ref 22–32)
Calcium: 9.3 mg/dL (ref 8.9–10.3)
Chloride: 107 mmol/L (ref 98–111)
Creatinine: 1.2 mg/dL — ABNORMAL HIGH (ref 0.44–1.00)
GFR, Estimated: 47 mL/min — ABNORMAL LOW (ref >60)
Glucose, Bld: 81 mg/dL (ref 70–99)
Potassium: 4.2 mmol/L (ref 3.5–5.1)
Sodium: 136 mmol/L (ref 135–145)
Total Bilirubin: 0.5 mg/dL (ref 0.3–1.2)
Total Protein: 7.7 g/dL (ref 6.5–8.1)

## 2022-06-03 LAB — CBC WITH DIFFERENTIAL (CANCER CENTER ONLY)
Abs Immature Granulocytes: 0.01 10*3/uL (ref 0.00–0.07)
Basophils Absolute: 0 10*3/uL (ref 0.0–0.1)
Basophils Relative: 0 %
Eosinophils Absolute: 0 10*3/uL (ref 0.0–0.5)
Eosinophils Relative: 1 %
HCT: 31.1 % — ABNORMAL LOW (ref 36.0–46.0)
Hemoglobin: 10.8 g/dL — ABNORMAL LOW (ref 12.0–15.0)
Immature Granulocytes: 0 %
Lymphocytes Relative: 24 %
Lymphs Abs: 1 10*3/uL (ref 0.7–4.0)
MCH: 35.6 pg — ABNORMAL HIGH (ref 26.0–34.0)
MCHC: 34.7 g/dL (ref 30.0–36.0)
MCV: 102.6 fL — ABNORMAL HIGH (ref 80.0–100.0)
Monocytes Absolute: 0.4 10*3/uL (ref 0.1–1.0)
Monocytes Relative: 9 %
Neutro Abs: 2.7 10*3/uL (ref 1.7–7.7)
Neutrophils Relative %: 66 %
Platelet Count: 150 10*3/uL (ref 150–400)
RBC: 3.03 MIL/uL — ABNORMAL LOW (ref 3.87–5.11)
RDW: 12.8 % (ref 11.5–15.5)
WBC Count: 4.1 10*3/uL (ref 4.0–10.5)
nRBC: 0 % (ref 0.0–0.2)

## 2022-06-03 LAB — IRON AND IRON BINDING CAPACITY (CC-WL,HP ONLY)
Iron: 144 ug/dL (ref 28–170)
Saturation Ratios: 53 % — ABNORMAL HIGH (ref 10.4–31.8)
TIBC: 274 ug/dL (ref 250–450)
UIBC: 130 ug/dL — ABNORMAL LOW (ref 148–442)

## 2022-06-03 LAB — FERRITIN: Ferritin: 173 ng/mL (ref 11–307)

## 2022-06-03 NOTE — Progress Notes (Signed)
[facility]   Telephone:(336) [phone] Fax:(336) [phone]   Clinic Follow up Note   Patient Care Team: Cleatis Polka., MD as PCP - General (Internal Medicine) Cleatis Polka., MD (Internal Medicine)  Date of Service:  06/03/2022  CHIEF COMPLAINT: f/u of hereditary hemochromatosis  CURRENT THERAPY:  Therapeutic Phlebotomies with 0.5 unit blood as needed to keep ferritin below 300  ASSESSMENT & PLAN:  Catherine Harrington is a 76 y.o. female with   1. 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  She never had liver biopsy.  -US Abdomen 01/02/21 showed heterogeneous, subtly echogenic appearance of liver without focal liver lesion. -most recent MRI abdomen on 05/18/22 was negative. -she has been undergoing phlebotomy as needed to keep her ferritin under 300, last 01/2020. She does not like them, mainly due to pain (even with emla cream), and has declined unless absolutely necessary. -We discussed the option of oral iron chelating agents, potential side effects.  I encouraged her to continue phlebotomy as she can tolerate, but will consider iron chelating if she is not able to do more phlebotomy -she agrees to continue labs and phlebotomy as needed every 6 months.   2. Chronic macrocytic anemia -Macrocytosis may be secondary to previous chronic alcohol use and hypothyroidism. Normal B12 and folic acid.  Myelodysplastic syndrome not excluded but felt to be unlikely given stable blood counts  -Labs reviewed, overall stable, Hgb 10.8, HCT 31.1%, MCV 102.6, MCH 35.6, and iron serum 144, very stable from one year ago.   3. Comorbidities: Discoid Lupus, Arthritis with chronic back pain, Hypothyroid, HTN, Smoking Cessation -She continues to f/u with her  Rheumatologist. -On baclofen and Gabapentin for pain, stable -On Levothyroxine and losartan -She smokes about 1 ppd. Cessation has been previously discussed and advised.  -She follows Dr. Alvester Morin for her arthritis and receives injections as needed. -Most recent lung cancer screening 05/05/22 was benign   4. Possible abdominal Hernia  -Surgery was not recommended by general surgeon.      PLAN:  -Will call her with ferritin level -f/u with Dr. Elsie Stain as indicated -labs in 6 months and 1 year, phlebotomy with half unit blood if ferritin above 300 -f/u in 1 year   No problem-specific Assessment & Plan notes found for this encounter.   INTERVAL HISTORY:  Catherine Harrington is here for a follow up of hemochromatosis. She was last seen by me a year ago. She presents to the clinic alone. She reports she is stable overall, no new concerns.   All other systems were reviewed with the patient and are negative.  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   Hypertension    Iron deficiency anemia    Lumbar disc disease    Lupus ([facility])    "just the rash and arthritis" (08/16/2013)   Macrocytic anemia 06/29/2016   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/18/2016   Scoliosis  Syncope and collapse    "lost consciousness ~ 4-5 seconds; didn't hurt myself" (08/16/2013)    SURGICAL HISTORY: Past Surgical History:  Procedure Laterality Date   CATARACT EXTRACTION Bilateral 10/19 11/19   CESAREAN SECTION  1967; [place]  2013   " infection under bridge cleaned out, etc" (08/16/2013)   TUBAL  LIGATION      I have reviewed the social history and family history with the patient and they are unchanged from previous note.  ALLERGIES:  is allergic to prednisone, sulfonamide derivatives, epinephrine, and midol [ibuprofen].  MEDICATIONS:  Current Outpatient Medications  Medication Sig Dispense Refill   acetaminophen-codeine (TYLENOL #3) 300-30 MG tablet Take 1 tablet by mouth every 8 (eight) hours as needed for moderate pain. 15 tablet 0   baclofen (LIORESAL) 10 MG tablet Take 1/2 to 1 by mouth every 8hrs as needed for spasm 60 tablet 0   calcium carbonate (OS-CAL) 600 MG TABS Take 600 mg by mouth 2 (two) times daily with a meal.     CREON 24000-76000 units CPEP      dexamethasone 0.5 MG/5ML elixir SWISH 5 MLS IN THE MOUTH FOR 3 MINUTES THEN SPIT THREE TIMES DAILY FOR 3 WEEKS     diazepam (VALIUM) 5 MG tablet Take 1 by mouth 1 hour  pre-procedure with very light food. May bring 2nd tablet to appointment. 2 tablet 0   diazepam (VALIUM) 5 MG tablet Take one tablet by mouth with food one hour prior to procedure. May repeat 30 minutes prior if needed. 2 tablet 0   doxazosin (CARDURA) 8 MG tablet Take 8 mg by mouth at bedtime.     doxycycline (VIBRAMYCIN) 100 MG capsule Take 1 capsule (100 mg total) by mouth 2 (two) times daily. 20 capsule 0   fexofenadine (ALLEGRA) 180 MG tablet Take 180 mg by mouth daily.     gabapentin (NEURONTIN) 300 MG capsule TAKE 1 CAPSULE(300 MG) BY MOUTH AT BEDTIME 90 capsule 3   hydrOXYzine (ATARAX/VISTARIL) 10 MG tablet Take by mouth.     levothyroxine (SYNTHROID, LEVOTHROID) 50 MCG tablet Take 50 mcg by mouth daily before breakfast.     lidocaine (XYLOCAINE) 2 % solution SMARTSIG:3-5 Milliliter(s) By Mouth Every 4-6 Hours PRN     lidocaine-prilocaine (EMLA) cream Apply 1 application topically as needed. 30 g 0   LORazepam (ATIVAN) 0.5 MG tablet Take 1 tablet prior to phlebotomy 2 tablet 0   losartan (COZAAR) 100 MG tablet Take 100 mg by mouth daily.     milk  thistle 175 MG tablet Take 250 mg by mouth daily.     Multiple Vitamin (MULTIVITAMIN) tablet Take 1 tablet by mouth daily.     Potassium Gluconate 595 MG CAPS Take by mouth.     rosuvastatin (CRESTOR) 10 MG tablet Take 10 mg by mouth at bedtime.     vitamin B-12 (CYANOCOBALAMIN) 1000 MCG tablet Take 1,000 mcg by mouth daily.     Current Facility-Administered Medications  Medication Dose Route Frequency Provider Last Rate Last Admin   sodium chloride 0.9 % injection 250 mL  250 mL Intravenous Once Bartholomew Crews, MD        PHYSICAL EXAMINATION: ECOG PERFORMANCE STATUS: 2 - Symptomatic, <50% confined to bed  Vitals:   06/03/22 1447  BP: 122/60  Pulse: 85  Resp: 18  Temp: 98.3 F (36.8 C)  SpO2: 100%   Wt Readings from Last 3 Encounters:  06/03/22 94 lb (42.6 kg)  05/29/21 91  lb 8 oz (41.5 kg)  05/29/20 96 lb 8 oz (43.8 kg)     GENERAL:alert, no distress and comfortable SKIN: skin color normal, no rashes or significant lesions EYES: normal, Conjunctiva are pink and non-injected, sclera clear  NEURO: alert & oriented x 3 with fluent speech  LABORATORY DATA:  I have reviewed the data as listed    Latest Ref Rng & Units 06/03/2022    2:24 PM 05/29/2021    1:53 PM 12/01/2020   10:35 AM  CBC  WBC 4.0 - 10.5 K/uL 4.1  4.1  4.5   Hemoglobin 12.0 - 15.0 g/dL 72.0  94.7  09.6   Hematocrit 36.0 - 46.0 % 31.1  31.1  32.2   Platelets 150 - 400 K/uL 150  170  182         Latest Ref Rng & Units 06/03/2022    2:24 PM 05/29/2021    1:53 PM 05/24/2019   12:41 PM  CMP  Glucose 70 - 99 mg/dL 81  93  88   BUN 8 - 23 mg/dL 16  14  12    Creatinine 0.44 - 1.00 mg/dL  2.83  6.62   Sodium 135 - 145 mmol/L 136  132  134   Potassium 3.5 - 5.1 mmol/L 4.2  4.4  4.5   Chloride 98 - 111 mmol/L 107  101  103   CO2 22 - 32 mmol/L 23  19  20    Calcium 8.9 - 10.3 mg/dL 9.3  9.1  9.3   Total Protein 6.5 - 8.1 g/dL 7.7  7.7  7.7   Total Bilirubin 0.3 - 1.2 mg/dL 0.5  0.6  0.3   Alkaline Phos  38 - 126 U/L 60  95  64   AST 15 - 41 U/L 63  154  53   ALT 0 - 44 U/L 41  88  24       RADIOGRAPHIC STUDIES: I have personally reviewed the radiological images as listed and agreed with the findings in the report. No results found.    No orders of the defined types were placed in this encounter.  All questions were answered. The patient knows to call the clinic with any problems, questions or concerns. No barriers to learning was detected. The total time spent in the appointment was 25 minutes.     9.47, MD 06/03/2022   I, Malachy Mood, am acting as scribe for 08/04/2022, MD.   I have reviewed the above documentation for accuracy and completeness, and I agree with the above.

## 2022-06-11 ENCOUNTER — Telehealth: Payer: Self-pay | Admitting: Physical Medicine and Rehabilitation

## 2022-06-11 NOTE — Telephone Encounter (Signed)
Pt called to set an appt. Please call pt at [phone].

## 2022-06-15 ENCOUNTER — Ambulatory Visit (INDEPENDENT_AMBULATORY_CARE_PROVIDER_SITE_OTHER): Payer: Medicare Other | Admitting: Physical Medicine and Rehabilitation

## 2022-06-15 ENCOUNTER — Encounter: Payer: Self-pay | Admitting: Physical Medicine and Rehabilitation

## 2022-06-15 DIAGNOSIS — M48061 Spinal stenosis, lumbar region without neurogenic claudication: Secondary | ICD-10-CM | POA: Diagnosis not present

## 2022-06-15 DIAGNOSIS — M5416 Radiculopathy, lumbar region: Secondary | ICD-10-CM

## 2022-06-15 DIAGNOSIS — M4156 Other secondary scoliosis, lumbar region: Secondary | ICD-10-CM

## 2022-06-15 DIAGNOSIS — M4726 Other spondylosis with radiculopathy, lumbar region: Secondary | ICD-10-CM

## 2022-06-15 MED ORDER — DIAZEPAM 5 MG PO TABS: ORAL_TABLET | ORAL | 0 refills | Status: DC

## 2022-06-15 NOTE — Progress Notes (Unsigned)
Catherine Harrington - 76 y.o. female MRN [id number]  Date of birth: [birth year]  Office Visit Note: Visit Date: 06/15/2022 PCP: Cleatis Polka., MD Referred by: Cleatis Polka., MD  Subjective: Chief Complaint  Patient presents with   Lower Back - Pain   Left Leg - Pain   HPI: Catherine Harrington is a 75 y.o. female who comes in today for evaluation of chronic, worsening and severe right sided lower back pain radiating to buttock and around to posterolateral leg. Patient reports pain has been ongoing for several years and is exacerbated by movement and activity. Patient states her pain increased over the past several weeks. She describes pain as sore, aching and shooting pain, currently rates as 8 out of 10. Patient reports some relief of pain with rest, use of heating pad and medications. Patient does attend physical therapy with Aart Schulenklopper at [facility] intermittently as needed for dry needing and manual treatments. She reports significant relief of pain with continued physical therapy. Patients lumbar MRI imaging from 2021 exhibits advanced lumbar spine degeneration and dextroscoliosis, most severe at L3-L4. There is right foraminal and subarticular recess impingement at L4-L5. No high grade spinal canal stenosis noted. Patient has history of lumbar radiofrequency ablation procedure performed in our office in 2020, states good relief of lower back pain with this procedure. Most recent lumbar epidural steroid injection was right L5 transforaminal on 06/08/2021, states she is unsure if this injection helped significantly. Patient does reports her symptoms are similar to last year and would like to repeat injection if able. Patient states her severe pain does negatively impact her daily living, makes it difficult to complete daily tasks. Patient is currently using cane when she leaves home to assist with ambulation. Patient denies focal weakness, numbness and tingling. Patient denies recent  trauma or falls.  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     Review of Systems  Constitutional:  Positive for malaise/fatigue.  Musculoskeletal:  Positive for back pain.  Neurological:  Negative for tingling, sensory change, focal weakness and weakness.  All other systems reviewed and are negative.  Otherwise per HPI.  Assessment & Plan: Visit Diagnoses:    ICD-10-CM   1. Lumbar radiculopathy  M54.16 Ambulatory referral to Physical Medicine Rehab    2. Other spondylosis with radiculopathy, lumbar region  M47.26 Ambulatory referral to Physical Medicine Rehab    3. Other secondary scoliosis, lumbar region  M41.56 Ambulatory referral to Physical Medicine Rehab    4. Foraminal stenosis of lumbar region  M48.061 Ambulatory referral to Physical Medicine Rehab       Plan: Findings:  Chronic, worsening and severe right sided lower back pain radiating to buttock and around to posterolateral leg. Patient continues to have severe pain despite good conservative therapies such as formal physical therapy, use of heating pad and medications. Patients clinical presentation and exam are consistent with L5 nerve pattern. We believe the next step is to repeat right L5 transforaminal epidural steroid injection under fluoroscopic guidance. Patient did voice concerns about anxiety related to procedure, I did place prescription for pre-procedure Valium today. If patient continues to get good relief with lumbar injections we can repeat this procedure infrequently as needed. I did encourage patient to continue to follow up with [facility] as needed. Patient instructed to use can to assist with ambulation and prevent falls. We feel that we can get patient in quickly  for injection. No red flag symptoms noted upon exam today.     Meds & Orders:  Meds ordered this encounter  Medications   diazepam (VALIUM) 5 MG  tablet    Sig: Take one tablet by mouth with food one hour prior to procedure. May repeat 30 minutes prior if needed.    Dispense:  2 tablet    Refill:  0    Orders Placed This Encounter  Procedures   Ambulatory referral to Physical Medicine Rehab    Follow-up: No follow-ups on file.   Procedures: No procedures performed      Clinical History: EXAM: MRI LUMBAR SPINE WITHOUT CONTRAST   TECHNIQUE: Multiplanar, multisequence MR imaging of the lumbar spine was performed. No intravenous contrast was administered.   COMPARISON:  05/09/2017   FINDINGS: Segmentation: 5 lumbar type vertebrae based on the available coverage   Alignment: Marked dextroscoliosis with rightward translation at L4-5. The curvature is centered at the level of L3.   Vertebrae: Degenerative fatty marrow conversion at L2-L5 primarily. No fracture, discitis, or aggressive bone lesion   Conus medullaris and cauda equina: Conus extends to the T12 level. Conus and cauda equina appear normal.   Paraspinal and other soft tissues: Marked muscular atrophy involving the intrinsic back muscles and bilateral psoas. No incidental inflammation or mass is seen about the spine.   Disc levels:   T12- L1: Unremarkable.   L1-L2: Disc collapse and endplate degeneration with ridging. No compressive spinal or foraminal stenosis   L2-L3: Asymmetric leftward disc collapse. Degenerative facet spurring asymmetric to the left. The canal and foramina are patent. The left subarticular recess is effaced.   L3-L4: Disc collapse asymmetric to the left. Intervertebral ankylosis has occurred remotely. Facet spurring asymmetric to the left where there is moderate foraminal narrowing. Left subarticular recess narrowing that could affect the L4 nerve root.   L4-L5: Disc collapse and endplate degeneration. Degenerative facet spurring asymmetric to the right. Advanced right foraminal stenosis. Right subarticular recess narrowing  that could certainly affect the L5 nerve root. More moderate left subarticular recess narrowing.   L5-S1:Degenerative facet spurring with small joint effusions. Preserved disc height and hydration compared to the other levels. No neural compression.   IMPRESSION: 1. No acute or interval finding when compared to 2018. 2. Advanced lumbar spine degeneration and dextroscoliosis. 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 3. L4-5 right foraminal and subarticular recess impingement. 4. On the left there is subarticular recess stenosis at L2-3 to L4-5. 5. Advanced and generalized muscular atrophy.     Electronically Signed   By: Monte Fantasia M.D.   On: 09/12/2020 18:55   She reports that she has been smoking cigarettes. She has a 48.00 pack-year smoking history. She has never used smokeless tobacco. No results for input(s): "HGBA1C", "LABURIC" in the last 8760 hours.  Objective:  VS:  HT:    WT:   BMI:     BP:   HR: bpm  TEMP: ( )  RESP:  Physical Exam Vitals and nursing note reviewed.  HENT:     Head: Normocephalic and atraumatic.     Right Ear: External ear normal.     Left Ear: External ear normal.     Nose: Nose normal.     Mouth/Throat:     Mouth: Mucous membranes are moist.  Eyes:     Extraocular Movements: Extraocular movements intact.  Cardiovascular:     Rate and Rhythm: Normal rate.     Pulses: Normal pulses.  Pulmonary:  Effort: Pulmonary effort is normal.  Abdominal:     General: Abdomen is flat. There is no distension.  Musculoskeletal:        General: Tenderness present.     Cervical back: Normal range of motion.     Comments: Pt is slow to rise from seated position to standing. She stands with obvious leftward lumbar scoliosis. Good lumbar range of motion. Strong distal strength without clonus, no pain upon palpation of greater trochanters. Dysesthesias noted to right L5 dermatome. Sensation intact bilaterally. Ambulates with cane, gait slow and  unsteady.   Skin:    General: Skin is warm and dry.     Capillary Refill: Capillary refill takes less than 2 seconds.  Neurological:     Mental Status: She is alert.     Gait: Gait abnormal.  Psychiatric:        Mood and Affect: Mood normal.        Behavior: Behavior normal.     Ortho Exam  Imaging: No results found.  Past Medical/Family/Surgical/Social History: Medications & Allergies reviewed per EMR, new medications updated. 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   Macrocytosis 08/16/2013   Syncope 08/16/2013   DIVERTICULOSIS, COLON 11/04/2010   Past Medical History:  Diagnosis Date   Acute right flank pain 06/29/2016   Arthritis    "related to the lupus; joints" (08/16/2013)   Chronic renal insufficiency, stage III (moderate) (HCC) 06/29/2016   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   Family history of anesthesia complication    "daughter doesn't wake up easy from it" (08/16/2013)   Fibrosis of liver 04/18/2018   Hemochromatosis 05/18/2016   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   Hypertension    Iron deficiency anemia    Lumbar disc disease    Lupus (HCC)    "just the rash and arthritis" (08/16/2013)   Macrocytic anemia 06/29/2016   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/18/2016   Scoliosis    Syncope and collapse    "lost consciousness ~ 4-5 seconds; didn't hurt myself" (08/16/2013)   Family  History  Problem Relation Age of Onset   Hypertension Mother    Hypertension Father    Past Surgical History:  Procedure Laterality Date   CATARACT EXTRACTION Bilateral 10/19 11/19   CESAREAN SECTION  1967; 1971   MOUTH SURGERY  2013   " infection under bridge cleaned out, etc" (08/16/2013)   TUBAL LIGATION     Social History   Occupational History   Occupation: retired  Tobacco Use   Smoking status: Every Day    Packs/day: 1.00    Years: 48.00    Total pack years: 48.00    Types: Cigarettes   Smokeless tobacco: Never  Substance and Sexual Activity   Alcohol use: Yes    Alcohol/week: 21.0 standard drinks of alcohol    Types: 21 Cans of beer per week    Comment:  3-4 drinks daily sometimes.   Drug use: No   Sexual activity:  Not Currently    Partners: Male    Birth control/protection: Post-menopausal

## 2022-06-21 ENCOUNTER — Ambulatory Visit (INDEPENDENT_AMBULATORY_CARE_PROVIDER_SITE_OTHER): Payer: Medicare Other | Admitting: Physical Medicine and Rehabilitation

## 2022-06-21 ENCOUNTER — Ambulatory Visit: Payer: Self-pay

## 2022-06-21 ENCOUNTER — Encounter: Payer: Self-pay | Admitting: Physical Medicine and Rehabilitation

## 2022-06-21 VITALS — BP 102/54 | HR 97

## 2022-06-21 DIAGNOSIS — M419 Scoliosis, unspecified: Secondary | ICD-10-CM

## 2022-06-21 DIAGNOSIS — M5416 Radiculopathy, lumbar region: Secondary | ICD-10-CM

## 2022-06-21 DIAGNOSIS — R269 Unspecified abnormalities of gait and mobility: Secondary | ICD-10-CM

## 2022-06-21 MED ORDER — METHYLPREDNISOLONE ACETATE 80 MG/ML IJ SUSP
80.0000 mg | Freq: Once | INTRAMUSCULAR | Status: AC
  Administered 2022-06-21: 80 mg

## 2022-06-21 NOTE — Progress Notes (Signed)
Pt state lower back pain that travels to her right buttocks and down her right leg. Pt state sitting, walking and standing makes the pain worse. Pt state she takes pain meds and pain patches to help ease her pain.  Numeric Pain Rating Scale and Functional Assessment Average Pain 8   In the last MONTH (on 0-10 scale) has pain interfered with the following?  1. General activity like being  able to carry out your everyday physical activities such as walking, climbing stairs, carrying groceries, or moving a chair?  Rating(10)   +Driver, -BT, -Dye Allergies.

## 2022-06-21 NOTE — Patient Instructions (Signed)

## 2022-06-28 NOTE — Procedures (Signed)
Lumbosacral Transforaminal Epidural Steroid Injection - Sub-Pedicular Approach with Fluoroscopic Guidance  Patient: Catherine Harrington      Date of Birth: 01-09-1946 MRN: [id number] PCP: Cleatis Polka., MD      Visit Date: 06/21/2022   Universal Protocol:    Date/Time: 06/21/2022  Consent Given By: the patient  Position: PRONE  Additional Comments: Vital signs were monitored before and after the procedure. Patient was prepped and draped in the usual sterile fashion. The correct patient, procedure, and site was verified.   Injection Procedure Details:   Procedure diagnoses: Lumbar radiculopathy [M54.16]    Meds Administered:  Meds ordered this encounter  Medications   methylPREDNISolone acetate (DEPO-MEDROL) injection 80 mg    Laterality: Right  Location/Site: L5  Needle:5.0 in., 22 ga.  Short bevel or Quincke spinal needle  Needle Placement: Transforaminal  Findings:    -Comments: Excellent flow of contrast along the nerve, nerve root and into the epidural space.  Procedure Details: After squaring off the end-plates to get a true AP view, the C-arm was positioned so that an oblique view of the foramen as noted above was visualized. The target area is just inferior to the "nose of the scotty dog" or sub pedicular. The soft tissues overlying this structure were infiltrated with 2-3 ml. of 1% Lidocaine without Epinephrine.  The spinal needle was inserted toward the target using a "trajectory" view along the fluoroscope beam.  Under AP and lateral visualization, the needle was advanced so it did not puncture dura and was located close the 6 O'Clock position of the pedical in AP tracterory. Biplanar projections were used to confirm position. Aspiration was confirmed to be negative for CSF and/or blood. A 1-2 ml. volume of Isovue-250 was injected and flow of contrast was noted at each level. Radiographs were obtained for documentation purposes.   After attaining the desired  flow of contrast documented above, a 0.5 to 1.0 ml test dose of 0.25% Marcaine was injected into each respective transforaminal space.  The patient was observed for 90 seconds post injection.  After no sensory deficits were reported, and normal lower extremity motor function was noted,   the above injectate was administered so that equal amounts of the injectate were placed at each foramen (level) into the transforaminal epidural space.   Additional Comments:  The patient tolerated the procedure well Dressing: 2 x 2 sterile gauze and Band-Aid    Post-procedure details: Patient was observed during the procedure. Post-procedure instructions were reviewed.  Patient left the clinic in stable condition.

## 2022-06-28 NOTE — Progress Notes (Signed)
Catherine Harrington - 76 y.o. female MRN [id number]  Date of birth: Jun 18, 1946  Office Visit Note: Visit Date: 06/21/2022 PCP: Cleatis Polka., MD Referred by: Cleatis Polka., MD  Subjective: Chief Complaint  Patient presents with   Lower Back - Pain   Right Leg - Pain   HPI:  Catherine Harrington is a 76 y.o. female who comes in today at the request of Ellin Goodie, FNP for planned Right L5-S1 Lumbar Transforaminal epidural steroid injection with fluoroscopic guidance.  The patient has failed conservative care including home exercise, medications, time and activity modification.  This injection will be diagnostic and hopefully therapeutic.  Please see requesting physician notes for further details and justification.   ROS Otherwise per HPI.  Assessment & Plan: Visit Diagnoses:    ICD-10-CM   1. Lumbar radiculopathy  M54.16 XR C-ARM NO REPORT    Epidural Steroid injection    methylPREDNISolone acetate (DEPO-MEDROL) injection 80 mg    2. Scoliosis of thoracolumbar spine, unspecified scoliosis type  M41.9     3. Gait abnormality  R26.9       Plan: No additional findings.   Meds & Orders:  Meds ordered this encounter  Medications   methylPREDNISolone acetate (DEPO-MEDROL) injection 80 mg    Orders Placed This Encounter  Procedures   XR C-ARM NO REPORT   Epidural Steroid injection    Follow-up: Return if symptoms worsen or fail to improve.   Procedures: No procedures performed  Lumbosacral Transforaminal Epidural Steroid Injection - Sub-Pedicular Approach with Fluoroscopic Guidance  Patient: Catherine Harrington      Date of Birth: 10/02/1946 MRN: [id number] PCP: Cleatis Polka., MD      Visit Date: 06/21/2022   Universal Protocol:    Date/Time: 06/21/2022  Consent Given By: the patient  Position: PRONE  Additional Comments: Vital signs were monitored before and after the procedure. Patient was prepped and draped in the usual sterile fashion. The correct  patient, procedure, and site was verified.   Injection Procedure Details:   Procedure diagnoses: Lumbar radiculopathy [M54.16]    Meds Administered:  Meds ordered this encounter  Medications   methylPREDNISolone acetate (DEPO-MEDROL) injection 80 mg    Laterality: Right  Location/Site: L5  Needle:5.0 in., 22 ga.  Short bevel or Quincke spinal needle  Needle Placement: Transforaminal  Findings:    -Comments: Excellent flow of contrast along the nerve, nerve root and into the epidural space.  Procedure Details: After squaring off the end-plates to get a true AP view, the C-arm was positioned so that an oblique view of the foramen as noted above was visualized. The target area is just inferior to the "nose of the scotty dog" or sub pedicular. The soft tissues overlying this structure were infiltrated with 2-3 ml. of 1% Lidocaine without Epinephrine.  The spinal needle was inserted toward the target using a "trajectory" view along the fluoroscope beam.  Under AP and lateral visualization, the needle was advanced so it did not puncture dura and was located close the 6 O'Clock position of the pedical in AP tracterory. Biplanar projections were used to confirm position. Aspiration was confirmed to be negative for CSF and/or blood. A 1-2 ml. volume of Isovue-250 was injected and flow of contrast was noted at each level. Radiographs were obtained for documentation purposes.   After attaining the desired flow of contrast documented above, a 0.5 to 1.0 ml test dose of 0.25% Marcaine was injected into each respective  transforaminal space.  The patient was observed for 90 seconds post injection.  After no sensory deficits were reported, and normal lower extremity motor function was noted,   the above injectate was administered so that equal amounts of the injectate were placed at each foramen (level) into the transforaminal epidural space.   Additional Comments:  The patient tolerated the  procedure well Dressing: 2 x 2 sterile gauze and Band-Aid    Post-procedure details: Patient was observed during the procedure. Post-procedure instructions were reviewed.  Patient left the clinic in stable condition.    Clinical History: EXAM: MRI LUMBAR SPINE WITHOUT CONTRAST   TECHNIQUE: Multiplanar, multisequence MR imaging of the lumbar spine was performed. No intravenous contrast was administered.   COMPARISON:  05/09/2017   FINDINGS: Segmentation: 5 lumbar type vertebrae based on the available coverage   Alignment: Marked dextroscoliosis with rightward translation at L4-5. The curvature is centered at the level of L3.   Vertebrae: Degenerative fatty marrow conversion at L2-L5 primarily. No fracture, discitis, or aggressive bone lesion   Conus medullaris and cauda equina: Conus extends to the T12 level. Conus and cauda equina appear normal.   Paraspinal and other soft tissues: Marked muscular atrophy involving the intrinsic back muscles and bilateral psoas. No incidental inflammation or mass is seen about the spine.   Disc levels:   T12- L1: Unremarkable.   L1-L2: Disc collapse and endplate degeneration with ridging. No compressive spinal or foraminal stenosis   L2-L3: Asymmetric leftward disc collapse. Degenerative facet spurring asymmetric to the left. The canal and foramina are patent. The left subarticular recess is effaced.   L3-L4: Disc collapse asymmetric to the left. Intervertebral ankylosis has occurred remotely. Facet spurring asymmetric to the left where there is moderate foraminal narrowing. Left subarticular recess narrowing that could affect the L4 nerve root.   L4-L5: Disc collapse and endplate degeneration. Degenerative facet spurring asymmetric to the right. Advanced right foraminal stenosis. Right subarticular recess narrowing that could certainly affect the L5 nerve root. More moderate left subarticular recess narrowing.    L5-S1:Degenerative facet spurring with small joint effusions. Preserved disc height and hydration compared to the other levels. No neural compression.   IMPRESSION: 1. No acute or interval finding when compared to 2018. 2. Advanced lumbar spine degeneration and dextroscoliosis. 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 3. L4-5 right foraminal and subarticular recess impingement. 4. On the left there is subarticular recess stenosis at L2-3 to L4-5. 5. Advanced and generalized muscular atrophy.     Electronically Signed   By: Marnee Spring M.D.   On: 09/12/2020 18:55     Objective:  VS:  HT:    WT:   BMI:     BP:(!) 102/54  HR:97bpm  TEMP: ( )  RESP:  Physical Exam Vitals and nursing note reviewed.  Constitutional:      General: She is not in acute distress.    Appearance: Normal appearance. She is not ill-appearing.     Comments: Very thin  HENT:     Head: Normocephalic and atraumatic.     Right Ear: External ear normal.     Left Ear: External ear normal.  Eyes:     Extraocular Movements: Extraocular movements intact.  Cardiovascular:     Rate and Rhythm: Normal rate.     Pulses: Normal pulses.  Pulmonary:     Effort: Pulmonary effort is normal. No respiratory distress.  Abdominal:     General: There is no distension.     Palpations:  Abdomen is soft.  Musculoskeletal:        General: Tenderness present.     Cervical back: Neck supple.     Right lower leg: No edema.     Left lower leg: No edema.     Comments: Patient has good distal strength with no pain over the greater trochanters.  No clonus or focal weakness.  Ambulates with a forward flexed lumbar spine obvious scoliotic deformity.  Skin:    Findings: No erythema, lesion or rash.  Neurological:     General: No focal deficit present.     Mental Status: She is alert and oriented to person, place, and time.     Sensory: No sensory deficit.     Motor: No weakness or abnormal muscle tone.      Coordination: Coordination normal.  Psychiatric:        Mood and Affect: Mood normal.        Behavior: Behavior normal.      Imaging: No results found.

## 2022-06-29 ENCOUNTER — Ambulatory Visit: Payer: Medicare Other | Admitting: Podiatry

## 2022-07-12 ENCOUNTER — Telehealth: Payer: Self-pay

## 2022-07-12 ENCOUNTER — Ambulatory Visit (INDEPENDENT_AMBULATORY_CARE_PROVIDER_SITE_OTHER): Payer: Medicare Other | Admitting: Podiatry

## 2022-07-12 DIAGNOSIS — B351 Tinea unguium: Secondary | ICD-10-CM | POA: Diagnosis not present

## 2022-07-12 DIAGNOSIS — M79674 Pain in right toe(s): Secondary | ICD-10-CM | POA: Diagnosis not present

## 2022-07-12 DIAGNOSIS — N182 Chronic kidney disease, stage 2 (mild): Secondary | ICD-10-CM

## 2022-07-12 DIAGNOSIS — Q828 Other specified congenital malformations of skin: Secondary | ICD-10-CM

## 2022-07-12 DIAGNOSIS — M216X1 Other acquired deformities of right foot: Secondary | ICD-10-CM | POA: Diagnosis not present

## 2022-07-12 DIAGNOSIS — M79675 Pain in left toe(s): Secondary | ICD-10-CM | POA: Diagnosis not present

## 2022-07-12 NOTE — Progress Notes (Addendum)
This patient returns to my office for at risk foot care.  This patient requires this care by a professional since this patient will be at risk due to having chronic kidney disease stage  3.  Patient says the callus are painful walking and wearing her shoes.  This patient is uable to trim her callus since she cannot reach her feet  due to back problems.  She saw Dr.  Allena Katz for surgical consult.This patient presents for at risk foot care today.  General Appearance  Alert, conversant and in no acute stress.  Vascular  Dorsalis pedis and posterior tibial  pulses are palpable  bilaterally.  Capillary return is within normal limits  bilaterally. Temperature is within normal limits  bilaterally.  Neurologic  Senn-Weinstein monofilament wire test within normal limits  bilaterally. Muscle power within normal limits bilaterally.  Nails Thick disfigured discolored nails with subungual debris hallux nails bilaterally. No evidence of bacterial infection or drainage bilaterally.  Orthopedic  No limitations of motion  feet .  No crepitus or effusions noted.  No bony pathology or digital deformities noted.  Enlarged and painful tibial sesamoid right foot.  Skin  normotropic skin   noted bilaterally.  No signs of infections or ulcers noted.   Porokeratosis sub 1 right foot.  Callus left hallux.  Porokeratosis  B/L.   Onychomycosis  B/L.  Consent was obtained for treatment procedures.   Debridement of calluses both feet with a # 15 blade. after local anesthesia at the site of the painful calluses.  Debridement of nails with nail nipper and dremel tool.  Patient was in such pain I decided to use anesthesia.   Return office visit    9   weeks                   Told patient to return for periodic foot care and evaluation due to potential at risk complications.   Helane Gunther DPM

## 2022-07-12 NOTE — Telephone Encounter (Signed)
Pt called wanting another injection she had on 06/21/22 right L5 TF, pt state it didn't work and she still in pain as before.

## 2022-07-28 ENCOUNTER — Telehealth: Payer: Self-pay | Admitting: Physical Medicine and Rehabilitation

## 2022-07-28 MED ORDER — BACLOFEN 10 MG PO TABS: ORAL_TABLET | ORAL | 0 refills | Status: DC

## 2022-07-28 NOTE — Telephone Encounter (Signed)
Pt called and is wondering if she can get her script?   Cb [phone]

## 2022-07-28 NOTE — Telephone Encounter (Signed)
Tell her sorry got busy and forgot. I have sent it in now. Baclofen

## 2022-09-21 ENCOUNTER — Encounter: Payer: Self-pay | Admitting: Podiatry

## 2022-09-21 ENCOUNTER — Ambulatory Visit (INDEPENDENT_AMBULATORY_CARE_PROVIDER_SITE_OTHER): Payer: Medicare Other | Admitting: Podiatry

## 2022-09-21 DIAGNOSIS — N182 Chronic kidney disease, stage 2 (mild): Secondary | ICD-10-CM

## 2022-09-21 DIAGNOSIS — Q828 Other specified congenital malformations of skin: Secondary | ICD-10-CM

## 2022-09-21 DIAGNOSIS — M216X1 Other acquired deformities of right foot: Secondary | ICD-10-CM

## 2022-09-21 NOTE — Progress Notes (Signed)
This patient returns to my office for at risk foot care.  This patient requires this care by a professional since this patient will be at risk due to having chronic kidney disease stage  3.  Patient says the callus are painful walking and wearing her shoes.  This patient is uable to trim her callus since she cannot reach her feet due to back problems.  This patient presents for at risk foot care today.  General Appearance  Alert, conversant and in no acute stress.  Vascular  Dorsalis pedis and posterior tibial  pulses are palpable  bilaterally.  Capillary return is within normal limits  bilaterally. Temperature is within normal limits  bilaterally.  Neurologic  Senn-Weinstein monofilament wire test within normal limits  bilaterally. Muscle power within normal limits bilaterally.  Nails Thick disfigured discolored nails with subungual debris  from hallux to fifth toes bilaterally. No evidence of bacterial infection or drainage bilaterally.  Orthopedic  No limitations of motion  feet .  No crepitus or effusions noted.  No bony pathology or digital deformities noted.  Enlarged and painful tibial sesamoid right foot.  Skin  normotropic skin   noted bilaterally.  No signs of infections or ulcers noted.   Porokeratosis sub 1 right foot.  Callus left hallux.  Porokeratosis  B/L.  Sesamoiditis sub 1st MPJ right foot.  Consent was obtained for treatment procedures.   Debridement of calluses both feet with a # 15 blade.      Return office visit    9   weeks                   Told patient to return for periodic foot care and evaluation due to potential at risk complications.   Gardiner Barefoot DPM

## 2022-10-06 ENCOUNTER — Telehealth: Payer: Self-pay | Admitting: Physical Medicine and Rehabilitation

## 2022-10-06 DIAGNOSIS — M5416 Radiculopathy, lumbar region: Secondary | ICD-10-CM

## 2022-10-06 NOTE — Telephone Encounter (Signed)
Pt called to set an appt with Dr Alvester Morin. Pt phone number is [phone].

## 2022-10-07 NOTE — Telephone Encounter (Signed)
Patient is having pain in lower back that is radiating down the legs. She states "it feels like her legs are going to fall off". She would like another injection. It did work the last time

## 2022-10-12 ENCOUNTER — Telehealth: Payer: Self-pay | Admitting: Physical Medicine and Rehabilitation

## 2022-10-12 NOTE — Telephone Encounter (Signed)
Pt returned call to [place] to schedule appt. Pt phone number is [phone]

## 2022-10-13 NOTE — Telephone Encounter (Signed)
Tried calling back about scheduling. No answer.

## 2022-10-13 NOTE — Telephone Encounter (Signed)
Pt returning call. Pt requesting callback.  °

## 2022-10-13 NOTE — Telephone Encounter (Signed)
Tried calling. No answer. LMVM 

## 2022-10-19 ENCOUNTER — Other Ambulatory Visit: Payer: Self-pay | Admitting: Physical Medicine and Rehabilitation

## 2022-10-19 ENCOUNTER — Telehealth: Payer: Self-pay | Admitting: Physical Medicine and Rehabilitation

## 2022-10-19 MED ORDER — DIAZEPAM 5 MG PO TABS: ORAL_TABLET | ORAL | 0 refills | Status: DC

## 2022-10-19 NOTE — Telephone Encounter (Signed)
Pt called stating Dr Alvester Morin sends in a medication she takes before and after injection. Pt states she has an upcoming appt and need the 2 pills to take. Please call pt about this matter at [phone] and send to pharmacy on file.

## 2022-10-26 ENCOUNTER — Ambulatory Visit: Payer: Self-pay

## 2022-10-26 ENCOUNTER — Ambulatory Visit (INDEPENDENT_AMBULATORY_CARE_PROVIDER_SITE_OTHER): Payer: Medicare Other | Admitting: Physical Medicine and Rehabilitation

## 2022-10-26 VITALS — BP 138/78 | HR 76

## 2022-10-26 DIAGNOSIS — M5416 Radiculopathy, lumbar region: Secondary | ICD-10-CM

## 2022-10-26 MED ORDER — METHYLPREDNISOLONE ACETATE 80 MG/ML IJ SUSP
80.0000 mg | Freq: Once | INTRAMUSCULAR | Status: AC
  Administered 2022-10-26: 80 mg

## 2022-10-26 NOTE — Patient Instructions (Signed)

## 2022-10-26 NOTE — Progress Notes (Signed)
Numeric Pain Rating Scale and Functional Assessment Average Pain 0   In the last MONTH (on 0-10 scale) has pain interfered with the following?  1. General activity like being  able to carry out your everyday physical activities such as walking, climbing stairs, carrying groceries, or moving a chair?  Rating(6)   +Driver, -BT, -Dye Allergies.  Low back pain on right. Any activity makes pain worse

## 2022-11-09 NOTE — Procedures (Signed)
Lumbosacral Transforaminal Epidural Steroid Injection - Sub-Pedicular Approach with Fluoroscopic Guidance  Patient: Catherine Harrington      Date of Birth: October 18, 1946 MRN: [id number] PCP: Cleatis Polka., MD      Visit Date: 10/26/2022   Universal Protocol:    Date/Time: 10/26/2022  Consent Given By: the patient  Position: PRONE  Additional Comments: Vital signs were monitored before and after the procedure. Patient was prepped and draped in the usual sterile fashion. The correct patient, procedure, and site was verified.   Injection Procedure Details:   Procedure diagnoses: Lumbar radiculopathy [M54.16]    Meds Administered:  Meds ordered this encounter  Medications   methylPREDNISolone acetate (DEPO-MEDROL) injection 80 mg    Laterality: Right  Location/Site: L5  Needle:5.0 in., 22 ga.  Short bevel or Quincke spinal needle  Needle Placement: Transforaminal  Findings:    -Comments: Excellent flow of contrast along the nerve, nerve root and into the epidural space.  Procedure Details: After squaring off the end-plates to get a true AP view, the C-arm was positioned so that an oblique view of the foramen as noted above was visualized. The target area is just inferior to the "nose of the scotty dog" or sub pedicular. The soft tissues overlying this structure were infiltrated with 2-3 ml. of 1% Lidocaine without Epinephrine.  The spinal needle was inserted toward the target using a "trajectory" view along the fluoroscope beam.  Under AP and lateral visualization, the needle was advanced so it did not puncture dura and was located close the 6 O'Clock position of the pedical in AP tracterory. Biplanar projections were used to confirm position. Aspiration was confirmed to be negative for CSF and/or blood. A 1-2 ml. volume of Isovue-250 was injected and flow of contrast was noted at each level. Radiographs were obtained for documentation purposes.   After attaining the desired  flow of contrast documented above, a 0.5 to 1.0 ml test dose of 0.25% Marcaine was injected into each respective transforaminal space.  The patient was observed for 90 seconds post injection.  After no sensory deficits were reported, and normal lower extremity motor function was noted,   the above injectate was administered so that equal amounts of the injectate were placed at each foramen (level) into the transforaminal epidural space.   Additional Comments:  No complications occurred Dressing: 2 x 2 sterile gauze and Band-Aid    Post-procedure details: Patient was observed during the procedure. Post-procedure instructions were reviewed.  Patient left the clinic in stable condition.

## 2022-11-09 NOTE — Progress Notes (Signed)
Catherine Harrington - 76 y.o. female MRN [id number]  Date of birth: 05/15/1946  Office Visit Note: Visit Date: 10/26/2022 PCP: Cleatis Polka., MD Referred by: Tyrell Antonio, MD  Subjective: Chief Complaint  Patient presents with   Lower Back - Pain   HPI:  Catherine Harrington is a 76 y.o. female who comes in today at the request of Ellin Goodie, FNP for planned Right L5-S1 Lumbar Transforaminal epidural steroid injection with fluoroscopic guidance.  The patient has failed conservative care including home exercise, medications, time and activity modification.  This injection will be diagnostic and hopefully therapeutic.  Please see requesting physician notes for further details and justification.   ROS Otherwise per HPI.  Assessment & Plan: Visit Diagnoses:    ICD-10-CM   1. Lumbar radiculopathy  M54.16 XR C-ARM NO REPORT    Epidural Steroid injection    methylPREDNISolone acetate (DEPO-MEDROL) injection 80 mg      Plan: No additional findings.   Meds & Orders:  Meds ordered this encounter  Medications   methylPREDNISolone acetate (DEPO-MEDROL) injection 80 mg    Orders Placed This Encounter  Procedures   XR C-ARM NO REPORT   Epidural Steroid injection    Follow-up: Return for visit to requesting provider as needed.   Procedures: No procedures performed  Lumbosacral Transforaminal Epidural Steroid Injection - Sub-Pedicular Approach with Fluoroscopic Guidance  Patient: Catherine Harrington      Date of Birth: 1946/02/22 MRN: [id number] PCP: Cleatis Polka., MD      Visit Date: 10/26/2022   Universal Protocol:    Date/Time: 10/26/2022  Consent Given By: the patient  Position: PRONE  Additional Comments: Vital signs were monitored before and after the procedure. Patient was prepped and draped in the usual sterile fashion. The correct patient, procedure, and site was verified.   Injection Procedure Details:   Procedure diagnoses: Lumbar radiculopathy  [M54.16]    Meds Administered:  Meds ordered this encounter  Medications   methylPREDNISolone acetate (DEPO-MEDROL) injection 80 mg    Laterality: Right  Location/Site: L5  Needle:5.0 in., 22 ga.  Short bevel or Quincke spinal needle  Needle Placement: Transforaminal  Findings:    -Comments: Excellent flow of contrast along the nerve, nerve root and into the epidural space.  Procedure Details: After squaring off the end-plates to get a true AP view, the C-arm was positioned so that an oblique view of the foramen as noted above was visualized. The target area is just inferior to the "nose of the scotty dog" or sub pedicular. The soft tissues overlying this structure were infiltrated with 2-3 ml. of 1% Lidocaine without Epinephrine.  The spinal needle was inserted toward the target using a "trajectory" view along the fluoroscope beam.  Under AP and lateral visualization, the needle was advanced so it did not puncture dura and was located close the 6 O'Clock position of the pedical in AP tracterory. Biplanar projections were used to confirm position. Aspiration was confirmed to be negative for CSF and/or blood. A 1-2 ml. volume of Isovue-250 was injected and flow of contrast was noted at each level. Radiographs were obtained for documentation purposes.   After attaining the desired flow of contrast documented above, a 0.5 to 1.0 ml test dose of 0.25% Marcaine was injected into each respective transforaminal space.  The patient was observed for 90 seconds post injection.  After no sensory deficits were reported, and normal lower extremity motor function was noted,   the above  injectate was administered so that equal amounts of the injectate were placed at each foramen (level) into the transforaminal epidural space.   Additional Comments:  No complications occurred Dressing: 2 x 2 sterile gauze and Band-Aid    Post-procedure details: Patient was observed during the  procedure. Post-procedure instructions were reviewed.  Patient left the clinic in stable condition.    Clinical History: EXAM: MRI LUMBAR SPINE WITHOUT CONTRAST   TECHNIQUE: Multiplanar, multisequence MR imaging of the lumbar spine was performed. No intravenous contrast was administered.   COMPARISON:  05/09/2017   FINDINGS: Segmentation: 5 lumbar type vertebrae based on the available coverage   Alignment: Marked dextroscoliosis with rightward translation at L4-5. The curvature is centered at the level of L3.   Vertebrae: Degenerative fatty marrow conversion at L2-L5 primarily. No fracture, discitis, or aggressive bone lesion   Conus medullaris and cauda equina: Conus extends to the T12 level. Conus and cauda equina appear normal.   Paraspinal and other soft tissues: Marked muscular atrophy involving the intrinsic back muscles and bilateral psoas. No incidental inflammation or mass is seen about the spine.   Disc levels:   T12- L1: Unremarkable.   L1-L2: Disc collapse and endplate degeneration with ridging. No compressive spinal or foraminal stenosis   L2-L3: Asymmetric leftward disc collapse. Degenerative facet spurring asymmetric to the left. The canal and foramina are patent. The left subarticular recess is effaced.   L3-L4: Disc collapse asymmetric to the left. Intervertebral ankylosis has occurred remotely. Facet spurring asymmetric to the left where there is moderate foraminal narrowing. Left subarticular recess narrowing that could affect the L4 nerve root.   L4-L5: Disc collapse and endplate degeneration. Degenerative facet spurring asymmetric to the right. Advanced right foraminal stenosis. Right subarticular recess narrowing that could certainly affect the L5 nerve root. More moderate left subarticular recess narrowing.   L5-S1:Degenerative facet spurring with small joint effusions. Preserved disc height and hydration compared to the other levels. No  neural compression.   IMPRESSION: 1. No acute or interval finding when compared to 2018. 2. Advanced lumbar spine degeneration and dextroscoliosis. 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 3. L4-5 right foraminal and subarticular recess impingement. 4. On the left there is subarticular recess stenosis at L2-3 to L4-5. 5. Advanced and generalized muscular atrophy.     Electronically Signed   By: Marnee Spring M.D.   On: 09/12/2020 18:55     Objective:  VS:  HT:    WT:   BMI:     BP:138/78  HR:76bpm  TEMP: ( )  RESP:  Physical Exam Vitals and nursing note reviewed.  Constitutional:      General: She is not in acute distress.    Appearance: Normal appearance. She is not ill-appearing.  HENT:     Head: Normocephalic and atraumatic.     Right Ear: External ear normal.     Left Ear: External ear normal.  Eyes:     Extraocular Movements: Extraocular movements intact.  Cardiovascular:     Rate and Rhythm: Normal rate.     Pulses: Normal pulses.  Pulmonary:     Effort: Pulmonary effort is normal. No respiratory distress.  Abdominal:     General: There is no distension.     Palpations: Abdomen is soft.  Musculoskeletal:        General: Tenderness present.     Cervical back: Neck supple.     Right lower leg: No edema.     Left lower leg: No edema.  Comments: Patient has good distal strength with no pain over the greater trochanters.  No clonus or focal weakness.  Skin:    Findings: No erythema, lesion or rash.  Neurological:     General: No focal deficit present.     Mental Status: She is alert and oriented to person, place, and time.     Sensory: No sensory deficit.     Motor: No weakness or abnormal muscle tone.     Coordination: Coordination normal.  Psychiatric:        Mood and Affect: Mood normal.        Behavior: Behavior normal.      Imaging: No results found.

## 2022-11-30 ENCOUNTER — Telehealth: Payer: Self-pay | Admitting: Hematology

## 2022-11-30 NOTE — Telephone Encounter (Signed)
Cancelled appointment per patients request. At this time, patient did not want to reschedule the lab appointment.

## 2022-12-02 ENCOUNTER — Inpatient Hospital Stay: Payer: Medicare Other

## 2022-12-14 ENCOUNTER — Encounter: Payer: Self-pay | Admitting: Hematology

## 2022-12-21 ENCOUNTER — Encounter: Payer: Self-pay | Admitting: Podiatry

## 2022-12-21 ENCOUNTER — Encounter: Payer: Self-pay | Admitting: Hematology

## 2022-12-21 ENCOUNTER — Ambulatory Visit (INDEPENDENT_AMBULATORY_CARE_PROVIDER_SITE_OTHER): Payer: Medicare Other | Admitting: Podiatry

## 2022-12-21 DIAGNOSIS — M79674 Pain in right toe(s): Secondary | ICD-10-CM

## 2022-12-21 DIAGNOSIS — Q828 Other specified congenital malformations of skin: Secondary | ICD-10-CM

## 2022-12-21 DIAGNOSIS — M216X1 Other acquired deformities of right foot: Secondary | ICD-10-CM | POA: Diagnosis not present

## 2022-12-21 DIAGNOSIS — M79675 Pain in left toe(s): Secondary | ICD-10-CM

## 2022-12-21 DIAGNOSIS — B351 Tinea unguium: Secondary | ICD-10-CM

## 2022-12-21 NOTE — Progress Notes (Signed)
This patient returns to my office for at risk foot care.  This patient requires this care by a professional since this patient will be at risk due to having chronic kidney disease stage  3.  Patient says the callus are painful walking and wearing her shoes.  This patient is uable to trim her callus since she cannot reach her feet due to back problems.  This patient presents for at risk foot care today.  General Appearance  Alert, conversant and in no acute stress.  Vascular  Dorsalis pedis and posterior tibial  pulses are palpable  bilaterally.  Capillary return is within normal limits  bilaterally. Temperature is within normal limits  bilaterally.  Neurologic  Senn-Weinstein monofilament wire test within normal limits  bilaterally. Muscle power within normal limits bilaterally.  Nails Thick disfigured discolored nails with subungual debris  from hallux to fifth toes bilaterally. No evidence of bacterial infection or drainage bilaterally.  Orthopedic  No limitations of motion  feet .  No crepitus or effusions noted.  No bony pathology or digital deformities noted.  Enlarged and painful tibial sesamoid right foot.  Skin  normotropic skin   noted bilaterally.  No signs of infections or ulcers noted.   Porokeratosis sub 1 right foot.  Callus left hallux.  Porokeratosis  B/L.  Sesamoiditis sub 1st MPJ right foot.  Consent was obtained for treatment procedures.   Debridement of calluses both feet with a dremel tool and # 15 blade.      Return office visit    9   weeks                   Told patient to return for periodic foot care and evaluation due to potential at risk complications.   Gardiner Barefoot DPM

## 2023-01-05 ENCOUNTER — Telehealth: Payer: Self-pay | Admitting: Physical Medicine and Rehabilitation

## 2023-01-05 DIAGNOSIS — M25569 Pain in unspecified knee: Secondary | ICD-10-CM

## 2023-01-05 NOTE — Telephone Encounter (Signed)
Spoke with patient and she is wanting a referral for knee pain. Referral sent to Dr. Marlou Sa

## 2023-01-05 NOTE — Telephone Encounter (Signed)
Patient called. Would like to speak with Dr. Ernestina Patches. Her call back number is [phone]

## 2023-01-10 ENCOUNTER — Encounter: Payer: Self-pay | Admitting: Hematology

## 2023-01-11 ENCOUNTER — Encounter: Payer: Self-pay | Admitting: Hematology

## 2023-01-17 ENCOUNTER — Ambulatory Visit (INDEPENDENT_AMBULATORY_CARE_PROVIDER_SITE_OTHER): Payer: Medicare Other | Admitting: Orthopedic Surgery

## 2023-01-17 ENCOUNTER — Ambulatory Visit (INDEPENDENT_AMBULATORY_CARE_PROVIDER_SITE_OTHER): Payer: Medicare Other

## 2023-01-17 ENCOUNTER — Encounter: Payer: Self-pay | Admitting: Orthopedic Surgery

## 2023-01-17 ENCOUNTER — Encounter: Payer: Self-pay | Admitting: Hematology

## 2023-01-17 DIAGNOSIS — M25561 Pain in right knee: Secondary | ICD-10-CM | POA: Diagnosis not present

## 2023-01-17 DIAGNOSIS — M542 Cervicalgia: Secondary | ICD-10-CM

## 2023-01-17 DIAGNOSIS — G8929 Other chronic pain: Secondary | ICD-10-CM

## 2023-01-17 DIAGNOSIS — M1711 Unilateral primary osteoarthritis, right knee: Secondary | ICD-10-CM | POA: Diagnosis not present

## 2023-01-17 MED ORDER — BACLOFEN 10 MG PO TABS: ORAL_TABLET | ORAL | 0 refills | Status: AC

## 2023-01-17 NOTE — Progress Notes (Signed)
Office Visit Note   Patient: Catherine Harrington           Date of Birth: 11-06-1946           MRN: [id number] Visit Date: 01/17/2023 Requested by: Catherine Sinning, MD [address] [place],  [place] [address] PCP: Catherine Organ., MD  Subjective: Chief Complaint  Patient presents with   Right Knee - Pain    HPI: Catherine Harrington is a 77 y.o. female who presents to the office reporting right knee pain and neck pain.  Regarding her neck the patient states "I slept wrong".  This happened 2 days ago.  Previously had the same issue which resolved.  She has been getting dry needling for this type of problem.  She has a history of liver issues with increased iron.  Patient also reports right knee pain.  Pain been going on for a year.  Reports locking swelling and pain which wakes her from sleep at night.  She has tried elastic sleeve but that gives her tightness and swelling in the calf.  She has seen rheumatologist which did not really help her resolve any of the symptoms.  Takes baclofen for her symptoms..                ROS: All systems reviewed are negative as they relate to the chief complaint within the history of present illness.  Patient denies fevers or chills.  Assessment & Plan: Visit Diagnoses:  1. Neck pain   2. 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  I think she needs a cervical spine MRI to evaluate her neck based on atypical radiographic appearance down in the lower cervical levels.  The knee is aspirated and injected today.  Will see if that helps and if not we may need to consider further imaging on that as well.  It may also end up being an arthritic problem which she will have to decide if she needs knee replacement for or not.  Follow-up after that cervical spine MRI.  Follow-Up Instructions: No follow-ups on file.   Orders:  Orders Placed This Encounter  Procedures   XR Cervical Spine 2  or 3 views   XR Knee 1-2 Views Right   MR Cervical Spine w/o contrast   Meds ordered this encounter  Medications   baclofen (LIORESAL) 10 MG tablet    Sig: 1/2 tablet po q 8 hours prn    Dispense:  30 each    Refill:  0      Procedures: Large Joint Inj: R knee on 01/17/2023 9:24 PM Indications: diagnostic evaluation, joint swelling and pain Details: 18 G 1.5 in needle, superolateral approach  Arthrogram: No  Medications: 5 mL lidocaine 1 %; 40 mg methylPREDNISolone acetate 40 MG/ML; 4 mL bupivacaine 0.25 % Outcome: tolerated well, no immediate complications Procedure, treatment alternatives, risks and benefits explained, specific risks discussed. Consent was given by the patient. Immediately prior to procedure a time out was called to verify the correct patient, procedure, equipment, support staff and site/side marked as required. Patient was prepped and draped in the usual sterile fashion.       Clinical Data: No additional findings.  Objective: Vital Signs: There were no vitals taken for this visit.  Physical Exam:  Constitutional: Patient appears well-developed HEENT:  Head: Normocephalic Eyes:EOM are normal Neck: Normal range of motion Cardiovascular: Normal rate Pulmonary/chest: Effort normal  Neurologic: Patient is alert Skin: Skin is warm Psychiatric: Patient has normal mood and affect  Ortho Exam: Ortho exam demonstrates mild effusion in the right knee with no effusion in the left knee.  She has intact extensor mechanism and no groin pain on the right or left-hand side with internal/external rotation of the leg.  No masses lymphadenopathy or skin changes noted in that knee region.  She has diminished passive and active range of motion in the cervical spine.  5 out of 5 grip EPL FPL interosseous resection extension bicep triceps and deltoid strength.  Overall the patient appears thin for her height.  Mild muscle wasting but overall she has fairly symmetric  strength.  Specialty Comments:  EXAM: MRI LUMBAR SPINE WITHOUT CONTRAST   TECHNIQUE: Multiplanar, multisequence MR imaging of the lumbar spine was performed. No intravenous contrast was administered.   COMPARISON:  05/09/2017   FINDINGS: Segmentation: 5 lumbar type vertebrae based on the available coverage   Alignment: Marked dextroscoliosis with rightward translation at L4-5. The curvature is centered at the level of L3.   Vertebrae: Degenerative fatty marrow conversion at L2-L5 primarily. No fracture, discitis, or aggressive bone lesion   Conus medullaris and cauda equina: Conus extends to the T12 level. Conus and cauda equina appear normal.   Paraspinal and other soft tissues: Marked muscular atrophy involving the intrinsic back muscles and bilateral psoas. No incidental inflammation or mass is seen about the spine.   Disc levels:   T12- L1: Unremarkable.   L1-L2: Disc collapse and endplate degeneration with ridging. No compressive spinal or foraminal stenosis   L2-L3: Asymmetric leftward disc collapse. Degenerative facet spurring asymmetric to the left. The canal and foramina are patent. The left subarticular recess is effaced.   L3-L4: Disc collapse asymmetric to the left. Intervertebral ankylosis has occurred remotely. Facet spurring asymmetric to the left where there is moderate foraminal narrowing. Left subarticular recess narrowing that could affect the L4 nerve root.   L4-L5: Disc collapse and endplate degeneration. Degenerative facet spurring asymmetric to the right. Advanced right foraminal stenosis. Right subarticular recess narrowing that could certainly affect the L5 nerve root. More moderate left subarticular recess narrowing.   L5-S1:Degenerative facet spurring with small joint effusions. Preserved disc height and hydration compared to the other levels. No neural compression.   IMPRESSION: 1. No acute or interval finding when compared to  2018. 2. Advanced lumbar spine degeneration and dextroscoliosis. 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 3. L4-5 right foraminal and subarticular recess impingement. 4. On the left there is subarticular recess stenosis at L2-3 to L4-5. 5. Advanced and generalized muscular atrophy.     Electronically Signed   By: Monte Fantasia M.D.   On: 09/12/2020 18:55  Imaging: No results found.   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   Macrocytosis 08/16/2013   Syncope 08/16/2013   DIVERTICULOSIS, COLON 11/04/2010   Past Medical History:  Diagnosis Date   Acute  right flank pain 06/29/2016   Arthritis    "related to the lupus; joints" (08/16/2013)   Chronic renal insufficiency, stage III (moderate) ([place]) 06/29/2016   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   Family history of anesthesia complication    "daughter doesn't wake up easy from it" (08/16/2013)   Fibrosis of liver 04/18/2018   Hemochromatosis 05/18/2016   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   Hypertension    Iron deficiency anemia    Lumbar disc disease    Lupus ([place])    "just the rash and arthritis" (08/16/2013)   Macrocytic anemia 06/29/2016   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/18/2016   Scoliosis    Syncope and collapse    "lost consciousness ~ 4-5 seconds; didn't hurt myself" (08/16/2013)    Family History  Problem  Relation Age of Onset   Hypertension Mother    Hypertension Father     Past Surgical History:  Procedure Laterality Date   CATARACT EXTRACTION Bilateral 10/19 11/19   CESAREAN SECTION  1967; [place]  2013   " infection under bridge cleaned out, etc" (08/16/2013)   TUBAL LIGATION     Social History   Occupational History   Occupation: retired  Tobacco Use   Smoking status: Every Day    Packs/day: 1.00    Years: 48.00    Total pack years: 48.00    Types: Cigarettes   Smokeless tobacco: Never  Substance and Sexual Activity   Alcohol use: Yes    Alcohol/week: 21.0 standard drinks of alcohol    Types: 21 Cans of beer per week    Comment:  3-4 drinks daily sometimes.   Drug use: No   Sexual activity: Not Currently    Partners: Male    Birth control/protection: Post-menopausal

## 2023-01-19 ENCOUNTER — Encounter: Payer: Self-pay | Admitting: Orthopedic Surgery

## 2023-01-19 MED ORDER — LIDOCAINE HCL 1 % IJ SOLN
5.0000 mL | INTRAMUSCULAR | Status: AC | PRN
  Administered 2023-01-17: 5 mL

## 2023-01-19 MED ORDER — BUPIVACAINE HCL 0.25 % IJ SOLN
4.0000 mL | INTRAMUSCULAR | Status: AC | PRN
  Administered 2023-01-17: 4 mL via INTRA_ARTICULAR

## 2023-01-19 MED ORDER — METHYLPREDNISOLONE ACETATE 40 MG/ML IJ SUSP
40.0000 mg | INTRAMUSCULAR | Status: AC | PRN
  Administered 2023-01-17: 40 mg via INTRA_ARTICULAR

## 2023-02-14 ENCOUNTER — Ambulatory Visit
Admission: RE | Admit: 2023-02-14 | Discharge: 2023-02-14 | Disposition: A | Discharge status: Home / Self Care | Payer: Medicare Other | Source: Ambulatory Visit | Attending: Orthopedic Surgery | Admitting: Orthopedic Surgery

## 2023-02-14 ENCOUNTER — Encounter: Payer: Self-pay | Admitting: Hematology

## 2023-02-14 DIAGNOSIS — M542 Cervicalgia: Secondary | ICD-10-CM

## 2023-03-09 ENCOUNTER — Telehealth: Payer: Self-pay

## 2023-03-09 NOTE — Telephone Encounter (Signed)
Pls schedule OV to review scan

## 2023-03-09 NOTE — Progress Notes (Signed)
Did she have a follow-up?

## 2023-03-09 NOTE — Telephone Encounter (Signed)
-----   Message from Cammy Copa, MD sent at 03/09/2023 12:54 PM EDT ----- Did she have a follow-up?

## 2023-03-21 ENCOUNTER — Ambulatory Visit (INDEPENDENT_AMBULATORY_CARE_PROVIDER_SITE_OTHER): Payer: Medicare Other | Admitting: Orthopedic Surgery

## 2023-03-21 ENCOUNTER — Encounter: Payer: Self-pay | Admitting: Hematology

## 2023-03-21 ENCOUNTER — Telehealth: Payer: Self-pay

## 2023-03-21 DIAGNOSIS — M542 Cervicalgia: Secondary | ICD-10-CM | POA: Diagnosis not present

## 2023-03-21 NOTE — Telephone Encounter (Signed)
Auth needed for right knee gel  

## 2023-03-22 ENCOUNTER — Ambulatory Visit (INDEPENDENT_AMBULATORY_CARE_PROVIDER_SITE_OTHER): Payer: Medicare Other | Admitting: Podiatry

## 2023-03-22 ENCOUNTER — Encounter: Payer: Self-pay | Admitting: Orthopedic Surgery

## 2023-03-22 ENCOUNTER — Encounter: Payer: Self-pay | Admitting: Podiatry

## 2023-03-22 DIAGNOSIS — Q828 Other specified congenital malformations of skin: Secondary | ICD-10-CM

## 2023-03-22 DIAGNOSIS — M216X1 Other acquired deformities of right foot: Secondary | ICD-10-CM | POA: Diagnosis not present

## 2023-03-22 NOTE — Progress Notes (Unsigned)
Office Visit Note   Patient: Catherine Harrington           Date of Birth: 08-10-1946           MRN: [id number] Visit Date: 03/21/2023 Requested by: Cleatis Polka., MD [address] [place],  [place] [address] PCP: Cleatis Polka., MD  Subjective: Chief Complaint  Patient presents with   Other     Scan review    HPI: Catherine Harrington is a 77 y.o. female who presents to the office reporting neck pain as well as continued right knee pain.  Overall she states her neck is better.  She has had an MRI of her cervical spine performed which did not show any significant changes compared with scan from a year ago.  She states that when the cervical spine pain hits that it hurts but it does not hurt all the time.  Takes Tylenol as needed.  Mostly for her hands.  She also reports right knee pain with significant pain the day after the injection.  In the knee injection kicked in and she did well but now the pain comes and goes.  Creatinine was 1.2 last year so she cannot really do too much with anti-inflammatories and she also has liver issues from iron deficiency problems..                ROS: All systems reviewed are negative as they relate to the chief complaint within the history of present illness.  Patient denies fevers or chills.  Assessment & Plan: Visit Diagnoses:  1. Neck pain     Plan: Impression is cervical spine stable with no indication for intervention at this time.  The right knee has recurrent effusion and I think it would be good to preapproved for gel injection in the right knee.  Will see her back for that injection when this current shot affects wear off.This patient is diagnosed with osteoarthritis of the knee(s).    Radiographs show evidence of joint space narrowing, osteophytes, subchondral sclerosis and/or subchondral cysts.  This patient has knee pain which interferes with functional and activities of daily living.    This patient has experienced inadequate  response, adverse effects and/or intolerance with conservative treatments such as acetaminophen, NSAIDS, topical creams, physical therapy or regular exercise, knee bracing and/or weight loss.   This patient has experienced inadequate response or has a contraindication to intra articular steroid injections for at least 3 months.   This patient is not scheduled to have a total knee replacement within 6 months of starting treatment with viscosupplementation.   Follow-Up Instructions: No follow-ups on file.   Orders:  No orders of the defined types were placed in this encounter.  No orders of the defined types were placed in this encounter.     Procedures: No procedures performed   Clinical Data: No additional findings.  Objective: Vital Signs: There were no vitals taken for this visit.  Physical Exam:  Constitutional: Patient appears well-developed HEENT:  Head: Normocephalic Eyes:EOM are normal Neck: Normal range of motion Cardiovascular: Normal rate Pulmonary/chest: Effort normal Neurologic: Patient is alert Skin: Skin is warm Psychiatric: Patient has normal mood and affect  Ortho Exam: Ortho exam demonstrates pretty reasonable cervical spine exam with good EPL FPL interosseous wrist flexion extension bicep triceps and deltoid function.  No muscle atrophy although she does have a very thin appearance.  Right knee has trace effusion but unchanged range of motion.  Extensor mechanism intact.  Medial greater than lateral joint line tenderness.  Specialty Comments:  EXAM: MRI LUMBAR SPINE WITHOUT CONTRAST   TECHNIQUE: Multiplanar, multisequence MR imaging of the lumbar spine was performed. No intravenous contrast was administered.   COMPARISON:  05/09/2017   FINDINGS: Segmentation: 5 lumbar type vertebrae based on the available coverage   Alignment: Marked dextroscoliosis with rightward translation at L4-5. The curvature is centered at the level of L3.   Vertebrae:  Degenerative fatty marrow conversion at L2-L5 primarily. No fracture, discitis, or aggressive bone lesion   Conus medullaris and cauda equina: Conus extends to the T12 level. Conus and cauda equina appear normal.   Paraspinal and other soft tissues: Marked muscular atrophy involving the intrinsic back muscles and bilateral psoas. No incidental inflammation or mass is seen about the spine.   Disc levels:   T12- L1: Unremarkable.   L1-L2: Disc collapse and endplate degeneration with ridging. No compressive spinal or foraminal stenosis   L2-L3: Asymmetric leftward disc collapse. Degenerative facet spurring asymmetric to the left. The canal and foramina are patent. The left subarticular recess is effaced.   L3-L4: Disc collapse asymmetric to the left. Intervertebral ankylosis has occurred remotely. Facet spurring asymmetric to the left where there is moderate foraminal narrowing. Left subarticular recess narrowing that could affect the L4 nerve root.   L4-L5: Disc collapse and endplate degeneration. Degenerative facet spurring asymmetric to the right. Advanced right foraminal stenosis. Right subarticular recess narrowing that could certainly affect the L5 nerve root. More moderate left subarticular recess narrowing.   L5-S1:Degenerative facet spurring with small joint effusions. Preserved disc height and hydration compared to the other levels. No neural compression.   IMPRESSION: 1. No acute or interval finding when compared to 2018. 2. Advanced lumbar spine degeneration and dextroscoliosis. 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 3. L4-5 right foraminal and subarticular recess impingement. 4. On the left there is subarticular recess stenosis at L2-3 to L4-5. 5. Advanced and generalized muscular atrophy.     Electronically Signed   By: Marnee Spring M.D.   On: 09/12/2020 18:55  Imaging: No results found.   PMFS History: Patient Active Problem List    Diagnosis Date Noted   Pain due to onychomycosis of toenails of both feet 08/27/2020   Myofascial pain syndrome 03/20/2020   Scoliosis of thoracolumbar spine 02/18/2020   Spinal stenosis of lumbar region without neurogenic claudication 02/18/2020   Porokeratosis 01/16/2020   Fibrosis of liver 04/18/2018   Macrocytic anemia 06/29/2016   Chronic renal insufficiency, stage III (moderate) 06/29/2016   Acute right flank pain 06/29/2016   Chronic renal insufficiency 06/29/2016   Hemochromatosis 05/18/2016   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/18/2016   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   Macrocytosis 08/16/2013   Syncope 08/16/2013   DIVERTICULOSIS, COLON 11/04/2010   Past Medical History:  Diagnosis Date   Acute right flank pain 06/29/2016   Arthritis    "related to the lupus; joints" (08/16/2013)   Chronic renal insufficiency, stage III (moderate) (HCC) 06/29/2016   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   Family history of anesthesia complication    "daughter doesn't wake up easy from it" (08/16/2013)   Fibrosis of liver 04/18/2018   Hemochromatosis 05/18/2016   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   Hypertension    Iron deficiency anemia    Lumbar disc disease    Lupus (HCC)    "just the rash and arthritis" (08/16/2013)   Macrocytic anemia 06/29/2016   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/18/2016   Scoliosis  Syncope and collapse    "lost consciousness ~ 4-5 seconds; didn't hurt myself" (08/16/2013)    Family History  Problem Relation Age of Onset   Hypertension Mother    Hypertension Father     Past Surgical History:  Procedure Laterality Date   CATARACT EXTRACTION Bilateral 10/19 11/19   CESAREAN SECTION  1967; 1971   MOUTH SURGERY  2013   " infection under bridge cleaned out, etc" (08/16/2013)   TUBAL LIGATION     Social History   Occupational History   Occupation: retired  Tobacco Use   Smoking status: Every Day    Packs/day: 1.00    Years: 48.00    Additional pack  years: 0.00    Total pack years: 48.00    Types: Cigarettes   Smokeless tobacco: Never  Substance and Sexual Activity   Alcohol use: Yes    Alcohol/week: 21.0 standard drinks of alcohol    Types: 21 Cans of beer per week    Comment:  3-4 drinks daily sometimes.   Drug use: No   Sexual activity: Not Currently    Partners: Male    Birth control/protection: Post-menopausal

## 2023-03-22 NOTE — Progress Notes (Signed)
This patient returns to my office for at risk foot care.  This patient requires this care by a professional since this patient will be at risk due to having chronic kidney disease stage  3.  Patient says the callus are painful walking and wearing her shoes.  This patient is uable to trim her callus since she cannot reach her feet due to back problems.  This patient presents for at risk foot care today.  General Appearance  Alert, conversant and in no acute stress.  Vascular  Dorsalis pedis and posterior tibial  pulses are palpable  bilaterally.  Capillary return is within normal limits  bilaterally. Temperature is within normal limits  bilaterally.  Neurologic  Senn-Weinstein monofilament wire test within normal limits  bilaterally. Muscle power within normal limits bilaterally.  Nails Thick disfigured discolored nails with subungual debris  from hallux to fifth toes bilaterally. No evidence of bacterial infection or drainage bilaterally.  Orthopedic  No limitations of motion  feet .  No crepitus or effusions noted.  No bony pathology or digital deformities noted.  Enlarged and painful tibial sesamoid right foot.  Skin  normotropic skin   noted bilaterally.  No signs of infections or ulcers noted.   Porokeratosis sub 1 right foot.  Callus left hallux.  Porokeratosis  B/L.  Sesamoiditis sub 1st MPJ right foot.  Consent was obtained for treatment procedures.   Debridement of calluses both feet with a dremel tool and # 15 blade.      Return office visit    9   weeks                   Told patient to return for periodic foot care and evaluation due to potential at risk complications.   Jacquline Terrill DPM  

## 2023-03-23 NOTE — Telephone Encounter (Signed)
VOB submitted for Orthovisc, right knee   

## 2023-04-01 ENCOUNTER — Other Ambulatory Visit: Payer: Self-pay | Admitting: Cardiovascular Disease

## 2023-04-01 DIAGNOSIS — Z0000 Encounter for general adult medical examination without abnormal findings: Secondary | ICD-10-CM

## 2023-04-05 ENCOUNTER — Ambulatory Visit: Payer: BLUE CROSS/BLUE SHIELD

## 2023-04-13 ENCOUNTER — Ambulatory Visit
Admission: RE | Admit: 2023-04-13 | Discharge: 2023-04-13 | Disposition: A | Discharge status: Home / Self Care | Payer: Medicare Other | Source: Ambulatory Visit | Attending: Cardiovascular Disease | Admitting: Cardiovascular Disease

## 2023-04-13 DIAGNOSIS — Z0000 Encounter for general adult medical examination without abnormal findings: Secondary | ICD-10-CM

## 2023-04-28 ENCOUNTER — Encounter: Payer: Self-pay | Admitting: Hematology

## 2023-04-29 ENCOUNTER — Other Ambulatory Visit: Payer: Self-pay | Admitting: Internal Medicine

## 2023-04-29 DIAGNOSIS — F17209 Nicotine dependence, unspecified, with unspecified nicotine-induced disorders: Secondary | ICD-10-CM

## 2023-05-11 ENCOUNTER — Other Ambulatory Visit: Payer: Self-pay

## 2023-05-11 DIAGNOSIS — M1711 Unilateral primary osteoarthritis, right knee: Secondary | ICD-10-CM

## 2023-05-12 ENCOUNTER — Ambulatory Visit (HOSPITAL_COMMUNITY)
Admission: RE | Admit: 2023-05-12 | Discharge: 2023-05-12 | Disposition: A | Discharge status: Home / Self Care | Payer: Medicare Other | Source: Ambulatory Visit | Attending: Vascular Surgery | Admitting: Vascular Surgery

## 2023-05-12 ENCOUNTER — Other Ambulatory Visit (HOSPITAL_COMMUNITY): Payer: Self-pay | Admitting: Internal Medicine

## 2023-05-12 DIAGNOSIS — I6521 Occlusion and stenosis of right carotid artery: Secondary | ICD-10-CM | POA: Diagnosis present

## 2023-05-18 ENCOUNTER — Ambulatory Visit
Admission: RE | Admit: 2023-05-18 | Discharge: 2023-05-18 | Disposition: A | Discharge status: Home / Self Care | Payer: Medicare Other | Source: Ambulatory Visit | Attending: Internal Medicine | Admitting: Internal Medicine

## 2023-05-18 DIAGNOSIS — F17209 Nicotine dependence, unspecified, with unspecified nicotine-induced disorders: Secondary | ICD-10-CM

## 2023-05-23 ENCOUNTER — Telehealth: Payer: Self-pay | Admitting: Orthopedic Surgery

## 2023-05-23 NOTE — Telephone Encounter (Signed)
Patient called asked if she can get a Rx filled for Valium prior to her injection. Patient said to make sure Rx is per approved.  Patient uses [facility] on [address]. The number to  contact patient is [phone]

## 2023-05-24 ENCOUNTER — Ambulatory Visit (INDEPENDENT_AMBULATORY_CARE_PROVIDER_SITE_OTHER): Payer: Medicare Other | Admitting: Podiatry

## 2023-05-24 ENCOUNTER — Encounter: Payer: Self-pay | Admitting: Podiatry

## 2023-05-24 DIAGNOSIS — M216X1 Other acquired deformities of right foot: Secondary | ICD-10-CM

## 2023-05-24 DIAGNOSIS — Q828 Other specified congenital malformations of skin: Secondary | ICD-10-CM

## 2023-05-24 NOTE — Progress Notes (Signed)
This patient returns to my office for at risk foot care.  This patient requires this care by a professional since this patient will be at risk due to having chronic kidney disease stage  3.  Patient says the callus are painful walking and wearing her shoes.  This patient is uable to trim her callus since she cannot reach her feet due to back problems.  This patient presents for at risk foot care today.  General Appearance  Alert, conversant and in no acute stress.  Vascular  Dorsalis pedis and posterior tibial  pulses are palpable  bilaterally.  Capillary return is within normal limits  bilaterally. Temperature is within normal limits  bilaterally.  Neurologic  Senn-Weinstein monofilament wire test within normal limits  bilaterally. Muscle power within normal limits bilaterally.  Nails Thick disfigured discolored nails with subungual debris  from hallux to fifth toes bilaterally. No evidence of bacterial infection or drainage bilaterally.  Orthopedic  No limitations of motion  feet .  No crepitus or effusions noted.  No bony pathology or digital deformities noted.  Enlarged and painful tibial sesamoid right foot.  Skin  normotropic skin   noted bilaterally.  No signs of infections or ulcers noted.   Porokeratosis sub 1 right foot.  Callus left hallux.  Porokeratosis  B/L.  Sesamoiditis sub 1st MPJ right foot.  Consent was obtained for treatment procedures.   Debridement of calluses both feet with a dremel tool and # 15 blade.   Nails done as a courtesy.   Return office visit    9   weeks                   Told patient to return for periodic foot care and evaluation due to potential at risk complications.   Helane Gunther DPM

## 2023-05-25 ENCOUNTER — Telehealth: Payer: Self-pay | Admitting: Orthopedic Surgery

## 2023-05-25 ENCOUNTER — Other Ambulatory Visit: Payer: Self-pay | Admitting: Surgical

## 2023-05-25 MED ORDER — DIAZEPAM 5 MG PO TABS: ORAL_TABLET | ORAL | 0 refills | Status: AC

## 2023-05-25 NOTE — Telephone Encounter (Signed)
Pt called about an up date on script of valium before her appt for injection. Please call pt about this matter at [phone].Marland Kitchen Please send to pharmacy on file.

## 2023-05-25 NOTE — Telephone Encounter (Signed)
Sent in

## 2023-05-26 NOTE — Telephone Encounter (Signed)
Lvm advising  

## 2023-05-30 ENCOUNTER — Ambulatory Visit (INDEPENDENT_AMBULATORY_CARE_PROVIDER_SITE_OTHER): Payer: Medicare Other | Admitting: Orthopedic Surgery

## 2023-05-30 DIAGNOSIS — M1711 Unilateral primary osteoarthritis, right knee: Secondary | ICD-10-CM

## 2023-05-31 ENCOUNTER — Telehealth: Payer: Self-pay | Admitting: Orthopedic Surgery

## 2023-05-31 ENCOUNTER — Encounter: Payer: Self-pay | Admitting: Orthopedic Surgery

## 2023-05-31 ENCOUNTER — Other Ambulatory Visit: Payer: Self-pay | Admitting: Physician Assistant

## 2023-05-31 DIAGNOSIS — M1711 Unilateral primary osteoarthritis, right knee: Secondary | ICD-10-CM

## 2023-05-31 MED ORDER — MELOXICAM 7.5 MG PO TABS: 7.5000 mg | ORAL_TABLET | Freq: Every day | ORAL | 0 refills | Status: DC | PRN

## 2023-05-31 MED ORDER — LIDOCAINE HCL 1 % IJ SOLN
5.0000 mL | INTRAMUSCULAR | Status: AC | PRN
  Administered 2023-05-31: 5 mL

## 2023-05-31 MED ORDER — HYALURONAN 30 MG/2ML IX SOSY
30.0000 mg | PREFILLED_SYRINGE | INTRA_ARTICULAR | Status: AC | PRN
  Administered 2023-05-31: 30 mg via INTRA_ARTICULAR

## 2023-05-31 NOTE — Progress Notes (Signed)
   Procedure Note  Patient: Catherine Harrington             Date of Birth: 1946/04/16           MRN: [id number]             Visit Date: 05/30/2023  Procedures: Visit Diagnoses:  1. Arthritis of right knee     Large Joint Inj: R knee on 05/31/2023 9:37 PM Indications: diagnostic evaluation, joint swelling and pain Details: 18 G 1.5 in needle, superolateral approach  Arthrogram: No  Medications: 5 mL lidocaine 1 %; 30 mg Hyaluronan 30 MG/2ML Outcome: tolerated well, no immediate complications Procedure, treatment alternatives, risks and benefits explained, specific risks discussed. Consent was given by the patient. Immediately prior to procedure a time out was called to verify the correct patient, procedure, equipment, support staff and site/side marked as required. Patient was prepped and draped in the usual sterile fashion.

## 2023-05-31 NOTE — Telephone Encounter (Signed)
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  Please let them know also

## 2023-05-31 NOTE — Telephone Encounter (Signed)
Patients husband called in stating that her knee is swollen, she is unable to bend it. She had it aspirated/injected yesterday. She has been icing it, just started a heat press, and taking tylenol. Hoping for meloxicam to be called in if possible. Please call back

## 2023-06-01 MED ORDER — DIAZEPAM 5 MG PO TABS: 5.0000 mg | ORAL_TABLET | Freq: Two times a day (BID) | ORAL | 0 refills | Status: AC | PRN

## 2023-06-01 MED ORDER — MELOXICAM 7.5 MG PO TABS: 7.5000 mg | ORAL_TABLET | Freq: Every day | ORAL | 1 refills | Status: AC | PRN

## 2023-06-01 NOTE — Addendum Note (Signed)
Addended by: Rise Paganini on: 06/01/2023 07:54 AM   Modules accepted: Orders

## 2023-06-03 ENCOUNTER — Other Ambulatory Visit: Payer: Self-pay

## 2023-06-03 ENCOUNTER — Inpatient Hospital Stay: Payer: Medicare Other | Attending: Hematology

## 2023-06-03 ENCOUNTER — Encounter: Payer: Self-pay | Admitting: Hematology

## 2023-06-03 ENCOUNTER — Inpatient Hospital Stay (HOSPITAL_BASED_OUTPATIENT_CLINIC_OR_DEPARTMENT_OTHER): Payer: Medicare Other | Admitting: Hematology

## 2023-06-03 DIAGNOSIS — E039 Hypothyroidism, unspecified: Secondary | ICD-10-CM | POA: Insufficient documentation

## 2023-06-03 DIAGNOSIS — Z79890 Hormone replacement therapy (postmenopausal): Secondary | ICD-10-CM | POA: Insufficient documentation

## 2023-06-03 DIAGNOSIS — F17210 Nicotine dependence, cigarettes, uncomplicated: Secondary | ICD-10-CM | POA: Diagnosis not present

## 2023-06-03 DIAGNOSIS — D539 Nutritional anemia, unspecified: Secondary | ICD-10-CM | POA: Insufficient documentation

## 2023-06-03 LAB — CMP (CANCER CENTER ONLY)
ALT: 41 U/L (ref 0–44)
AST: 57 U/L — ABNORMAL HIGH (ref 15–41)
Albumin: 4.1 g/dL (ref 3.5–5.0)
Alkaline Phosphatase: 68 U/L (ref 38–126)
Anion gap: 8 (ref 5–15)
BUN: 15 mg/dL (ref 8–23)
CO2: 24 mmol/L (ref 22–32)
Calcium: 9.5 mg/dL (ref 8.9–10.3)
Chloride: 102 mmol/L (ref 98–111)
Creatinine: 1.14 mg/dL — ABNORMAL HIGH (ref 0.44–1.00)
GFR, Estimated: 50 mL/min — ABNORMAL LOW (ref >60)
Glucose, Bld: 90 mg/dL (ref 70–99)
Potassium: 4.2 mmol/L (ref 3.5–5.1)
Sodium: 134 mmol/L — ABNORMAL LOW (ref 135–145)
Total Bilirubin: 0.7 mg/dL (ref 0.3–1.2)
Total Protein: 7 g/dL (ref 6.5–8.1)

## 2023-06-03 LAB — FERRITIN: Ferritin: 231 ng/mL (ref 11–307)

## 2023-06-03 LAB — CBC WITH DIFFERENTIAL (CANCER CENTER ONLY)
Abs Immature Granulocytes: 0.01 10*3/uL (ref 0.00–0.07)
Basophils Absolute: 0 10*3/uL (ref 0.0–0.1)
Basophils Relative: 0 %
Eosinophils Absolute: 0 10*3/uL (ref 0.0–0.5)
Eosinophils Relative: 1 %
HCT: 31.2 % — ABNORMAL LOW (ref 36.0–46.0)
Hemoglobin: 11 g/dL — ABNORMAL LOW (ref 12.0–15.0)
Immature Granulocytes: 0 %
Lymphocytes Relative: 28 %
Lymphs Abs: 0.9 10*3/uL (ref 0.7–4.0)
MCH: 36.3 pg — ABNORMAL HIGH (ref 26.0–34.0)
MCHC: 35.3 g/dL (ref 30.0–36.0)
MCV: 103 fL — ABNORMAL HIGH (ref 80.0–100.0)
Monocytes Absolute: 0.3 10*3/uL (ref 0.1–1.0)
Monocytes Relative: 9 %
Neutro Abs: 2.1 10*3/uL (ref 1.7–7.7)
Neutrophils Relative %: 62 %
Platelet Count: 160 10*3/uL (ref 150–400)
RBC: 3.03 MIL/uL — ABNORMAL LOW (ref 3.87–5.11)
RDW: 13 % (ref 11.5–15.5)
WBC Count: 3.4 10*3/uL — ABNORMAL LOW (ref 4.0–10.5)
nRBC: 0 % (ref 0.0–0.2)

## 2023-06-03 LAB — IRON AND IRON BINDING CAPACITY (CC-WL,HP ONLY)
Iron: 126 ug/dL (ref 28–170)
Saturation Ratios: 45 % — ABNORMAL HIGH (ref 10.4–31.8)
TIBC: 280 ug/dL (ref 250–450)
UIBC: 154 ug/dL (ref 148–442)

## 2023-06-03 NOTE — Progress Notes (Signed)
[facility]   Telephone:([phone]) [phone] Fax:(336) [phone]   Clinic Follow up Note   Patient Care Team: Cleatis Polka., MD as PCP - General (Internal Medicine) Cleatis Polka., MD (Internal Medicine)  Date of Service:  06/03/2023  CHIEF COMPLAINT: f/u of hereditary hemochromatosis    CURRENT THERAPY:  Therapeutic Phlebotomies with 0.5 unit blood as needed to keep ferritin below 300   ASSESSMENT:  Catherine Harrington is a 77 y.o. female with    1. 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  She never had liver biopsy.  -US Abdomen 01/02/21 showed heterogeneous, subtly echogenic appearance of liver without focal liver lesion. -most recent MRI abdomen on 05/18/22 was negative. -she has been undergoing phlebotomy as needed to keep her ferritin under 300, last 01/2020. She does not like them, mainly due to pain (even with emla cream), and has declined unless absolutely necessary. -We discussed the option of oral iron chelating agents, potential side effects.  I encouraged her to continue phlebotomy as she can tolerate, but will consider iron chelating if she is not able to do more phlebotomy -she agrees to continue labs and phlebotomy as needed every 6-9 months.   2. Chronic macrocytic anemia -Macrocytosis may be secondary to previous chronic alcohol use and hypothyroidism. Normal B12 and folic acid.  Myelodysplastic syndrome not excluded but felt to be unlikely given stable blood counts  -Labs reviewed, overall stable   3. Comorbidities: Discoid Lupus, Arthritis with chronic back pain, Hypothyroid, HTN, Smoking Cessation -She continues to f/u with her Rheumatologist. -On baclofen and Gabapentin for pain, stable -On Levothyroxine and losartan -She smokes about  1 ppd. Cessation has been previously discussed and advised.  -She follows Dr. Alvester Morin for her arthritis and receives injections as needed. -Most recent lung cancer screening 05/05/22 was benign   4. Possible abdominal Hernia  -Surgery was not recommended by general surgeon.        PLAN: -Iron  level-pending, will call her next week, will proceed with half unit phlebotomy if ferritin more than 300. -I order US abdomin I weeks for liver cancer screening  -f/u phlebotomy and lab in 8 months     INTERVAL HISTORY:  Catherine Harrington is here for a follow up of hereditary hemochromatosis. She was last seen by me on 06/03/2022. She presents to the clinic alone. Pt state that she had Gel injected in her knee for arthritis.     All other systems were reviewed with the patient and are negative.  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   Hypertension    Iron deficiency anemia    Lumbar disc disease    Lupus (HCC)    "just the rash and arthritis" (08/16/2013)   Macrocytic anemia 06/29/2016   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/18/2016   Scoliosis    Syncope and collapse    "lost consciousness ~ 4-5 seconds; didn't hurt myself" (08/16/2013)  SURGICAL HISTORY: Past Surgical History:  Procedure Laterality Date   CATARACT EXTRACTION Bilateral 10/19 11/19   CESAREAN SECTION  1967; 1971   MOUTH SURGERY  2013   " infection under bridge cleaned out, etc" (08/16/2013)   TUBAL LIGATION      I have reviewed the social history and family history with the patient and they are unchanged from  previous note.  ALLERGIES:  is allergic to prednisone, sulfonamide derivatives, epinephrine, and midol [ibuprofen].  MEDICATIONS:  Current Outpatient Medications  Medication Sig Dispense Refill   meloxicam (MOBIC) 7.5 MG tablet Take 1 tablet (7.5 mg total) by mouth daily as needed for pain. 30 tablet 0   acetaminophen-codeine (TYLENOL #3) 300-30 MG tablet Take 1 tablet by mouth every 8 (eight) hours as needed for moderate pain. 15 tablet 0   baclofen (LIORESAL) 10 MG tablet Take 1/2 to 1 by mouth every 8hrs as needed for spasm 60 tablet 0   baclofen (LIORESAL) 10 MG tablet 1/2 tablet po q 8 hours prn 30 each 0   calcium carbonate (OS-CAL) 600 MG TABS Take 600 mg by mouth 2 (two) times daily with a meal.     CREON 24000-76000 units CPEP      dexamethasone 0.5 MG/5ML elixir SWISH 5 MLS IN THE MOUTH FOR 3 MINUTES THEN SPIT THREE TIMES DAILY FOR 3 WEEKS     diazepam (VALIUM) 5 MG tablet Take one tablet by mouth with food one hour prior to procedure. May repeat 30 minutes prior if needed. 2 tablet 0   diazepam (VALIUM) 5 MG tablet Take 1 tablet (5 mg total) by mouth every 12 (twelve) hours as needed for anxiety. 4 tablet 0   doxazosin (CARDURA) 8 MG tablet Take 8 mg by mouth at bedtime.     doxycycline (VIBRAMYCIN) 100 MG capsule Take 1 capsule (100 mg total) by mouth 2 (two) times daily. 20 capsule 0   fexofenadine (ALLEGRA) 180 MG tablet Take 180 mg by mouth daily.     gabapentin (NEURONTIN) 300 MG capsule TAKE 1 CAPSULE(300 MG) BY MOUTH AT BEDTIME 90 capsule 3   hydrOXYzine (ATARAX/VISTARIL) 10 MG tablet Take by mouth.     levothyroxine (SYNTHROID, LEVOTHROID) 50 MCG tablet Take 50 mcg by mouth daily before breakfast.     lidocaine (XYLOCAINE) 2 % solution SMARTSIG:3-5 Milliliter(s) By Mouth Every 4-6 Hours PRN     lidocaine-prilocaine (EMLA) cream Apply 1 application topically as needed. 30 g 0   LORazepam (ATIVAN) 0.5 MG tablet Take 1 tablet prior to phlebotomy 2 tablet 0   losartan (COZAAR)  100 MG tablet Take 100 mg by mouth daily.     meloxicam (MOBIC) 7.5 MG tablet Take 1 tablet (7.5 mg total) by mouth daily as needed for pain. 30 tablet 1   milk thistle 175 MG tablet Take 250 mg by mouth daily.     Multiple Vitamin (MULTIVITAMIN) tablet Take 1 tablet by mouth daily.     Potassium Gluconate 595 MG CAPS Take by mouth.     rosuvastatin (CRESTOR) 10 MG tablet Take 10 mg by mouth at bedtime.     vitamin B-12 (CYANOCOBALAMIN) 1000 MCG tablet Take 1,000 mcg by mouth daily.     Current Facility-Administered Medications  Medication Dose Route Frequency Provider Last Rate Last Admin   sodium chloride 0.9 % injection 250 mL  250 mL Intravenous Once Burns Spain, MD        PHYSICAL EXAMINATION: ECOG PERFORMANCE STATUS: 1 - Symptomatic but completely  ambulatory  Vitals:   06/03/23 1440  BP: (!) 147/67  Pulse: 73  Resp: 18  Temp: 98.7 F (37.1 C)  SpO2: 97%   Wt Readings from Last 3 Encounters:  06/03/23 93 lb 9.6 oz (42.5 kg)  06/03/22 94 lb (42.6 kg)  05/29/21 91 lb 8 oz (41.5 kg)     GENERAL:alert, no distress and comfortable SKIN: skin color normal, no rashes or significant lesions EYES: normal, Conjunctiva are pink and non-injected, sclera clear  NEURO: alert & oriented x 3 with fluent speech LABORATORY DATA:  I have reviewed the data as listed    Latest Ref Rng & Units 06/03/2023    2:23 PM 06/03/2022    2:24 PM 05/29/2021    1:53 PM  CBC  WBC 4.0 - 10.5 K/uL 3.4  4.1  4.1   Hemoglobin 12.0 - 15.0 g/dL 16.1  09.6  04.5   Hematocrit 36.0 - 46.0 % 31.2  31.1  31.1   Platelets 150 - 400 K/uL 160  150  170         Latest Ref Rng & Units 06/03/2023    2:23 PM 06/03/2022    2:24 PM 05/29/2021    1:53 PM  CMP  Glucose 70 - 99 mg/dL 90  81  93   BUN 8 - 23 mg/dL 15  16  14    Creatinine 0.44 - 1.00 mg/dL 4.09  8.11  9.14   Sodium 135 - 145 mmol/L 134  136  132   Potassium 3.5 - 5.1 mmol/L 4.2  4.2  4.4   Chloride 98 - 111 mmol/L 102  107  101   CO2 22 - 32  mmol/L 24  23  19    Calcium 8.9 - 10.3 mg/dL 9.5  9.3  9.1   Total Protein 6.5 - 8.1 g/dL 7.0  7.7  7.7   Total Bilirubin 0.3 - 1.2 mg/dL 0.7  0.5  0.6   Alkaline Phos 38 - 126 U/L 68  60  95   AST 15 - 41 U/L 57  63  154   ALT 0 - 44 U/L 41  41  88       RADIOGRAPHIC STUDIES: I have personally reviewed the radiological images as listed and agreed with the findings in the report. No results found.    No orders of the defined types were placed in this encounter.  All questions were answered. The patient knows to call the clinic with any problems, questions or concerns. No barriers to learning was detected. The total time spent in the appointment was 20 minutes.     Malachy Mood, MD 06/03/2023   Carolin Coy, CMA, am acting as scribe for Malachy Mood, MD.   I have reviewed the above documentation for accuracy and completeness, and I agree with the above.

## 2023-06-03 NOTE — Telephone Encounter (Signed)
I called and sw pt to advise that Mobic had been sent to pharm and pt states thse is aware and going to follow up with her PCP to make sure she should be taking. Will call with any other questions.

## 2023-06-06 ENCOUNTER — Ambulatory Visit (INDEPENDENT_AMBULATORY_CARE_PROVIDER_SITE_OTHER): Payer: Medicare Other | Admitting: Orthopedic Surgery

## 2023-06-06 ENCOUNTER — Telehealth: Payer: Self-pay

## 2023-06-06 DIAGNOSIS — M1711 Unilateral primary osteoarthritis, right knee: Secondary | ICD-10-CM | POA: Diagnosis not present

## 2023-06-06 NOTE — Telephone Encounter (Addendum)
Called patient to relay message below as per Dr. Mosetta Putt. Patient voiced full understanding.   ----- Message from Malachy Mood, MD sent at 06/05/2023 12:20 PM EDT ----- Please let pt know her iron level is within goal, so no phlebotomy this time, thanks   Malachy Mood

## 2023-06-07 ENCOUNTER — Encounter: Payer: Self-pay | Admitting: Orthopedic Surgery

## 2023-06-07 MED ORDER — LIDOCAINE HCL 1 % IJ SOLN
5.0000 mL | INTRAMUSCULAR | Status: AC | PRN
  Administered 2023-06-06: 5 mL

## 2023-06-07 MED ORDER — BUPIVACAINE HCL 0.25 % IJ SOLN
4.0000 mL | INTRAMUSCULAR | Status: AC | PRN
  Administered 2023-06-06: 4 mL via INTRA_ARTICULAR

## 2023-06-07 NOTE — Progress Notes (Signed)
   Procedure Note  Patient: Catherine Harrington             Date of Birth: 03/26/1946           MRN: [id number]             Visit Date: 06/06/2023  Procedures: Visit Diagnoses: No diagnosis found.  Large Joint Inj: R knee on 06/06/2023 9:33 PM Indications: diagnostic evaluation, joint swelling and pain Details: 18 G 1.5 in needle, superolateral approach  Arthrogram: No  Medications: 5 mL lidocaine 1 %; 4 mL bupivacaine 0.25 % Outcome: tolerated well, no immediate complications Procedure, treatment alternatives, risks and benefits explained, specific risks discussed. Consent was given by the patient. Immediately prior to procedure a time out was called to verify the correct patient, procedure, equipment, support staff and site/side marked as required. Patient was prepped and draped in the usual sterile fashion.     Toradol injected.  20 cc serous fluid aspirated.  Patient may have had a reaction to the first Orthovisc injection.  Was feeling much better after the first 48 hours.  We will reassess in 2 weeks for possible continuation of Orthovisc injections

## 2023-06-10 ENCOUNTER — Ambulatory Visit (HOSPITAL_COMMUNITY)
Admission: RE | Admit: 2023-06-10 | Discharge: 2023-06-10 | Disposition: A | Discharge status: Home / Self Care | Payer: Medicare Other | Source: Ambulatory Visit | Attending: Hematology | Admitting: Hematology

## 2023-06-14 ENCOUNTER — Telehealth: Payer: Self-pay

## 2023-06-14 NOTE — Telephone Encounter (Addendum)
Called patient and relayed message below as per Dr. Mosetta Putt. Patient voiced full understanding.    ----- Message from Malachy Mood sent at 06/14/2023  9:48 AM EDT ----- Please let pt know her [place] result, she has fatty liver, but no cirrhosis or cancer, thanks   Malachy Mood

## 2023-06-22 ENCOUNTER — Ambulatory Visit (INDEPENDENT_AMBULATORY_CARE_PROVIDER_SITE_OTHER): Payer: Medicare Other | Admitting: Surgical

## 2023-06-22 DIAGNOSIS — M1711 Unilateral primary osteoarthritis, right knee: Secondary | ICD-10-CM

## 2023-06-24 ENCOUNTER — Encounter: Payer: Self-pay | Admitting: Surgical

## 2023-06-24 NOTE — Progress Notes (Signed)
Follow-up Office Visit Note   Patient: Catherine Harrington           Date of Birth: [birth year]           MRN: [id number] Visit Date: 06/22/2023 Requested by: Cleatis Polka., MD [address] [place],  [place] [address] PCP: Cleatis Polka., MD  Subjective: Chief Complaint  Patient presents with   Right Knee - Follow-up    HPI: Catherine Harrington is a 77 y.o. female who returns to the office for follow-up visit.    Plan at last visit was: Toradol injected. 20 cc serous fluid aspirated. Patient may have had a reaction to the first Orthovisc injection. Was feeling much better after the first 48 hours. We will reassess in 2 weeks for possible continuation of Orthovisc injections   Since then, patient notes she is doing very well.  Really has no complaints regarding her knee and she feels that she is doing well enough that she would like to forego any injections at this time.  No recent fall or injury.  No groin pain.  Has had some return of swelling.              ROS: All systems reviewed are negative as they relate to the chief complaint within the history of present illness.  Patient denies fevers or chills.  Assessment & Plan: Visit Diagnoses:  1. Arthritis of right knee     Plan: Catherine Harrington is a 77 y.o. female who returns to the office for follow-up visit for right knee pain.  Plan from last visit was noted above in HPI.  They now return with near full resolution of knee pain from Toradol injection administered 2 weeks ago by Dr. August Saucer.  Has done well enough that she would like to forego any further injections of gel or cortisone.  Did have a 48-hour increase in pain after the gel injection but this has not returned.  She will follow-up with the office as needed.  Follow-Up Instructions: Return if symptoms worsen or fail to improve.   Orders:  No orders of the defined types were placed in this encounter.  No orders of the defined types were placed in this  encounter.     Procedures: No procedures performed   Clinical Data: No additional findings.  Objective: Vital Signs: There were no vitals taken for this visit.  Physical Exam:  Constitutional: Patient appears well-developed HEENT:  Head: Normocephalic Eyes:EOM are normal Neck: Normal range of motion Cardiovascular: Normal rate Pulmonary/chest: Effort normal Neurologic: Patient is alert Skin: Skin is warm Psychiatric: Patient has normal mood and affect  Ortho Exam: Ortho exam demonstrates right knee with positive effusion that is fairly equivalent to the last exam.  She has no loss of range of motion from last exam.  Able to perform straight leg raise.  No pain with hip range of motion.  No cellulitis or skin changes noted around the knee.  No calf tenderness.  Negative Homans' sign.  Specialty Comments:  EXAM: MRI LUMBAR SPINE WITHOUT CONTRAST   TECHNIQUE: Multiplanar, multisequence MR imaging of the lumbar spine was performed. No intravenous contrast was administered.   COMPARISON:  05/09/2017   FINDINGS: Segmentation: 5 lumbar type vertebrae based on the available coverage   Alignment: Marked dextroscoliosis with rightward translation at L4-5. The curvature is centered at the level of L3.   Vertebrae: Degenerative fatty marrow conversion at L2-L5 primarily. No fracture, discitis, or aggressive bone lesion  Conus medullaris and cauda equina: Conus extends to the T12 level. Conus and cauda equina appear normal.   Paraspinal and other soft tissues: Marked muscular atrophy involving the intrinsic back muscles and bilateral psoas. No incidental inflammation or mass is seen about the spine.   Disc levels:   T12- L1: Unremarkable.   L1-L2: Disc collapse and endplate degeneration with ridging. No compressive spinal or foraminal stenosis   L2-L3: Asymmetric leftward disc collapse. Degenerative facet spurring asymmetric to the left. The canal and foramina are  patent. The left subarticular recess is effaced.   L3-L4: Disc collapse asymmetric to the left. Intervertebral ankylosis has occurred remotely. Facet spurring asymmetric to the left where there is moderate foraminal narrowing. Left subarticular recess narrowing that could affect the L4 nerve root.   L4-L5: Disc collapse and endplate degeneration. Degenerative facet spurring asymmetric to the right. Advanced right foraminal stenosis. Right subarticular recess narrowing that could certainly affect the L5 nerve root. More moderate left subarticular recess narrowing.   L5-S1:Degenerative facet spurring with small joint effusions. Preserved disc height and hydration compared to the other levels. No neural compression.   IMPRESSION: 1. No acute or interval finding when compared to 2018. 2. Advanced lumbar spine degeneration and dextroscoliosis. 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 3. L4-5 right foraminal and subarticular recess impingement. 4. On the left there is subarticular recess stenosis at L2-3 to L4-5. 5. Advanced and generalized muscular atrophy.     Electronically Signed   By: Marnee Spring M.D.   On: 09/12/2020 18:55  Imaging: No results found.   PMFS History: 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   Macrocytosis 08/16/2013   Syncope 08/16/2013   DIVERTICULOSIS, COLON  11/04/2010   Past Medical History:  Diagnosis Date   Acute right flank pain 06/29/2016   Arthritis    "related to the lupus; joints" (08/16/2013)   Chronic renal insufficiency, stage III (moderate) (HCC) 06/29/2016   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   Family history of anesthesia complication    "daughter doesn't wake up easy from it" (08/16/2013)   Fibrosis of liver 04/18/2018   Hemochromatosis 05/18/2016   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   Hypertension    Iron deficiency anemia    Lumbar disc disease    Lupus (HCC)    "just the rash and arthritis" (08/16/2013)   Macrocytic anemia 06/29/2016   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/18/2016   Scoliosis    Syncope and collapse    "lost consciousness ~ 4-5 seconds; didn't hurt myself" (08/16/2013)    Family History  Problem Relation Age of Onset   Hypertension Mother    Hypertension Father     Past Surgical History:  Procedure Laterality Date   CATARACT EXTRACTION Bilateral 10/19 11/19   CESAREAN SECTION  1967; 1971   MOUTH SURGERY  2013   " infection under bridge cleaned out, etc" (08/16/2013)   TUBAL LIGATION     Social History   Occupational History  Occupation: retired  Tobacco Use   Smoking status: Every Day    Current packs/day: 1.00    Average packs/day: 1 pack/day for 48.0 years (48.0 ttl pk-yrs)    Types: Cigarettes   Smokeless tobacco: Never  Substance and Sexual Activity   Alcohol use: Yes    Alcohol/week: 21.0 standard drinks of alcohol    Types: 21 Cans of beer per week    Comment:  3-4 drinks daily sometimes.   Drug use: No   Sexual activity: Not Currently    Partners: Male    Birth control/protection: Post-menopausal

## 2023-06-27 ENCOUNTER — Telehealth: Payer: Self-pay

## 2023-06-27 DIAGNOSIS — M542 Cervicalgia: Secondary | ICD-10-CM

## 2023-06-27 NOTE — Telephone Encounter (Signed)
Patient called requesting a referral to [facility] for dry needling. Please advise

## 2023-06-29 NOTE — Telephone Encounter (Signed)
PT referral sent to Dr. Darcella Gasman

## 2023-06-29 NOTE — Addendum Note (Signed)
Addended by: Sharlet Salina on: 06/29/2023 04:39 PM   Modules accepted: Orders

## 2023-07-26 ENCOUNTER — Ambulatory Visit (INDEPENDENT_AMBULATORY_CARE_PROVIDER_SITE_OTHER): Payer: Medicare Other | Admitting: Podiatry

## 2023-07-26 ENCOUNTER — Encounter: Payer: Self-pay | Admitting: Podiatry

## 2023-07-26 DIAGNOSIS — M25871 Other specified joint disorders, right ankle and foot: Secondary | ICD-10-CM | POA: Insufficient documentation

## 2023-07-26 NOTE — Progress Notes (Signed)
This patient returns to my office for at risk foot care.  This patient requires this care by a professional since this patient will be at risk due to having chronic kidney disease stage  .  Patient says the area under her big toe joint is very painful.walking and wearing her shoes.  This patient is uable to trim her callus since she cannot reach her feet due to back problems.  She says the area under her big toe joint right foot throbs at night. This patient presents for at risk foot care today.  General Appearance  Alert, conversant and in no acute stress.  Vascular  Dorsalis pedis and posterior tibial  pulses are palpable  bilaterally.  Capillary return is within normal limits  bilaterally. Temperature is within normal limits  bilaterally.  Neurologic  Senn-Weinstein monofilament wire test within normal limits  bilaterally. Muscle power within normal limits bilaterally.  Nails Thick disfigured discolored nails with subungual debris  from hallux to fifth toes bilaterally. No evidence of bacterial infection or drainage bilaterally.  Orthopedic  No limitations of motion  feet .  No crepitus or effusions noted.  No bony pathology or digital deformities noted.  Enlarged and painful tibial sesamoid right foot.  Skin  normotropic skin   noted bilaterally.  No signs of infections or ulcers noted.   Porokeratosis sub 1 right foot.  Callus left hallux.  Sesamoiditis sub 1st MPJ right foot.  Consent was obtained for treatment procedures.   Discussed formation of callus due to the ribial sesamiod.   Nails done as a courtesy.   Return office visit    9   weeks                   Told patient to return for periodic foot care and evaluation due to potential at risk complications.   Helane Gunther DPM

## 2023-08-08 ENCOUNTER — Encounter: Payer: Self-pay | Admitting: Surgical

## 2023-08-08 ENCOUNTER — Ambulatory Visit (INDEPENDENT_AMBULATORY_CARE_PROVIDER_SITE_OTHER): Payer: Medicare Other | Admitting: Surgical

## 2023-08-08 DIAGNOSIS — M1711 Unilateral primary osteoarthritis, right knee: Secondary | ICD-10-CM

## 2023-08-08 NOTE — Progress Notes (Signed)
Office Visit Note   Patient: Catherine Harrington           Date of Birth: Nov 24, 1946           MRN: [id number] Visit Date: 08/08/2023 Requested by: Cleatis Polka., MD [address] [place],  [place] [address] PCP: Cleatis Polka., MD  Subjective: Chief Complaint  Patient presents with   Right Knee - Pain    HPI: Catherine Harrington is a 77 y.o. female who presents to the office reporting right knee pain.  Patient states that she has intermittent right knee pain.  Was last seen about 6 weeks ago after gel injection and subsequent Toradol injection that gave her good relief.  She does complain that her knee "feels like it is floating".  Had increased pain recently that was actually calm down after she saw her podiatrist and he debrided a callus on the medial aspect of her right foot.  Has occasional buttock pain that she receives treatment by [facility] at [facility] physical therapy.  Since she has made her appointment here, her right knee pain is actually calm down to the point that is not really symptomatic for her.  No new pain.  No locking symptoms.  No mechanical symptoms..                ROS: All systems reviewed are negative as they relate to the chief complaint within the history of present illness.  Patient denies fevers or chills.  Assessment & Plan: Visit Diagnoses:  1. Arthritis of right knee     Plan: Plan is to hold off on any intervention for her right knee today per patient's request.  Take occasional meloxicam only on the days that her knee pain flares up.  Could consider right knee injection with aspiration in the future if she has another flareup of her pain that is not improved with meloxicam.  Follow-up with the office as needed.  Follow-Up Instructions: No follow-ups on file.   Orders:  No orders of the defined types were placed in this encounter.  No orders of the defined types were placed in this encounter.     Procedures: No procedures performed   Clinical  Data: No additional findings.  Objective: Vital Signs: There were no vitals taken for this visit.  Physical Exam:  Constitutional: Patient appears well-developed HEENT:  Head: Normocephalic Eyes:EOM are normal Neck: Normal range of motion Cardiovascular: Normal rate Pulmonary/chest: Effort normal Neurologic: Patient is alert Skin: Skin is warm Psychiatric: Patient has normal mood and affect  Ortho Exam: Ortho exam demonstrates right knee with small effusion.  No pain with hip range of motion.  Able to perform straight leg raise without extensor lag.  Excellent quad strength.  No cellulitis or skin changes noted overlying the right knee.  No calf tenderness.  Negative Homans' sign.  Does have moderate tenderness over the first MTP joint of the right foot.  No significant tenderness over the medial or lateral joint lines of the right knee.  Ambulates without significant antalgia.  Specialty Comments:  EXAM: MRI LUMBAR SPINE WITHOUT CONTRAST   TECHNIQUE: Multiplanar, multisequence MR imaging of the lumbar spine was performed. No intravenous contrast was administered.   COMPARISON:  05/09/2017   FINDINGS: Segmentation: 5 lumbar type vertebrae based on the available coverage   Alignment: Marked dextroscoliosis with rightward translation at L4-5. The curvature is centered at the level of L3.   Vertebrae: Degenerative fatty marrow conversion at L2-L5 primarily.  No fracture, discitis, or aggressive bone lesion   Conus medullaris and cauda equina: Conus extends to the T12 level. Conus and cauda equina appear normal.   Paraspinal and other soft tissues: Marked muscular atrophy involving the intrinsic back muscles and bilateral psoas. No incidental inflammation or mass is seen about the spine.   Disc levels:   T12- L1: Unremarkable.   L1-L2: Disc collapse and endplate degeneration with ridging. No compressive spinal or foraminal stenosis   L2-L3: Asymmetric leftward disc  collapse. Degenerative facet spurring asymmetric to the left. The canal and foramina are patent. The left subarticular recess is effaced.   L3-L4: Disc collapse asymmetric to the left. Intervertebral ankylosis has occurred remotely. Facet spurring asymmetric to the left where there is moderate foraminal narrowing. Left subarticular recess narrowing that could affect the L4 nerve root.   L4-L5: Disc collapse and endplate degeneration. Degenerative facet spurring asymmetric to the right. Advanced right foraminal stenosis. Right subarticular recess narrowing that could certainly affect the L5 nerve root. More moderate left subarticular recess narrowing.   L5-S1:Degenerative facet spurring with small joint effusions. Preserved disc height and hydration compared to the other levels. No neural compression.   IMPRESSION: 1. No acute or interval finding when compared to 2018. 2. Advanced lumbar spine degeneration and dextroscoliosis. 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 3. L4-5 right foraminal and subarticular recess impingement. 4. On the left there is subarticular recess stenosis at L2-3 to L4-5. 5. Advanced and generalized muscular atrophy.     Electronically Signed   By: Marnee Spring M.D.   On: 09/12/2020 18:55  Imaging: No results found.   PMFS History: Patient Active Problem List   Diagnosis Date Noted   Sesamoiditis of right foot 07/26/2023   Pain due to onychomycosis of toenails of both feet 08/27/2020   Myofascial pain syndrome 03/20/2020   Scoliosis of thoracolumbar spine 02/18/2020   Spinal stenosis of lumbar region without neurogenic claudication 02/18/2020   Porokeratosis 01/16/2020   Fibrosis of liver 04/18/2018   Macrocytic anemia 06/29/2016   Chronic renal insufficiency, stage III (moderate) (HCC) 06/29/2016   Acute right flank pain 06/29/2016   Chronic renal insufficiency 06/29/2016   Hemochromatosis 05/18/2016   Osteoarthritis of lumbosacral  spine 05/18/2016   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   Macrocytosis 08/16/2013   Syncope 08/16/2013   DIVERTICULOSIS, COLON 11/04/2010   Past Medical History:  Diagnosis Date   Acute right flank pain 06/29/2016   Arthritis    "related to the lupus; joints" (08/16/2013)   Chronic renal insufficiency, stage III (moderate) (HCC) 06/29/2016   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   Family history of anesthesia complication    "daughter doesn't wake up easy from it" (08/16/2013)   Fibrosis of liver 04/18/2018   Hemochromatosis 05/18/2016   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   Hypertension    Iron deficiency anemia    Lumbar disc disease    Lupus (HCC)    "just the rash and arthritis" (08/16/2013)   Macrocytic anemia 06/29/2016   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/18/2016   Scoliosis    Syncope and collapse    "lost consciousness ~ 4-5 seconds; didn't hurt myself" (08/16/2013)    Family History  Problem Relation Age of Onset   Hypertension Mother    Hypertension Father     Past Surgical History:  Procedure Laterality Date   CATARACT EXTRACTION Bilateral 10/19 11/19   CESAREAN SECTION  1967; 1971   MOUTH SURGERY  2013   " infection under bridge cleaned out,  etc" (08/16/2013)   TUBAL LIGATION     Social History   Occupational History   Occupation: retired  Tobacco Use   Smoking status: Every Day    Current packs/day: 1.00    Average packs/day: 1 pack/day for 48.0 years (48.0 ttl pk-yrs)    Types: Cigarettes   Smokeless tobacco: Never  Substance and Sexual Activity   Alcohol use: Yes    Alcohol/week: 21.0 standard drinks of alcohol    Types: 21 Cans of beer per week    Comment:  3-4 drinks daily sometimes.   Drug use: No   Sexual activity: Not Currently    Partners: Male    Birth control/protection: Post-menopausal

## 2023-10-05 ENCOUNTER — Ambulatory Visit (INDEPENDENT_AMBULATORY_CARE_PROVIDER_SITE_OTHER): Payer: Medicare Other | Admitting: Otolaryngology

## 2023-10-05 ENCOUNTER — Encounter (INDEPENDENT_AMBULATORY_CARE_PROVIDER_SITE_OTHER): Payer: Self-pay

## 2023-10-05 VITALS — Ht 61.0 in | Wt 92.0 lb

## 2023-10-05 DIAGNOSIS — H903 Sensorineural hearing loss, bilateral: Secondary | ICD-10-CM

## 2023-10-05 DIAGNOSIS — R0981 Nasal congestion: Secondary | ICD-10-CM

## 2023-10-05 DIAGNOSIS — J310 Chronic rhinitis: Secondary | ICD-10-CM

## 2023-10-05 DIAGNOSIS — J320 Chronic maxillary sinusitis: Secondary | ICD-10-CM | POA: Insufficient documentation

## 2023-10-05 DIAGNOSIS — J343 Hypertrophy of nasal turbinates: Secondary | ICD-10-CM | POA: Diagnosis not present

## 2023-10-05 DIAGNOSIS — H9311 Tinnitus, right ear: Secondary | ICD-10-CM | POA: Insufficient documentation

## 2023-10-05 NOTE — Progress Notes (Signed)
Patient ID: Catherine Harrington, female   DOB: 04-18-1946, 77 y.o.   MRN: [id number]  Follow-up: Right ear tinnitus, bilateral hearing loss New complaint: Recurrent sinusitis, left facial swelling  HPI: The patient is a 77 year old female who returns today for her follow-up evaluation.  The patient was previously seen for intermittent right ear tinnitus.  She was also noted to have bilateral high-frequency sensorineural hearing loss.  The strategies to cope with tinnitus were discussed.  The patient returns today reporting resolution of her tinnitus.  However, she has a new complaint of recurrent sinusitis.  She complains of frequent left facial swelling, nasal congestion, and difficulty breathing through her nose.  She was recently evaluated by her dentist.  No dental abnormality was noted.  Currently she denies any fever or visual change.  She also denies any recent change in her hearing.  Exam: General: Communicates without difficulty, well nourished, no acute distress. Head: Normocephalic, no evidence injury, no tenderness, facial buttresses intact without stepoff. Face/sinus: No tenderness to palpation and percussion. Facial movement is normal and symmetric. Eyes: PERRL, EOMI. No scleral icterus, conjunctivae clear. Neuro: CN II exam reveals vision grossly intact.  No nystagmus at any point of gaze. Ears: Auricles well formed without lesions.  Ear canals are intact without mass or lesion.  No erythema or edema is appreciated.  The TMs are intact without fluid. Nose: External evaluation reveals normal support and skin without lesions.  Dorsum is intact.  Anterior rhinoscopy reveals congested mucosa over anterior aspect of inferior turbinates and intact septum.  No purulence noted. Oral:  Oral cavity and oropharynx are intact, symmetric, without erythema or edema.  Mucosa is moist without lesions. Neck: Full range of motion without pain.  There is no significant lymphadenopathy.  No masses palpable.  Thyroid  bed within normal limits to palpation.  Parotid glands and submandibular glands equal bilaterally without mass.  Trachea is midline. Neuro:  CN 2-12 grossly intact.   Procedure:  Flexible Nasal Endoscopy: Description: Risks, benefits, and alternatives of flexible endoscopy were explained to the patient.  Specific mention was made of the risk of throat numbness with difficulty swallowing, possible bleeding from the nose and mouth, and pain from the procedure.  The patient gave oral consent to proceed.  The flexible scope was inserted into the right nasal cavity.  Endoscopy of the interior nasal cavity, superior, inferior, and middle meatus was performed. The sphenoid-ethmoid recess was examined. Edematous mucosa was noted.  No polyp, mass, or lesion was appreciated. Olfactory cleft was clear.  Nasopharynx was clear.  Turbinates were hypertrophied but without mass.  The procedure was repeated on the contralateral side with similar findings.  The patient tolerated the procedure well.    Assessment: 1.  Chronic rhinitis with nasal mucosal congestion and bilateral inferior turbinate hypertrophy. 2.  History of recurrent sinusitis and facial swelling.  However, no purulent drainage or significant erythema is noted today. 3.  The patient's right ear tinnitus has resolved. 4.  Subjectively stable bilateral high-frequency sensorineural hearing loss.  Plan: 1.  The physical exam and nasal endoscopy findings are reviewed with the patient. 2.  Flonase nasal spray 2 sprays each nostril daily. 3.  The patient is reassured that no purulent drainage is noted today. 4.  Sinus CT scan to evaluate for possible chronic sinusitis. 5.  The patient will return for reevaluation in 3 to 4 weeks.

## 2023-10-19 ENCOUNTER — Encounter: Payer: Self-pay | Admitting: Hematology

## 2023-10-19 NOTE — Telephone Encounter (Signed)
Telephone call  

## 2023-10-20 ENCOUNTER — Ambulatory Visit (HOSPITAL_COMMUNITY): Payer: Medicare Other

## 2023-10-22 ENCOUNTER — Ambulatory Visit (HOSPITAL_COMMUNITY)
Admission: RE | Admit: 2023-10-22 | Discharge: 2023-10-22 | Discharge status: Home / Self Care | Payer: Medicare Other | Source: Ambulatory Visit | Attending: Otolaryngology | Admitting: Otolaryngology

## 2023-10-22 DIAGNOSIS — J320 Chronic maxillary sinusitis: Secondary | ICD-10-CM | POA: Insufficient documentation

## 2023-10-22 DIAGNOSIS — J343 Hypertrophy of nasal turbinates: Secondary | ICD-10-CM | POA: Diagnosis present

## 2023-10-25 ENCOUNTER — Encounter: Payer: Self-pay | Admitting: Podiatry

## 2023-10-25 ENCOUNTER — Ambulatory Visit (INDEPENDENT_AMBULATORY_CARE_PROVIDER_SITE_OTHER): Payer: Medicare Other | Admitting: Podiatry

## 2023-10-25 VITALS — Ht 61.0 in | Wt 92.0 lb

## 2023-10-25 DIAGNOSIS — Q828 Other specified congenital malformations of skin: Secondary | ICD-10-CM | POA: Diagnosis not present

## 2023-10-25 DIAGNOSIS — M25871 Other specified joint disorders, right ankle and foot: Secondary | ICD-10-CM

## 2023-10-25 DIAGNOSIS — M216X1 Other acquired deformities of right foot: Secondary | ICD-10-CM

## 2023-10-25 NOTE — Progress Notes (Signed)
This patient returns to my office for at risk foot care.  This patient requires this care by a professional since this patient will be at risk due to having chronic kidney disease stage  .  Patient says the area under her big toe joint is very painful.walking and wearing her shoes.  This patient is uable to trim her callus since she cannot reach her feet due to back problems.  She says the area under her big toe joint right foot throbs at night. This patient presents for at risk foot care today.  General Appearance  Alert, conversant and in no acute stress.  Vascular  Dorsalis pedis and posterior tibial  pulses are palpable  bilaterally.  Capillary return is within normal limits  bilaterally. Temperature is within normal limits  bilaterally.  Neurologic  Senn-Weinstein monofilament wire test within normal limits  bilaterally. Muscle power within normal limits bilaterally.  Nails Thick disfigured discolored nails with subungual debris  from hallux to fifth toes bilaterally. No evidence of bacterial infection or drainage bilaterally.  Orthopedic  No limitations of motion  feet .  No crepitus or effusions noted.  No bony pathology or digital deformities noted.  Enlarged and painful tibial sesamoid right foot.  Skin  normotropic skin   noted bilaterally.  No signs of infections or ulcers noted.   Porokeratosis sub 1 right foot.  Callus left hallux.  Sesamoiditis sub 1st MPJ right foot.  Consent was obtained for treatment procedures.   Discussed formation of callus due to the ribial sesamiod.   Debrided callus on both forefeet.  Nails done as a courtesy.   Return office visit    9   weeks                   Told patient to return for periodic foot care and evaluation due to potential at risk complications.   Helane Gunther DPM

## 2023-11-02 ENCOUNTER — Encounter (INDEPENDENT_AMBULATORY_CARE_PROVIDER_SITE_OTHER): Payer: Self-pay

## 2023-11-02 ENCOUNTER — Ambulatory Visit (INDEPENDENT_AMBULATORY_CARE_PROVIDER_SITE_OTHER): Payer: Medicare Other | Admitting: Otolaryngology

## 2023-11-02 VITALS — Ht 61.0 in | Wt 93.0 lb

## 2023-11-02 DIAGNOSIS — R0981 Nasal congestion: Secondary | ICD-10-CM

## 2023-11-02 DIAGNOSIS — J310 Chronic rhinitis: Secondary | ICD-10-CM | POA: Diagnosis not present

## 2023-11-02 DIAGNOSIS — J343 Hypertrophy of nasal turbinates: Secondary | ICD-10-CM | POA: Diagnosis not present

## 2023-11-02 DIAGNOSIS — R220 Localized swelling, mass and lump, head: Secondary | ICD-10-CM | POA: Diagnosis not present

## 2023-11-03 DIAGNOSIS — J310 Chronic rhinitis: Secondary | ICD-10-CM | POA: Insufficient documentation

## 2023-11-03 NOTE — Progress Notes (Signed)
Patient ID: Catherine Harrington, female   DOB: 11/26/1946, 77 y.o.   MRN: [id number]  Follow-up: Recurrent sinusitis, left facial swelling, chronic nasal congestion  HPI: The patient is a 77 year old female who returns today for her follow-up evaluation.  The patient was last seen 1 month ago.  At that time, she was complaining of recurrent sinusitis.  She has noted frequent left-sided facial swelling, nasal congestion, and difficulty breathing through her nose.  She was noted to have nasal mucosal congestion and bilateral inferior turbinate hypertrophy.  She subsequently underwent a sinus CT scan.  The CT showed no significant acute or chronic sinusitis.  Both inferior turbinates were hypertrophied.  The patient returns today reporting no significant change in her symptoms.  She is scheduled to undergo a dental extraction tomorrow.  Currently she denies any fever or visual change.  Exam: General: Communicates without difficulty, well nourished, no acute distress. Head: Normocephalic, no evidence injury, no tenderness, facial buttresses intact without stepoff. Face/sinus: No tenderness to palpation and percussion. Facial movement is normal and symmetric. Eyes: PERRL, EOMI. No scleral icterus, conjunctivae clear. Neuro: CN II exam reveals vision grossly intact.  No nystagmus at any point of gaze. Ears: Auricles well formed without lesions.  Ear canals are intact without mass or lesion.  No erythema or edema is appreciated.  The TMs are intact without fluid. Nose: External evaluation reveals normal support and skin without lesions.  Dorsum is intact.  Anterior rhinoscopy reveals congested mucosa over anterior aspect of inferior turbinates and intact septum.  No purulence noted. Oral:  Oral cavity and oropharynx are intact, symmetric, without erythema or edema.  Mucosa is moist without lesions. Neck: Full range of motion without pain.  There is no significant lymphadenopathy.  No masses palpable.  Thyroid bed within  normal limits to palpation.  Parotid glands and submandibular glands equal bilaterally without mass.  Trachea is midline. Neuro:  CN 2-12 grossly intact.   Plan: 1.  Chronic rhinitis with nasal mucosal congestion and bilateral inferior turbinate hypertrophy.  No significant acute or chronic sinusitis was noted on her recent CT scan. 2.  Her recurrent left-sided facial swelling may be secondary to her dental infection.  Plan: 1.  The physical exam findings and the CT images are reviewed with the patient. 2.  Flonase nasal spray 2 sprays each nostril daily. 3.  The patient is encouraged to call with any questions or concerns.

## 2023-12-27 ENCOUNTER — Ambulatory Visit: Payer: Medicare Other | Admitting: Podiatry

## 2023-12-27 ENCOUNTER — Encounter: Payer: Self-pay | Admitting: Podiatry

## 2023-12-27 DIAGNOSIS — N182 Chronic kidney disease, stage 2 (mild): Secondary | ICD-10-CM

## 2023-12-27 DIAGNOSIS — Q828 Other specified congenital malformations of skin: Secondary | ICD-10-CM

## 2023-12-27 DIAGNOSIS — M79674 Pain in right toe(s): Secondary | ICD-10-CM

## 2023-12-27 DIAGNOSIS — M216X1 Other acquired deformities of right foot: Secondary | ICD-10-CM | POA: Diagnosis not present

## 2023-12-27 DIAGNOSIS — B351 Tinea unguium: Secondary | ICD-10-CM

## 2023-12-27 DIAGNOSIS — M79675 Pain in left toe(s): Secondary | ICD-10-CM

## 2023-12-27 NOTE — Progress Notes (Signed)
This patient returns to my office for at risk foot care.  This patient requires this care by a professional since this patient will be at risk due to having chronic kidney disease stage  .  Patient says the area under her big toe joint is very painful.walking and wearing her shoes.  This patient is uable to trim her callus since she cannot reach her feet due to back problems.  She says the area under her big toe joint right foot throbs at night. This patient presents for at risk foot care today.  General Appearance  Alert, conversant and in no acute stress.  Vascular  Dorsalis pedis and posterior tibial  pulses are palpable  bilaterally.  Capillary return is within normal limits  bilaterally. Temperature is within normal limits  bilaterally.  Neurologic  Senn-Weinstein monofilament wire test within normal limits  bilaterally. Muscle power within normal limits bilaterally.  Nails Thick disfigured discolored nails with subungual debris  from hallux to fifth toes bilaterally. No evidence of bacterial infection or drainage bilaterally.  Orthopedic  No limitations of motion  feet .  No crepitus or effusions noted.  No bony pathology or digital deformities noted.  Enlarged and painful tibial sesamoid right foot.  Skin  normotropic skin   noted bilaterally.  No signs of infections or ulcers noted.   Porokeratosis sub 1 right foot.  Callus left hallux.  Sesamoiditis sub 1st MPJ right foot.  Consent was obtained for treatment procedures.   Discussed formation of callus due to the ribial sesamiod.   Debrided callus on both forefeet.  Nails done as a courtesy.   Return office visit    9   weeks                   Told patient to return for periodic foot care and evaluation due to potential at risk complications.   Helane Gunther DPM

## 2024-01-25 ENCOUNTER — Ambulatory Visit (INDEPENDENT_AMBULATORY_CARE_PROVIDER_SITE_OTHER): Payer: Medicare Other | Admitting: Otolaryngology

## 2024-01-25 VITALS — BP 124/76 | HR 85 | Ht 61.0 in | Wt 93.0 lb

## 2024-01-25 DIAGNOSIS — J343 Hypertrophy of nasal turbinates: Secondary | ICD-10-CM | POA: Diagnosis not present

## 2024-01-25 DIAGNOSIS — J310 Chronic rhinitis: Secondary | ICD-10-CM

## 2024-01-25 DIAGNOSIS — R0981 Nasal congestion: Secondary | ICD-10-CM | POA: Diagnosis not present

## 2024-01-25 DIAGNOSIS — H903 Sensorineural hearing loss, bilateral: Secondary | ICD-10-CM | POA: Diagnosis not present

## 2024-01-26 NOTE — Progress Notes (Signed)
 Patient ID: Catherine Harrington, female   DOB: November 29, 1946, 78 y.o.   MRN: [id number]  Follow-up: Chronic nasal congestion, right ear tinnitus  HPI: The patient is a 78 year old female who returns today for her follow-up evaluation.  The patient was previously seen for chronic nasal congestion and right ear tinnitus.  She was noted to have nasal mucosal congestion, bilateral inferior turbinate hypertrophy, and bilateral high-frequency sensorineural hearing loss.  The patient was treated with Flonase nasal spray.  The strategies to cope with her tinnitus were also discussed.  The patient returns today reporting resolution of her tinnitus.  She has not noted any significant change in her hearing.  Her nasal congestion has improved with the use of Flonase.  Currently she denies any otalgia, otorrhea, facial pain, or fever.  Exam: General: Communicates without difficulty, well nourished, no acute distress. Head: Normocephalic, no evidence injury, no tenderness, facial buttresses intact without stepoff. Face/sinus: No tenderness to palpation and percussion. Facial movement is normal and symmetric. Eyes: PERRL, EOMI. No scleral icterus, conjunctivae clear. Neuro: CN II exam reveals vision grossly intact.  No nystagmus at any point of gaze. Ears: Auricles well formed without lesions.  Ear canals are intact without mass or lesion.  No erythema or edema is appreciated.  The TMs are intact without fluid. Nose: External evaluation reveals normal support and skin without lesions.  Dorsum is intact.  Anterior rhinoscopy reveals congested mucosa over anterior aspect of inferior turbinates and intact septum.  No purulence noted. Oral:  Oral cavity and oropharynx are intact, symmetric, without erythema or edema.  Mucosa is moist without lesions. Neck: Full range of motion without pain.  There is no significant lymphadenopathy.  No masses palpable.  Thyroid bed within normal limits to palpation.  Parotid glands and submandibular  glands equal bilaterally without mass.  Trachea is midline. Neuro:  CN 2-12 grossly intact.   Assessment: 1.  Chronic rhinitis with nasal mucosal congestion and bilateral inferior turbinate hypertrophy.  The severity of her nasal congestion has decreased. 2.  Subjectively stable bilateral sensorineural hearing loss. 3.  Her tinnitus has resolved.  Plan: 1.  The physical exam findings are reviewed with the patient. 2.  Continue the use of Flonase nasal spray as needed. 3.  The patient is encouraged to call with any questions or concerns.

## 2024-01-30 ENCOUNTER — Inpatient Hospital Stay: Payer: BLUE CROSS/BLUE SHIELD | Attending: Hematology

## 2024-01-30 DIAGNOSIS — M179 Osteoarthritis of knee, unspecified: Secondary | ICD-10-CM | POA: Insufficient documentation

## 2024-01-30 DIAGNOSIS — D649 Anemia, unspecified: Secondary | ICD-10-CM | POA: Insufficient documentation

## 2024-01-30 DIAGNOSIS — D72819 Decreased white blood cell count, unspecified: Secondary | ICD-10-CM | POA: Insufficient documentation

## 2024-01-30 LAB — CBC WITH DIFFERENTIAL (CANCER CENTER ONLY)
Abs Immature Granulocytes: 0.01 10*3/uL (ref 0.00–0.07)
Basophils Absolute: 0 10*3/uL (ref 0.0–0.1)
Basophils Relative: 0 %
Eosinophils Absolute: 0 10*3/uL (ref 0.0–0.5)
Eosinophils Relative: 0 %
HCT: 31.9 % — ABNORMAL LOW (ref 36.0–46.0)
Hemoglobin: 10.9 g/dL — ABNORMAL LOW (ref 12.0–15.0)
Immature Granulocytes: 0 %
Lymphocytes Relative: 30 %
Lymphs Abs: 1.2 10*3/uL (ref 0.7–4.0)
MCH: 35.7 pg — ABNORMAL HIGH (ref 26.0–34.0)
MCHC: 34.2 g/dL (ref 30.0–36.0)
MCV: 104.6 fL — ABNORMAL HIGH (ref 80.0–100.0)
Monocytes Absolute: 0.4 10*3/uL (ref 0.1–1.0)
Monocytes Relative: 10 %
Neutro Abs: 2.2 10*3/uL (ref 1.7–7.7)
Neutrophils Relative %: 60 %
Platelet Count: 139 10*3/uL — ABNORMAL LOW (ref 150–400)
RBC: 3.05 MIL/uL — ABNORMAL LOW (ref 3.87–5.11)
RDW: 13 % (ref 11.5–15.5)
WBC Count: 3.8 10*3/uL — ABNORMAL LOW (ref 4.0–10.5)
nRBC: 0 % (ref 0.0–0.2)

## 2024-01-30 LAB — FERRITIN: Ferritin: 200 ng/mL (ref 11–307)

## 2024-02-03 ENCOUNTER — Encounter: Payer: Self-pay | Admitting: Surgical

## 2024-02-03 ENCOUNTER — Ambulatory Visit: Payer: BLUE CROSS/BLUE SHIELD | Admitting: Surgical

## 2024-02-03 DIAGNOSIS — M1711 Unilateral primary osteoarthritis, right knee: Secondary | ICD-10-CM | POA: Diagnosis not present

## 2024-02-03 MED ORDER — METHYLPREDNISOLONE ACETATE 40 MG/ML IJ SUSP
40.0000 mg | INTRAMUSCULAR | Status: AC | PRN
  Administered 2024-02-03: 40 mg via INTRA_ARTICULAR

## 2024-02-03 MED ORDER — LIDOCAINE HCL 1 % IJ SOLN
5.0000 mL | INTRAMUSCULAR | Status: AC | PRN
  Administered 2024-02-03: 5 mL

## 2024-02-03 MED ORDER — BUPIVACAINE HCL 0.25 % IJ SOLN
4.0000 mL | INTRAMUSCULAR | Status: AC | PRN
  Administered 2024-02-03: 4 mL via INTRA_ARTICULAR

## 2024-02-03 NOTE — Progress Notes (Signed)
 Office Visit Note   Patient: Catherine Harrington           Date of Birth: 19-Apr-1946           MRN: [id number] Visit Date: 02/03/2024 Requested by: Cleatis Polka., MD [address] [place],  [place] [address] PCP: Cleatis Polka., MD  Subjective: Chief Complaint  Patient presents with   Right Knee - Pain    HPI: Catherine Harrington is a 78 y.o. female who presents to the office reporting knee pain.  Has history of knee arthritis.  No new falls or injuries.  No fevers or chills.  Previous injections have provided good relief and they are here today to repeat injections.  She describes right knee pain with difficulty balancing after she first gets up from a seated position or when she gets up in the morning.  She ambulates with cane and with hinged knee brace.  .                ROS: All systems reviewed are negative as they relate to the chief complaint within the history of present illness.  Patient denies fevers or chills.  Assessment & Plan: Visit Diagnoses:  1. Arthritis of right knee     Plan: Patient is a 78 year old female who presents for reevaluation of right knee pain.  She has knee instability primarily with immobility but after she starts to get going, this improves.  Seems to correspond with her arthritis and this has previously been improved with injections.  Gel injection did give her a temporary increase in her pain but cortisone junctions have done well for her in the past.  She would like to repeat this today.  Cortisone injection administered and patient tolerated procedure well without complication.  Follow-up with the office as needed with next possible injection in 3 to 4 months.  Follow-Up Instructions: No follow-ups on file.   Orders:  No orders of the defined types were placed in this encounter.  No orders of the defined types were placed in this encounter.     Procedures: Large Joint Inj: R knee on 02/03/2024 3:30 PM Indications: diagnostic evaluation,  joint swelling and pain Details: 18 G 1.5 in needle, superolateral approach  Arthrogram: No  Medications: 5 mL lidocaine 1 %; 40 mg methylPREDNISolone acetate 40 MG/ML; 4 mL bupivacaine 0.25 % Outcome: tolerated well, no immediate complications Procedure, treatment alternatives, risks and benefits explained, specific risks discussed. Consent was given by the patient. Immediately prior to procedure a time out was called to verify the correct patient, procedure, equipment, support staff and site/side marked as required. Patient was prepped and draped in the usual sterile fashion.       Clinical Data: No additional findings.  Objective: Vital Signs: There were no vitals taken for this visit.  Physical Exam:  Constitutional: Patient appears well-developed HEENT:  Head: Normocephalic Eyes:EOM are normal Neck: Normal range of motion Cardiovascular: Normal rate Pulmonary/chest: Effort normal Neurologic: Patient is alert Skin: Skin is warm Psychiatric: Patient has normal mood and affect  Ortho Exam: Ortho exam demonstrates knees without cellulitis or skin changes.  Effusion present mildly.  No calf tenderness.  Negative Homans' sign.  No pain with hip range of motion.  Able to perform straight leg raise with both lower extremities.  Lower extremities warm and well-perfused.  Tenderness over the medial and lateral joint lines.  There is severe patellofemoral crepitus noted with any passive motion of the knee but  especially at full extension from 0 to 30 degrees.  She does have good range of motion of the right knee with 0 degrees extension and about 115 degrees of knee flexion.  Specialty Comments:  EXAM: MRI LUMBAR SPINE WITHOUT CONTRAST   TECHNIQUE: Multiplanar, multisequence MR imaging of the lumbar spine was performed. No intravenous contrast was administered.   COMPARISON:  05/09/2017   FINDINGS: Segmentation: 5 lumbar type vertebrae based on the available coverage    Alignment: Marked dextroscoliosis with rightward translation at L4-5. The curvature is centered at the level of L3.   Vertebrae: Degenerative fatty marrow conversion at L2-L5 primarily. No fracture, discitis, or aggressive bone lesion   Conus medullaris and cauda equina: Conus extends to the T12 level. Conus and cauda equina appear normal.   Paraspinal and other soft tissues: Marked muscular atrophy involving the intrinsic back muscles and bilateral psoas. No incidental inflammation or mass is seen about the spine.   Disc levels:   T12- L1: Unremarkable.   L1-L2: Disc collapse and endplate degeneration with ridging. No compressive spinal or foraminal stenosis   L2-L3: Asymmetric leftward disc collapse. Degenerative facet spurring asymmetric to the left. The canal and foramina are patent. The left subarticular recess is effaced.   L3-L4: Disc collapse asymmetric to the left. Intervertebral ankylosis has occurred remotely. Facet spurring asymmetric to the left where there is moderate foraminal narrowing. Left subarticular recess narrowing that could affect the L4 nerve root.   L4-L5: Disc collapse and endplate degeneration. Degenerative facet spurring asymmetric to the right. Advanced right foraminal stenosis. Right subarticular recess narrowing that could certainly affect the L5 nerve root. More moderate left subarticular recess narrowing.   L5-S1:Degenerative facet spurring with small joint effusions. Preserved disc height and hydration compared to the other levels. No neural compression.   IMPRESSION: 1. No acute or interval finding when compared to 2018. 2. Advanced lumbar spine degeneration and dextroscoliosis. 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 3. L4-5 right foraminal and subarticular recess impingement. 4. On the left there is subarticular recess stenosis at L2-3 to L4-5. 5. Advanced and generalized muscular atrophy.     Electronically Signed   By:  Marnee Spring M.D.   On: 09/12/2020 18:55  Imaging: No results found.   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   Right-sided tinnitus 10/05/2023   Sesamoiditis of right foot 07/26/2023   Pain due to onychomycosis of toenails of both feet 08/27/2020   Myofascial pain syndrome 03/20/2020   Scoliosis of thoracolumbar spine 02/18/2020   Spinal stenosis of lumbar region without neurogenic claudication 02/18/2020   Porokeratosis 01/16/2020   Fibrosis of liver 04/18/2018   Macrocytic anemia 06/29/2016   Chronic renal insufficiency, stage III (moderate) (HCC) 06/29/2016   Acute right flank pain 06/29/2016   Chronic renal insufficiency 06/29/2016   Hemochromatosis 05/18/2016   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/18/2016   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   Macrocytosis 08/16/2013   Syncope 08/16/2013   Diverticulosis of colon 11/04/2010   Past Medical History:  Diagnosis Date   Acute right flank pain 06/29/2016   Arthritis    "related to the lupus; joints" (08/16/2013)   Chronic renal insufficiency, stage III (moderate) (HCC) 06/29/2016   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   Family history of anesthesia complication    "daughter doesn't wake up easy from  it" (08/16/2013)   Fibrosis of liver 04/18/2018   Hemochromatosis 05/18/2016   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   Hypertension    Iron deficiency anemia    Lumbar disc disease    Lupus    "just the rash and arthritis" (08/16/2013)   Macrocytic anemia 06/29/2016   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/18/2016   Scoliosis    Syncope and collapse    "lost consciousness ~ 4-5 seconds; didn't hurt myself" (08/16/2013)    Family History  Problem Relation Age of Onset   Hypertension Mother    Hypertension Father     Past Surgical  History:  Procedure Laterality Date   CATARACT EXTRACTION Bilateral 10/19 11/19   CESAREAN SECTION  1967; 1971   MOUTH SURGERY  2013   " infection under bridge cleaned out, etc" (08/16/2013)   TUBAL LIGATION     Social History   Occupational History   Occupation: retired  Tobacco Use   Smoking status: Every Day    Current packs/day: 1.00    Average packs/day: 1 pack/day for 48.0 years (48.0 ttl pk-yrs)    Types: Cigarettes   Smokeless tobacco: Never  Substance and Sexual Activity   Alcohol use: Yes    Alcohol/week: 21.0 standard drinks of alcohol    Types: 21 Cans of beer per week    Comment:  3-4 drinks daily sometimes.   Drug use: No   Sexual activity: Not Currently    Partners: Male    Birth control/protection: Post-menopausal

## 2024-02-06 ENCOUNTER — Inpatient Hospital Stay (HOSPITAL_BASED_OUTPATIENT_CLINIC_OR_DEPARTMENT_OTHER): Payer: BLUE CROSS/BLUE SHIELD | Admitting: Hematology

## 2024-02-06 ENCOUNTER — Inpatient Hospital Stay: Payer: BLUE CROSS/BLUE SHIELD

## 2024-02-06 NOTE — Assessment & Plan Note (Signed)
 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  She never had liver biopsy.  -US Abdomen 01/02/21 showed heterogeneous, subtly echogenic appearance of liver without focal liver lesion. -MRI abdomen on 05/18/22 was negative. -she has been undergoing phlebotomy as needed to keep her ferritin under 300, last 01/2020. She does not like them, mainly due to pain (even with emla cream), and has declined unless absolutely necessary. -We discussed the option of oral iron chelating agents, potential side effects.  I encouraged her to continue phlebotomy as she can tolerate, but will consider iron chelating if she is not able to do more phlebotomy -she agrees to continue labs and phlebotomy as needed every 6-9 months.

## 2024-02-06 NOTE — Progress Notes (Signed)
 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  She never had liver biopsy.  -US Abdomen 01/02/21 showed heterogeneous, subtly echogenic appearance of liver without focal liver lesion. -MRI abdomen on 05/18/22 was negative. -she has been undergoing phlebotomy as needed to keep her ferritin under 300, last 01/2020. She does not like them, mainly due to pain (even with emla cream), and has declined unless absolutely necessary. -We discussed the option of oral iron chelating agents, potential side effects.  I encouraged her to continue phlebotomy as she can tolerate, but will consider iron chelating if she is not able to do more phlebotomy -she agrees to continue labs and phlebotomy as needed every 6-9 months.   Assessment and Plan    Hemochromatosis Iron levels well-managed, ranging between 100-200. Last phlebotomy in July 2022. Prefers biannual iron checks due to clinic access issues. Mild anemia, thrombocytopenia, and leukopenia noted but not problematic. No evidence of liver cirrhosis from recent tests. - Check iron levels biannually - Cancel phlebotomy unless iron levels are elevated - Order ultrasound next year if indicated  Osteoarthritis of the Knee Knee pain managed with recent cortisone injection. Concerned about meloxicam's impact on liver  and iron levels. Advised meloxicam is similar to NSAIDs like Advil/Aleve and may affect kidney function but is safe for occasional use. Mild kidney disorder noted but not problematic. - Use meloxicam PRN for knee pain - Encourage use of a walker to prevent falls  General Health Maintenance Received flu shot and other recommended vaccines. Mild anemia, thrombocytopenia, and leukopenia noted but not problematic. - Continue regular vaccinations - Provide copy of blood test results  Plan -Lab reviewed, she does not need for mild anemia -Lab every 6 months, will schedule phlebotomy if ferritin more than 300 -Follow-up in 1 year      SUMMARY OF ONCOLOGIC HISTORY: Oncology History   No history exists.     Discussed the use of AI scribe software for clinical note transcription with the patient, who gave verbal consent to proceed.  History of Present Illness   Catherine Harrington, a 78 year old patient with a history of hemochromatosis, presents for a routine follow-up. She has been managing her condition with phlebotomy, with her last session in July 2022. She reports no phlebotomies in the past year, and her iron levels have been within acceptable ranges. She declined medication to lower her iron levels due to cost and potential side effects.  In addition to her hemochromatosis, Catherine Harrington has arthritis in her knee, for which she recently received an injection. She has been prescribed meloxicam for the pain but has not started it due to concerns about potential effects on her liver and iron levels.  Catherine Harrington also reports having a mild kidney disorder and lifelong mild anemia. She has some difficulty with mobility due to her knee pain and uses a cane or the walls for support at home. She plans to hire help for house  cleaning due to her back pain from scoliosis.         All other systems were reviewed with the patient and are negative.  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   Family history of anesthesia complication    "daughter doesn't wake up easy from it" (08/16/2013)   Fibrosis of liver 04/18/2018   Hemochromatosis 05/18/2016   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   Hypertension    Iron deficiency anemia    Lumbar disc disease    Lupus    "just the rash and arthritis" (08/16/2013)   Macrocytic anemia 06/29/2016   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  ALLERGIES:  is allergic to prednisone, sulfonamide derivatives, epinephrine, and midol [ibuprofen].  MEDICATIONS:  Current Outpatient Medications  Medication Sig Dispense Refill   acetaminophen-codeine (TYLENOL #3) 300-30 MG tablet Take 1 tablet by mouth every 8 (eight) hours as needed for moderate pain. 15 tablet 0   baclofen (LIORESAL) 10 MG tablet Take 1/2 to 1 by mouth every 8hrs as needed for spasm 60 tablet 0   baclofen (LIORESAL) 10 MG tablet 1/2 tablet po q 8 hours prn 30 each 0   calcium carbonate (OS-CAL) 600 MG TABS Take 600 mg by mouth 2 (two) times daily with a meal.     CREON 24000-76000 units CPEP      dexamethasone 0.5 MG/5ML elixir SWISH 5 MLS IN THE MOUTH FOR 3 MINUTES THEN SPIT THREE TIMES DAILY FOR 3 WEEKS     diazepam (VALIUM) 5 MG tablet Take one tablet by mouth with food one hour prior to procedure. May repeat 30 minutes prior if  needed. 2 tablet 0   diazepam (VALIUM) 5 MG tablet Take 1 tablet (5 mg total) by mouth every 12 (twelve) hours as needed for anxiety. 4 tablet 0   doxazosin (CARDURA) 8 MG tablet Take 8 mg by mouth at bedtime.     doxycycline (VIBRAMYCIN) 100 MG capsule Take 1 capsule (100 mg total) by mouth 2 (two) times daily. 20 capsule 0   fexofenadine (ALLEGRA) 180 MG tablet Take 180 mg by mouth daily.     fluticasone (FLONASE) 50 MCG/ACT nasal spray Place 1 spray into both nostrils daily.     gabapentin (NEURONTIN) 300 MG capsule TAKE 1 CAPSULE(300 MG) BY MOUTH AT BEDTIME 90 capsule 3   hydrOXYzine (ATARAX/VISTARIL) 10 MG tablet Take by mouth.     levothyroxine (SYNTHROID, LEVOTHROID) 50 MCG tablet Take 50 mcg by mouth daily before breakfast.     lidocaine (XYLOCAINE) 2 % solution SMARTSIG:3-5 Milliliter(s) By Mouth Every 4-6 Hours PRN     lidocaine-prilocaine (EMLA) cream Apply 1 application topically as needed. 30 g 0   LORazepam (ATIVAN) 0.5 MG tablet Take 1 tablet prior to phlebotomy 2  tablet 0   losartan (COZAAR) 100 MG tablet Take 100 mg by mouth daily.     meloxicam (MOBIC) 7.5 MG tablet Take 1 tablet (7.5 mg total) by mouth daily as needed for pain. 30 tablet 0   meloxicam (MOBIC) 7.5 MG tablet Take 1 tablet (7.5 mg total) by mouth daily as needed for pain. 30 tablet 1   milk thistle 175 MG tablet Take 250 mg by mouth daily.     Multiple Vitamin (MULTIVITAMIN) tablet Take 1 tablet by mouth daily.     Potassium Gluconate 595 MG CAPS Take by mouth.     rosuvastatin (CRESTOR) 10 MG tablet Take 10 mg by mouth at bedtime.     vitamin B-12 (CYANOCOBALAMIN) 1000 MCG tablet Take 1,000 mcg by mouth daily.     Current Facility-Administered Medications  Medication Dose Route Frequency Provider Last Rate Last Admin   sodium chloride 0.9 % injection 250 mL  250 mL Intravenous Once Burns Spain, MD        PHYSICAL EXAMINATION: ECOG PERFORMANCE STATUS: 2 - Symptomatic, <50% confined to  bed  Vitals:   02/06/24 1437  BP: (!) 122/56  Pulse: 73  Resp: 19  Temp: 98.8 F (37.1 C)  SpO2: 100%   Wt Readings from Last 3 Encounters:  02/06/24 92 lb 14.4 oz (42.1 kg)  01/25/24 93 lb (42.2 kg)  11/02/23 93 lb (42.2 kg)     GENERAL:alert, no distress and comfortable SKIN: skin color, texture, turgor are normal, no rashes or significant lesions EYES: normal, Conjunctiva are pink and non-injected, sclera clear Musculoskeletal:no cyanosis of digits and no clubbing  NEURO: alert & oriented x 3 with fluent speech, no focal motor/sensory deficits   LABORATORY DATA:  I have reviewed the data as listed    Latest Ref Rng & Units 01/30/2024    2:09 PM 06/03/2023    2:23 PM 06/03/2022    2:24 PM  CBC  WBC 4.0 - 10.5 K/uL 3.8  3.4  4.1   Hemoglobin 12.0 - 15.0 g/dL 40.9  81.1  91.4   Hematocrit 36.0 - 46.0 % 31.9  31.2  31.1   Platelets 150 - 400 K/uL 139  160  150         Latest Ref Rng & Units 06/03/2023    2:23 PM 06/03/2022    2:24 PM 05/29/2021    1:53 PM  CMP  Glucose 70 - 99 mg/dL 90  81  93   BUN 8 - 23 mg/dL 15  16  14    Creatinine 0.44 - 1.00 mg/dL 7.82  9.56  2.13   Sodium 135 - 145 mmol/L 134  136  132   Potassium 3.5 - 5.1 mmol/L 4.2  4.2  4.4   Chloride 98 - 111 mmol/L 102  107  101   CO2 22 - 32 mmol/L 24  23  19    Calcium 8.9 - 10.3 mg/dL 9.5  9.3  9.1   Total Protein 6.5 - 8.1 g/dL 7.0  7.7  7.7   Total Bilirubin 0.3 - 1.2 mg/dL 0.7  0.5  0.6   Alkaline Phos 38 - 126 U/L 68  60  95   AST 15 - 41 U/L 57  63  154   ALT 0 - 44 U/L 41  41  88       RADIOGRAPHIC STUDIES: I have personally reviewed the radiological images as listed and agreed with the findings in the report. No results found.  No orders of the defined types were placed in this encounter.  All questions were answered. The patient knows to call the clinic with any problems, questions or concerns. No barriers to learning was detected. The total time spent in the appointment was 15  minutes.     Malachy Mood, MD 02/06/2024

## 2024-02-16 ENCOUNTER — Telehealth (INDEPENDENT_AMBULATORY_CARE_PROVIDER_SITE_OTHER): Payer: Self-pay | Admitting: Otolaryngology

## 2024-02-16 NOTE — Telephone Encounter (Signed)
 Flonase 50 mcg  for 90 days was called in with 1 refill at [facility] on [address].

## 2024-02-16 NOTE — Telephone Encounter (Deleted)
 Flonase 50 mcg nasal spray 90 day supply was called in at [facility] on [address] with one refill.

## 2024-02-27 ENCOUNTER — Encounter: Payer: Self-pay | Admitting: Hematology

## 2024-02-27 ENCOUNTER — Ambulatory Visit (INDEPENDENT_AMBULATORY_CARE_PROVIDER_SITE_OTHER): Admitting: Surgical

## 2024-02-27 ENCOUNTER — Encounter: Payer: Self-pay | Admitting: Surgical

## 2024-02-27 DIAGNOSIS — M1711 Unilateral primary osteoarthritis, right knee: Secondary | ICD-10-CM

## 2024-02-27 NOTE — Progress Notes (Signed)
 Follow-up Office Visit Note   Patient: Catherine Harrington           Date of Birth: September 11, 1946           MRN: [id number] Visit Date: 02/27/2024 Requested by: Cleatis Polka., MD [address] [place],  [place] [address] PCP: Cleatis Polka., MD  Subjective: Chief Complaint  Patient presents with   Right Knee - Pain, Follow-up    HPI: Catherine Harrington is a 78 y.o. female who returns to the office for follow-up visit.    Plan at last visit was: Patient is a 78 year old female who presents for reevaluation of right knee pain. She has knee instability primarily with immobility but after she starts to get going, this improves. Seems to correspond with her arthritis and this has previously been improved with injections. Gel injection did give her a temporary increase in her pain but cortisone junctions have done well for her in the past. She would like to repeat this today. Cortisone injection administered and patient tolerated procedure well without complication. Follow-up with the office as needed with next possible injection in 3 to 4 months.   Since then, patient notes cortisone injection provided good relief for about 1 to 2 weeks of her stiffness and balance difficulties when she first gets up.  No new incident or fall or injury.  Really does not have a lot of pain, most of her complaint is the stiffness and instability.  This does not bother her after she gets going but primarily bothers her when she first gets up from a seated position after sitting for long period of time.              ROS: All systems reviewed are negative as they relate to the chief complaint within the history of present illness.  Patient denies fevers or chills.  Assessment & Plan: Visit Diagnoses:  1. Arthritis of right knee     Plan: Catherine Harrington is a 78 y.o. female who returns to the office for follow-up visit.  Plan from last visit was noted above in HPI.  They now return with return of right knee  stiffness and instability when she first gets up.  Cortisone injection helped for about 2 weeks.  Ambulating with cane.  She would like to try right knee gel injection which she has had in the past.  It did cause about 1 to 2 days of increased pain but after that, pain was a lot better along with the stiffness and instability for several months.  We will preapproved her for right knee gel injection to be done on a Thursday with an appointment for the following Friday for possible Toradol injection in case her pain severely worsens with difficulty weightbearing like she has had in the past  This patient is diagnosed with osteoarthritis of the knee(s).    Radiographs show evidence of joint space narrowing, osteophytes, subchondral sclerosis and/or subchondral cysts.  This patient has knee pain which interferes with functional and activities of daily living.    This patient has experienced inadequate response, adverse effects and/or intolerance with conservative treatments such as acetaminophen, NSAIDS, topical creams, physical therapy or regular exercise, knee bracing and/or weight loss.   This patient has experienced inadequate response or has a contraindication to intra articular steroid injections for at least 3 months.   This patient is not scheduled to have a total knee replacement within 6 months of starting treatment with viscosupplementation.  Follow-Up Instructions: Return for gel inj.   Orders:  No orders of the defined types were placed in this encounter.  No orders of the defined types were placed in this encounter.     Procedures: No procedures performed   Clinical Data: No additional findings.  Objective: Vital Signs: There were no vitals taken for this visit.  Physical Exam:  Constitutional: Patient appears well-developed HEENT:  Head: Normocephalic Eyes:EOM are normal Neck: Normal range of motion Cardiovascular: Normal rate Pulmonary/chest: Effort  normal Neurologic: Patient is alert Skin: Skin is warm Psychiatric: Patient has normal mood and affect  Ortho Exam: Ortho exam demonstrates right knee with moderate effusion.  No significant tenderness over the medial or lateral joint lines.  No change in range of motion compared with last visit.  There is patellofemoral crepitus again noted.  Able to perform straight leg raise with good quad strength.  Specialty Comments:  EXAM: MRI LUMBAR SPINE WITHOUT CONTRAST   TECHNIQUE: Multiplanar, multisequence MR imaging of the lumbar spine was performed. No intravenous contrast was administered.   COMPARISON:  05/09/2017   FINDINGS: Segmentation: 5 lumbar type vertebrae based on the available coverage   Alignment: Marked dextroscoliosis with rightward translation at L4-5. The curvature is centered at the level of L3.   Vertebrae: Degenerative fatty marrow conversion at L2-L5 primarily. No fracture, discitis, or aggressive bone lesion   Conus medullaris and cauda equina: Conus extends to the T12 level. Conus and cauda equina appear normal.   Paraspinal and other soft tissues: Marked muscular atrophy involving the intrinsic back muscles and bilateral psoas. No incidental inflammation or mass is seen about the spine.   Disc levels:   T12- L1: Unremarkable.   L1-L2: Disc collapse and endplate degeneration with ridging. No compressive spinal or foraminal stenosis   L2-L3: Asymmetric leftward disc collapse. Degenerative facet spurring asymmetric to the left. The canal and foramina are patent. The left subarticular recess is effaced.   L3-L4: Disc collapse asymmetric to the left. Intervertebral ankylosis has occurred remotely. Facet spurring asymmetric to the left where there is moderate foraminal narrowing. Left subarticular recess narrowing that could affect the L4 nerve root.   L4-L5: Disc collapse and endplate degeneration. Degenerative facet spurring asymmetric to the right.  Advanced right foraminal stenosis. Right subarticular recess narrowing that could certainly affect the L5 nerve root. More moderate left subarticular recess narrowing.   L5-S1:Degenerative facet spurring with small joint effusions. Preserved disc height and hydration compared to the other levels. No neural compression.   IMPRESSION: 1. No acute or interval finding when compared to 2018. 2. Advanced lumbar spine degeneration and dextroscoliosis. 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 3. L4-5 right foraminal and subarticular recess impingement. 4. On the left there is subarticular recess stenosis at L2-3 to L4-5. 5. Advanced and generalized muscular atrophy.     Electronically Signed   By: Marnee Spring M.D.   On: 09/12/2020 18:55  Imaging: No results found.   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   Right-sided tinnitus 10/05/2023   Sesamoiditis of right foot 07/26/2023   Pain due to onychomycosis of toenails of both feet 08/27/2020   Myofascial pain syndrome 03/20/2020   Scoliosis of thoracolumbar spine 02/18/2020   Spinal stenosis of lumbar region without neurogenic claudication 02/18/2020   Porokeratosis 01/16/2020   Fibrosis of  liver 04/18/2018   Macrocytic anemia 06/29/2016   Chronic renal insufficiency, stage III (moderate) (HCC) 06/29/2016   Acute right flank pain 06/29/2016   Chronic renal insufficiency 06/29/2016   Hemochromatosis 05/18/2016   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/18/2016   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   Macrocytosis 08/16/2013   Syncope 08/16/2013   Diverticulosis of colon 11/04/2010   Past Medical History:  Diagnosis Date   Acute right flank pain 06/29/2016   Arthritis    "related to the lupus; joints" (08/16/2013)   Chronic  renal insufficiency, stage III (moderate) (HCC) 06/29/2016   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   Family history of anesthesia complication    "daughter doesn't wake up easy from it" (08/16/2013)   Fibrosis of liver 04/18/2018   Hemochromatosis 05/18/2016   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   Hypertension    Iron deficiency anemia    Lumbar disc disease    Lupus    "just the rash and arthritis" (08/16/2013)   Macrocytic anemia 06/29/2016   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/18/2016   Scoliosis    Syncope and collapse    "lost consciousness ~ 4-5 seconds; didn't hurt myself" (08/16/2013)    Family History  Problem Relation Age of Onset   Hypertension Mother    Hypertension Father     Past Surgical History:  Procedure Laterality Date   CATARACT EXTRACTION Bilateral 10/19 11/19   CESAREAN SECTION  1967; 1971   MOUTH SURGERY  2013   " infection under bridge cleaned out, etc" (08/16/2013)   TUBAL LIGATION     Social History   Occupational History   Occupation: retired  Tobacco Use   Smoking status: Every Day    Current packs/day: 1.00    Average packs/day: 1 pack/day for 48.0 years (48.0 ttl pk-yrs)    Types: Cigarettes   Smokeless tobacco: Never  Substance and Sexual Activity   Alcohol use: Yes    Alcohol/week: 21.0 standard drinks of alcohol    Types: 21 Cans of beer per week    Comment:  3-4 drinks daily sometimes.   Drug use: No   Sexual activity: Not Currently    Partners: Male    Birth control/protection: Post-menopausal

## 2024-02-28 ENCOUNTER — Encounter: Payer: Self-pay | Admitting: Hematology

## 2024-02-28 ENCOUNTER — Ambulatory Visit (INDEPENDENT_AMBULATORY_CARE_PROVIDER_SITE_OTHER): Payer: Medicare Other | Admitting: Podiatry

## 2024-02-28 DIAGNOSIS — M216X1 Other acquired deformities of right foot: Secondary | ICD-10-CM | POA: Diagnosis not present

## 2024-02-28 DIAGNOSIS — M25871 Other specified joint disorders, right ankle and foot: Secondary | ICD-10-CM

## 2024-02-28 NOTE — Progress Notes (Signed)
 This patient returns to my office for at risk foot care.  This patient requires this care by a professional since this patient will be at risk due to having chronic kidney disease stage  .  Patient says the area under her big toe joint is very painful.walking and wearing her shoes.  This patient is uable to trim her callus since she cannot reach her feet due to back problems.  She says the area under her big toe joint right foot throbs at night. This patient presents for at risk foot care today.  General Appearance  Alert, conversant and in no acute stress.  Vascular  Dorsalis pedis and posterior tibial  pulses are palpable  bilaterally.  Capillary return is within normal limits  bilaterally. Temperature is within normal limits  bilaterally.  Neurologic  Senn-Weinstein monofilament wire test within normal limits  bilaterally. Muscle power within normal limits bilaterally.  Nails Thick disfigured discolored nails with subungual debris  from hallux to fifth toes bilaterally. No evidence of bacterial infection or drainage bilaterally.  Orthopedic  No limitations of motion  feet .  No crepitus or effusions noted.  No bony pathology or digital deformities noted.  Enlarged and painful tibial sesamoid right foot.  Skin  normotropic skin   noted bilaterally.  No signs of infections or ulcers noted.   Porokeratosis sub 1 right foot.  Callus left hallux.  Sesamoiditis sub 1st MPJ right foot.  Consent was obtained for treatment procedures.   Discussed formation of callus due to the ribial sesamiod.   Debrided callus on both forefeet.  Nails done as a courtesy.   Return office visit    9   weeks                   Told patient to return for periodic foot care and evaluation due to potential at risk complications.   Helane Gunther DPM

## 2024-03-02 ENCOUNTER — Telehealth: Payer: Self-pay

## 2024-03-02 NOTE — Telephone Encounter (Signed)
Auth needed for right knee gel  

## 2024-03-09 NOTE — Telephone Encounter (Signed)
 VOB submitted for Orthovisc, right knee

## 2024-03-14 ENCOUNTER — Other Ambulatory Visit: Payer: Self-pay

## 2024-03-14 DIAGNOSIS — M1711 Unilateral primary osteoarthritis, right knee: Secondary | ICD-10-CM

## 2024-03-26 ENCOUNTER — Ambulatory Visit (INDEPENDENT_AMBULATORY_CARE_PROVIDER_SITE_OTHER): Admitting: Surgical

## 2024-03-26 ENCOUNTER — Encounter: Payer: Self-pay | Admitting: Surgical

## 2024-03-26 DIAGNOSIS — M1711 Unilateral primary osteoarthritis, right knee: Secondary | ICD-10-CM | POA: Diagnosis not present

## 2024-03-26 MED ORDER — HYALURONAN 30 MG/2ML IX SOSY
30.0000 mg | PREFILLED_SYRINGE | INTRA_ARTICULAR | Status: AC | PRN
  Administered 2024-03-26: 30 mg via INTRA_ARTICULAR

## 2024-03-26 MED ORDER — LIDOCAINE HCL 1 % IJ SOLN
5.0000 mL | INTRAMUSCULAR | Status: AC | PRN
  Administered 2024-03-26: 5 mL

## 2024-03-26 NOTE — Progress Notes (Signed)
   Procedure Note  Patient: Catherine Harrington             Date of Birth: Sep 18, 1946           MRN: [id number]             Visit Date: 03/26/2024  Procedures: Visit Diagnoses:  1. Arthritis of right knee     Large Joint Inj: R knee on 03/26/2024 3:17 PM Indications: pain, joint swelling and diagnostic evaluation Details: 18 G 1.5 in needle, superolateral approach  Arthrogram: No  Medications: 5 mL lidocaine  1 %; 30 mg Hyaluronan 30 MG/2ML Outcome: tolerated well, no immediate complications Procedure, treatment alternatives, risks and benefits explained, specific risks discussed. Consent was given by the patient. Immediately prior to procedure a time out was called to verify the correct patient, procedure, equipment, support staff and site/side marked as required. Patient was prepped and draped in the usual sterile fashion.

## 2024-03-29 ENCOUNTER — Telehealth: Payer: Self-pay | Admitting: Surgical

## 2024-03-29 NOTE — Telephone Encounter (Signed)
 Patient called. Would like to know if she could get the knee brace Van Gelinas talked to her about. Her cb# [phone]

## 2024-03-30 NOTE — Telephone Encounter (Signed)
That's good with me

## 2024-03-30 NOTE — Telephone Encounter (Signed)
 We can get her a hinge knee brace at her appointment on 5/5

## 2024-04-02 ENCOUNTER — Ambulatory Visit (INDEPENDENT_AMBULATORY_CARE_PROVIDER_SITE_OTHER): Admitting: Surgical

## 2024-04-02 DIAGNOSIS — M1711 Unilateral primary osteoarthritis, right knee: Secondary | ICD-10-CM

## 2024-04-02 DIAGNOSIS — M1990 Unspecified osteoarthritis, unspecified site: Secondary | ICD-10-CM

## 2024-04-04 ENCOUNTER — Encounter: Payer: Self-pay | Admitting: Surgical

## 2024-04-04 MED ORDER — LIDOCAINE HCL 1 % IJ SOLN
5.0000 mL | INTRAMUSCULAR | Status: AC | PRN
  Administered 2024-04-02: 5 mL

## 2024-04-04 MED ORDER — HYALURONAN 30 MG/2ML IX SOSY
30.0000 mg | PREFILLED_SYRINGE | INTRA_ARTICULAR | Status: AC | PRN
  Administered 2024-04-02: 30 mg via INTRA_ARTICULAR

## 2024-04-04 NOTE — Progress Notes (Signed)
   Procedure Note  Patient: ASTRAEA GALELLA             Date of Birth: 06-Jan-1946           MRN: [id number]             Visit Date: 04/02/2024  Procedures: Visit Diagnoses:  1. Arthritis     Large Joint Inj: R knee on 04/02/2024 12:42 PM Indications: diagnostic evaluation, joint swelling and pain Details: 18 G 1.5 in needle, superolateral approach  Arthrogram: No  Medications: 5 mL lidocaine  1 %; 30 mg Hyaluronan 30 MG/2ML Outcome: tolerated well, no immediate complications Procedure, treatment alternatives, risks and benefits explained, specific risks discussed. Consent was given by the patient. Immediately prior to procedure a time out was called to verify the correct patient, procedure, equipment, support staff and site/side marked as required. Patient was prepped and draped in the usual sterile fashion.

## 2024-04-09 ENCOUNTER — Ambulatory Visit (INDEPENDENT_AMBULATORY_CARE_PROVIDER_SITE_OTHER): Admitting: Surgical

## 2024-04-09 DIAGNOSIS — M1711 Unilateral primary osteoarthritis, right knee: Secondary | ICD-10-CM

## 2024-04-11 ENCOUNTER — Encounter: Payer: Self-pay | Admitting: Surgical

## 2024-04-11 MED ORDER — HYALURONAN 30 MG/2ML IX SOSY
30.0000 mg | PREFILLED_SYRINGE | INTRA_ARTICULAR | Status: AC | PRN
  Administered 2024-04-09: 30 mg via INTRA_ARTICULAR

## 2024-04-11 MED ORDER — LIDOCAINE HCL 1 % IJ SOLN
5.0000 mL | INTRAMUSCULAR | Status: AC | PRN
  Administered 2024-04-09: 5 mL

## 2024-04-11 NOTE — Progress Notes (Signed)
   Procedure Note  Patient: Catherine Harrington             Date of Birth: [birth year]           MRN: [id number]             Visit Date: 04/09/2024  Procedures: Visit Diagnoses: No diagnosis found.  Large Joint Inj: R knee on 04/09/2024 9:23 AM Indications: pain, joint swelling and diagnostic evaluation Details: 18 G 1.5 in needle, superolateral approach  Arthrogram: No  Medications: 5 mL lidocaine  1 %; 30 mg Hyaluronan 30 MG/2ML Outcome: tolerated well, no immediate complications  Orthovisc No. 3 injected today Procedure, treatment alternatives, risks and benefits explained, specific risks discussed. Consent was given by the patient. Immediately prior to procedure a time out was called to verify the correct patient, procedure, equipment, support staff and site/side marked as required. Patient was prepped and draped in the usual sterile fashion.

## 2024-04-13 ENCOUNTER — Other Ambulatory Visit: Payer: Self-pay | Admitting: Radiology

## 2024-05-21 ENCOUNTER — Other Ambulatory Visit: Payer: Self-pay | Admitting: Internal Medicine

## 2024-05-21 DIAGNOSIS — F17200 Nicotine dependence, unspecified, uncomplicated: Secondary | ICD-10-CM

## 2024-05-22 ENCOUNTER — Other Ambulatory Visit: Payer: Self-pay

## 2024-05-22 ENCOUNTER — Other Ambulatory Visit: Payer: Self-pay | Admitting: Internal Medicine

## 2024-05-22 DIAGNOSIS — K862 Cyst of pancreas: Secondary | ICD-10-CM

## 2024-05-28 ENCOUNTER — Other Ambulatory Visit: Payer: Self-pay | Admitting: Internal Medicine

## 2024-05-28 DIAGNOSIS — Z1231 Encounter for screening mammogram for malignant neoplasm of breast: Secondary | ICD-10-CM

## 2024-05-29 ENCOUNTER — Encounter: Payer: Self-pay | Admitting: Podiatry

## 2024-05-29 ENCOUNTER — Ambulatory Visit (INDEPENDENT_AMBULATORY_CARE_PROVIDER_SITE_OTHER): Admitting: Podiatry

## 2024-05-29 DIAGNOSIS — Q828 Other specified congenital malformations of skin: Secondary | ICD-10-CM

## 2024-05-29 NOTE — Progress Notes (Signed)
This patient returns to my office for at risk foot care.  This patient requires this care by a professional since this patient will be at risk due to having chronic kidney disease stage  3.  Patient says the callus are painful walking and wearing her shoes.  This patient is uable to trim her callus since she cannot reach her feet due to back problems.  This patient presents for at risk foot care today.  General Appearance  Alert, conversant and in no acute stress.  Vascular  Dorsalis pedis and posterior tibial  pulses are palpable  bilaterally.  Capillary return is within normal limits  bilaterally. Temperature is within normal limits  bilaterally.  Neurologic  Senn-Weinstein monofilament wire test within normal limits  bilaterally. Muscle power within normal limits bilaterally.  Nails Thick disfigured discolored nails with subungual debris  from hallux to fifth toes bilaterally. No evidence of bacterial infection or drainage bilaterally.  Orthopedic  No limitations of motion  feet .  No crepitus or effusions noted.  No bony pathology or digital deformities noted.  Enlarged and painful tibial sesamoid right foot.  Skin  normotropic skin   noted bilaterally.  No signs of infections or ulcers noted.   Porokeratosis sub 1 right foot.  Callus left hallux.  Porokeratosis  B/L.  Sesamoiditis sub 1st MPJ right foot.  Consent was obtained for treatment procedures.   Debridement of calluses both feet with a dremel tool and # 15 blade.   Nails done as a courtesy.   Return office visit    9   weeks                   Told patient to return for periodic foot care and evaluation due to potential at risk complications.   Helane Gunther DPM

## 2024-06-04 ENCOUNTER — Other Ambulatory Visit: Payer: Self-pay | Admitting: Internal Medicine

## 2024-06-04 DIAGNOSIS — F17200 Nicotine dependence, unspecified, uncomplicated: Secondary | ICD-10-CM

## 2024-06-11 ENCOUNTER — Ambulatory Visit (HOSPITAL_COMMUNITY)
Admission: RE | Admit: 2024-06-11 | Discharge: 2024-06-11 | Disposition: A | Discharge status: Home / Self Care | Source: Ambulatory Visit | Attending: Surgery | Admitting: Surgery

## 2024-06-11 ENCOUNTER — Other Ambulatory Visit (HOSPITAL_COMMUNITY): Payer: Self-pay | Admitting: Internal Medicine

## 2024-06-11 DIAGNOSIS — I6521 Occlusion and stenosis of right carotid artery: Secondary | ICD-10-CM

## 2024-06-15 ENCOUNTER — Ambulatory Visit
Admission: RE | Admit: 2024-06-15 | Discharge: 2024-06-15 | Disposition: A | Discharge status: Home / Self Care | Source: Ambulatory Visit | Attending: Internal Medicine | Admitting: Internal Medicine

## 2024-06-15 DIAGNOSIS — Z1231 Encounter for screening mammogram for malignant neoplasm of breast: Secondary | ICD-10-CM

## 2024-06-18 ENCOUNTER — Other Ambulatory Visit

## 2024-06-21 ENCOUNTER — Ambulatory Visit
Admission: RE | Admit: 2024-06-21 | Discharge: 2024-06-21 | Disposition: A | Discharge status: Home / Self Care | Source: Ambulatory Visit | Attending: Internal Medicine | Admitting: Internal Medicine

## 2024-06-21 ENCOUNTER — Inpatient Hospital Stay
Admission: RE | Admit: 2024-06-21 | Discharge: 2024-06-21 | Discharge status: Home / Self Care | Source: Ambulatory Visit | Attending: Internal Medicine | Admitting: Internal Medicine

## 2024-06-21 DIAGNOSIS — K862 Cyst of pancreas: Secondary | ICD-10-CM

## 2024-06-21 DIAGNOSIS — F17200 Nicotine dependence, unspecified, uncomplicated: Secondary | ICD-10-CM

## 2024-06-21 MED ORDER — GADOPICLENOL 0.5 MMOL/ML IV SOLN
4.0000 mL | Freq: Once | INTRAVENOUS | Status: AC | PRN
  Administered 2024-06-21: 4 mL via INTRAVENOUS

## 2024-06-29 ENCOUNTER — Encounter: Payer: Self-pay | Admitting: Surgical

## 2024-06-29 ENCOUNTER — Ambulatory Visit (INDEPENDENT_AMBULATORY_CARE_PROVIDER_SITE_OTHER): Admitting: Surgical

## 2024-06-29 DIAGNOSIS — M1711 Unilateral primary osteoarthritis, right knee: Secondary | ICD-10-CM

## 2024-06-29 NOTE — Progress Notes (Signed)
 Follow-up Office Visit Note   Patient: Catherine Harrington           Date of Birth: 1946-01-23           MRN: [id number] Visit Date: 06/29/2024 Requested by: Loreli Elsie JONETTA Mickey., MD [address] [place],  [place] [address] PCP: Loreli Elsie JONETTA Mickey., MD  Subjective: Chief Complaint  Patient presents with   Right Knee - Pain    HPI: Catherine Harrington is a 78 y.o. female who returns to the office for follow-up visit.    Plan at last visit was: Right knee gel injection series with last injection 04/09/24  Since then, patient notes that gel injection series did not provide much relief for her like it did last time.  It may have something to do with the fact that her husband had a fall soon after her last gel injection and she has had to do a lot of increased activity helping to care for him as he recovers.  She describes continued right knee pain and swelling.  All of this movement has also aggravated her right buttock pain that she has previously seen a physical therapist for and dry needling typically helps this.              ROS: All systems reviewed are negative as they relate to the chief complaint within the history of present illness.  Patient denies fevers or chills.  Assessment & Plan: Visit Diagnoses:  1. Arthritis of right knee     Plan: Catherine Harrington is a 78 y.o. female who returns to the office for follow-up visit.  Plan from last visit was noted above in HPI.  They now return with no improvement from gel injection series though this could be due to the increased activity since her husband injured himself and she has been doing a lot of activity caring for him.  We discussed options available to patient's.  At this point, gel injections have not helped and cortisone injections only last for about 1 to 2 weeks.  Other options would be continuing with the topical anti-inflammatory and knee brace that she uses versus formal physical therapy (which has helped her buttock pain) versus  PRP injection versus surgical intervention.  After lengthy discussion, patient would like to try physical therapy at [facility] and we will see her back in 4 to 6 weeks for clinical recheck and consideration of PRP injection at that time if she has no improvement of her symptoms.  She will call a week in advance to give us  a heads up if she decides she wants to proceed with PRP injection.  Follow-Up Instructions: No follow-ups on file.   Orders:  No orders of the defined types were placed in this encounter.  No orders of the defined types were placed in this encounter.     Procedures: No procedures performed   Clinical Data: No additional findings.  Objective: Vital Signs: There were no vitals taken for this visit.  Physical Exam:  Constitutional: Patient appears well-developed HEENT:  Head: Normocephalic Eyes:EOM are normal Neck: Normal range of motion Cardiovascular: Normal rate Pulmonary/chest: Effort normal Neurologic: Patient is alert Skin: Skin is warm Psychiatric: Patient has normal mood and affect  Ortho Exam: Ortho exam demonstrates right knee with significant patellofemoral crepitus.  Mild tenderness over the medial and lateral joint lines but maximal tenderness with compression of the patella and the trochlea.  She is able to perform straight leg raise though  this is quite painful for her.  She has extension to about 2 degrees and flexion past 90 degrees.  No cellulitis or skin changes noted.  No calf tenderness.  Negative Homans' sign.  Palpable pedal pulses.  Specialty Comments:  EXAM: MRI LUMBAR SPINE WITHOUT CONTRAST   TECHNIQUE: Multiplanar, multisequence MR imaging of the lumbar spine was performed. No intravenous contrast was administered.   COMPARISON:  05/09/2017   FINDINGS: Segmentation: 5 lumbar type vertebrae based on the available coverage   Alignment: Marked dextroscoliosis with rightward translation at L4-5. The curvature is centered at  the level of L3.   Vertebrae: Degenerative fatty marrow conversion at L2-L5 primarily. No fracture, discitis, or aggressive bone lesion   Conus medullaris and cauda equina: Conus extends to the T12 level. Conus and cauda equina appear normal.   Paraspinal and other soft tissues: Marked muscular atrophy involving the intrinsic back muscles and bilateral psoas. No incidental inflammation or mass is seen about the spine.   Disc levels:   T12- L1: Unremarkable.   L1-L2: Disc collapse and endplate degeneration with ridging. No compressive spinal or foraminal stenosis   L2-L3: Asymmetric leftward disc collapse. Degenerative facet spurring asymmetric to the left. The canal and foramina are patent. The left subarticular recess is effaced.   L3-L4: Disc collapse asymmetric to the left. Intervertebral ankylosis has occurred remotely. Facet spurring asymmetric to the left where there is moderate foraminal narrowing. Left subarticular recess narrowing that could affect the L4 nerve root.   L4-L5: Disc collapse and endplate degeneration. Degenerative facet spurring asymmetric to the right. Advanced right foraminal stenosis. Right subarticular recess narrowing that could certainly affect the L5 nerve root. More moderate left subarticular recess narrowing.   L5-S1:Degenerative facet spurring with small joint effusions. Preserved disc height and hydration compared to the other levels. No neural compression.   IMPRESSION: 1. No acute or interval finding when compared to 2018. 2. Advanced lumbar spine degeneration and dextroscoliosis. 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 3. L4-5 right foraminal and subarticular recess impingement. 4. On the left there is subarticular recess stenosis at L2-3 to L4-5. 5. Advanced and generalized muscular atrophy.     Electronically Signed   By: Cassondra Roulette M.D.   On: 09/12/2020 18:55  Imaging: No results found.   PMFS  History: Patient Active Problem List   Diagnosis Date Noted   Chronic rhinitis 11/03/2023   Chronic maxillary sinusitis 10/05/2023   Hypertrophy of nasal turbinates 10/05/2023   Sensorineural hearing loss, bilateral 10/05/2023   Right-sided tinnitus 10/05/2023   Sesamoiditis of right foot 07/26/2023   Pain due to onychomycosis of toenails of both feet 08/27/2020   Myofascial pain syndrome 03/20/2020   Scoliosis of thoracolumbar spine 02/18/2020   Spinal stenosis of lumbar region without neurogenic claudication 02/18/2020   Porokeratosis 01/16/2020   Fibrosis of liver 04/18/2018   Macrocytic anemia 06/29/2016   Chronic renal insufficiency, stage III (moderate) (HCC) 06/29/2016   Acute right flank pain 06/29/2016   Chronic renal insufficiency 06/29/2016   Hemochromatosis 05/18/2016   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/18/2016   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   Macrocytosis 08/16/2013   Syncope 08/16/2013   Diverticulosis of colon 11/04/2010   Past Medical History:  Diagnosis Date   Acute right flank pain 06/29/2016   Arthritis    related to the lupus; joints (08/16/2013)   Chronic renal insufficiency, stage III (moderate) (HCC) 06/29/2016   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   Family history of anesthesia complication  daughter doesn't wake up easy from it (08/16/2013)   Fibrosis of liver 04/18/2018   Hemochromatosis 05/18/2016   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   Hypertension    Iron deficiency anemia    Lumbar disc disease    Lupus    just the rash and arthritis (08/16/2013)   Macrocytic anemia 06/29/2016   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/18/2016   Scoliosis    Syncope and collapse    lost consciousness ~ 4-5 seconds; didn't hurt myself (08/16/2013)    Family History  Problem Relation Age of Onset   Hypertension Mother    Hypertension Father     Past Surgical History:  Procedure Laterality Date   CATARACT EXTRACTION Bilateral 10/19 11/19    CESAREAN SECTION  1967; 1971   MOUTH SURGERY  2013    infection under bridge cleaned out, etc (08/16/2013)   TUBAL LIGATION     Social History   Occupational History   Occupation: retired  Tobacco Use   Smoking status: Every Day    Current packs/day: 1.00    Average packs/day: 1 pack/day for 48.0 years (48.0 ttl pk-yrs)    Types: Cigarettes   Smokeless tobacco: Never  Substance and Sexual Activity   Alcohol use: Yes    Alcohol/week: 21.0 standard drinks of alcohol    Types: 21 Cans of beer per week    Comment:  3-4 drinks daily sometimes.   Drug use: No   Sexual activity: Not Currently    Partners: Male    Birth control/protection: Post-menopausal

## 2024-08-06 ENCOUNTER — Ambulatory Visit (INDEPENDENT_AMBULATORY_CARE_PROVIDER_SITE_OTHER): Admitting: Podiatry

## 2024-08-06 ENCOUNTER — Encounter: Payer: Self-pay | Admitting: Podiatry

## 2024-08-06 DIAGNOSIS — R7401 Elevation of levels of liver transaminase levels: Secondary | ICD-10-CM | POA: Insufficient documentation

## 2024-08-06 DIAGNOSIS — J432 Centrilobular emphysema: Secondary | ICD-10-CM | POA: Insufficient documentation

## 2024-08-06 DIAGNOSIS — Z0000 Encounter for general adult medical examination without abnormal findings: Secondary | ICD-10-CM | POA: Insufficient documentation

## 2024-08-06 DIAGNOSIS — I6521 Occlusion and stenosis of right carotid artery: Secondary | ICD-10-CM | POA: Insufficient documentation

## 2024-08-06 DIAGNOSIS — M542 Cervicalgia: Secondary | ICD-10-CM | POA: Insufficient documentation

## 2024-08-06 DIAGNOSIS — Q828 Other specified congenital malformations of skin: Secondary | ICD-10-CM | POA: Diagnosis not present

## 2024-08-06 DIAGNOSIS — I700 Atherosclerosis of aorta: Secondary | ICD-10-CM | POA: Insufficient documentation

## 2024-08-06 NOTE — Progress Notes (Signed)
This patient returns to my office for at risk foot care.  This patient requires this care by a professional since this patient will be at risk due to having chronic kidney disease stage  3.  Patient says the callus are painful walking and wearing her shoes.  This patient is uable to trim her callus since she cannot reach her feet due to back problems.  This patient presents for at risk foot care today.  General Appearance  Alert, conversant and in no acute stress.  Vascular  Dorsalis pedis and posterior tibial  pulses are palpable  bilaterally.  Capillary return is within normal limits  bilaterally. Temperature is within normal limits  bilaterally.  Neurologic  Senn-Weinstein monofilament wire test within normal limits  bilaterally. Muscle power within normal limits bilaterally.  Nails Thick disfigured discolored nails with subungual debris  from hallux to fifth toes bilaterally. No evidence of bacterial infection or drainage bilaterally.  Orthopedic  No limitations of motion  feet .  No crepitus or effusions noted.  No bony pathology or digital deformities noted.  Enlarged and painful tibial sesamoid right foot.  Skin  normotropic skin   noted bilaterally.  No signs of infections or ulcers noted.   Porokeratosis sub 1 right foot.  Callus left hallux.  Porokeratosis  B/L.  Sesamoiditis sub 1st MPJ right foot.  Consent was obtained for treatment procedures.   Debridement of calluses both feet with a dremel tool and # 15 blade.   Nails done as a courtesy.   Return office visit    9   weeks                   Told patient to return for periodic foot care and evaluation due to potential at risk complications.   Helane Gunther DPM

## 2024-08-08 ENCOUNTER — Other Ambulatory Visit: Payer: Self-pay

## 2024-08-08 ENCOUNTER — Inpatient Hospital Stay: Attending: Hematology

## 2024-08-08 LAB — CBC WITH DIFFERENTIAL (CANCER CENTER ONLY)
Abs Immature Granulocytes: 0 K/uL (ref 0.00–0.07)
Basophils Absolute: 0 K/uL (ref 0.0–0.1)
Basophils Relative: 0 %
Eosinophils Absolute: 0 K/uL (ref 0.0–0.5)
Eosinophils Relative: 0 %
HCT: 30.2 % — ABNORMAL LOW (ref 36.0–46.0)
Hemoglobin: 10.9 g/dL — ABNORMAL LOW (ref 12.0–15.0)
Immature Granulocytes: 0 %
Lymphocytes Relative: 29 %
Lymphs Abs: 1 K/uL (ref 0.7–4.0)
MCH: 36.2 pg — ABNORMAL HIGH (ref 26.0–34.0)
MCHC: 36.1 g/dL — ABNORMAL HIGH (ref 30.0–36.0)
MCV: 100.3 fL — ABNORMAL HIGH (ref 80.0–100.0)
Monocytes Absolute: 0.3 K/uL (ref 0.1–1.0)
Monocytes Relative: 9 %
Neutro Abs: 2.1 K/uL (ref 1.7–7.7)
Neutrophils Relative %: 62 %
Platelet Count: 141 K/uL — ABNORMAL LOW (ref 150–400)
RBC: 3.01 MIL/uL — ABNORMAL LOW (ref 3.87–5.11)
RDW: 12.7 % (ref 11.5–15.5)
WBC Count: 3.5 K/uL — ABNORMAL LOW (ref 4.0–10.5)
nRBC: 0 % (ref 0.0–0.2)

## 2024-08-08 LAB — CMP (CANCER CENTER ONLY)
ALT: 57 U/L — ABNORMAL HIGH (ref 0–44)
AST: 87 U/L — ABNORMAL HIGH (ref 15–41)
Albumin: 4.4 g/dL (ref 3.5–5.0)
Alkaline Phosphatase: 75 U/L (ref 38–126)
Anion gap: 9 (ref 5–15)
BUN: 18 mg/dL (ref 8–23)
CO2: 23 mmol/L (ref 22–32)
Calcium: 9.8 mg/dL (ref 8.9–10.3)
Chloride: 101 mmol/L (ref 98–111)
Creatinine: 1.07 mg/dL — ABNORMAL HIGH (ref 0.44–1.00)
GFR, Estimated: 53 mL/min — ABNORMAL LOW (ref >60)
Glucose, Bld: 88 mg/dL (ref 70–99)
Potassium: 4.6 mmol/L (ref 3.5–5.1)
Sodium: 133 mmol/L — ABNORMAL LOW (ref 135–145)
Total Bilirubin: 0.6 mg/dL (ref 0.0–1.2)
Total Protein: 7.8 g/dL (ref 6.5–8.1)

## 2024-08-08 LAB — IRON AND IRON BINDING CAPACITY (CC-WL,HP ONLY)
Iron: 127 ug/dL (ref 28–170)
Saturation Ratios: 45 % — ABNORMAL HIGH (ref 10.4–31.8)
TIBC: 280 ug/dL (ref 250–450)
UIBC: 153 ug/dL (ref 148–442)

## 2024-08-09 LAB — FERRITIN: Ferritin: 393 ng/mL — ABNORMAL HIGH (ref 11–307)

## 2024-08-13 ENCOUNTER — Other Ambulatory Visit (INDEPENDENT_AMBULATORY_CARE_PROVIDER_SITE_OTHER): Payer: Self-pay | Admitting: Otolaryngology

## 2024-08-13 MED ORDER — FLUTICASONE PROPIONATE 50 MCG/ACT NA SUSP: 2.0000 | Freq: Every day | NASAL | 10 refills | Status: AC

## 2024-09-23 NOTE — Progress Notes (Unsigned)
 Office Visit Note  Patient: Catherine Harrington             Date of Birth: May 19, 1946           MRN: [id number]             PCP: Loreli Elsie JONETTA Mickey., MD Referring: Shirly Carlin LITTIE DEVONNA Visit Date: 09/24/2024 Occupation: Data Unavailable  Subjective:  No chief complaint on file.   History of Present Illness: Catherine Harrington is a 78 y.o. female ***     Activities of Daily Living:  Patient reports morning stiffness for *** {minute/hour:19697}.   Patient {ACTIONS;DENIES/REPORTS:21021675::Denies} nocturnal pain.  Difficulty dressing/grooming: {ACTIONS;DENIES/REPORTS:21021675::Denies} Difficulty climbing stairs: {ACTIONS;DENIES/REPORTS:21021675::Denies} Difficulty getting out of chair: {ACTIONS;DENIES/REPORTS:21021675::Denies} Difficulty using hands for taps, buttons, cutlery, and/or writing: {ACTIONS;DENIES/REPORTS:21021675::Denies}  No Rheumatology ROS completed.   PMFS History:  Patient Active Problem List   Diagnosis Date Noted   Centrilobular emphysema (HCC) 08/06/2024   Elevated liver transaminase level 08/06/2024   Hardening of the aorta (main artery of the heart) 08/06/2024   Neck pain 08/06/2024   Preventative health care 08/06/2024   Stenosis of right carotid artery 08/06/2024   Chronic rhinitis 11/03/2023   Chronic maxillary sinusitis 10/05/2023   Hypertrophy of nasal turbinates 10/05/2023   Sensorineural hearing loss, bilateral 10/05/2023   Right-sided tinnitus 10/05/2023   Sesamoiditis of right foot 07/26/2023   Pain due to onychomycosis of toenails of both feet 08/27/2020   Myofascial pain syndrome 03/20/2020   Alcohol abuse 03/17/2020   Hyperlipidemia 03/17/2020   Cyst of pancreas 02/27/2020   Scoliosis of thoracolumbar spine 02/18/2020   Spinal stenosis of lumbar region without neurogenic claudication 02/18/2020   Peripheral vascular disease 02/17/2020   Osteoarthritis 02/06/2020   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 02/06/2020   Porokeratosis 01/16/2020   Diarrhea 12/12/2019   Essential hypertension 12/12/2019   Hypothyroidism 12/12/2019   Lumbar radiculopathy 12/12/2019   Tobacco user 12/12/2019   Fibrosis of liver 04/18/2018   Macrocytic anemia 06/29/2016   Chronic renal insufficiency, stage III (moderate) 06/29/2016   Acute right flank pain 06/29/2016   Chronic renal insufficiency 06/29/2016   Hemochromatosis 05/18/2016   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/18/2016   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   Macrocytosis 08/16/2013   Syncope 08/16/2013   Diverticulosis of colon 11/04/2010    Past Medical History:  Diagnosis Date   Acute right flank pain 06/29/2016   Arthritis    related to the lupus; joints (08/16/2013)   Chronic renal insufficiency, stage III (moderate) 06/29/2016   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   Family history of anesthesia complication    daughter doesn't wake up easy from it (08/16/2013)   Fibrosis of liver 04/18/2018   Hemochromatosis 05/18/2016   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   Hypertension    Iron deficiency anemia    Lumbar disc disease    Lupus    just the rash and arthritis (08/16/2013)   Macrocytic anemia 06/29/2016   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/18/2016   Scoliosis    Syncope and collapse    lost consciousness ~ 4-5 seconds; didn't hurt myself (08/16/2013)    Family History  Problem Relation Age of Onset   Hypertension Mother    Hypertension Father    Past Surgical History:  Procedure Laterality Date   CATARACT EXTRACTION Bilateral 10/19 11/19   CESAREAN SECTION  1967; 1971   MOUTH SURGERY  2013    infection under bridge cleaned out, etc (08/16/2013)   TUBAL LIGATION  Social History   Tobacco Use   Smoking status: Every Day    Current packs/day: 1.00    Average packs/day: 1 pack/day for 48.0 years (48.0 ttl pk-yrs)    Types: Cigarettes   Smokeless tobacco: Never  Substance Use Topics   Alcohol use: Yes     Alcohol/week: 21.0 standard drinks of alcohol    Types: 21 Cans of beer per week    Comment:  3-4 drinks daily sometimes.   Drug use: No   Social History   Social History Narrative   Lives with husband   Drinks caffeine 3 cups a day     Immunization History  Administered Date(s) Administered   Fluad Quad(high Dose 65+) 08/03/2019   Fluzone Influenza virus vaccine,trivalent (IIV3), split virus 08/07/2021   Hepatitis A, Adult 12/17/2011, 05/22/2012   Influenza,inj,Quad PF,6+ Mos 09/10/2014, 08/26/2017, 08/29/2018   Influenza-Unspecified 08/06/2013   PFIZER(Purple Top)SARS-COV-2 Vaccination 01/03/2020, 01/29/2020, 08/22/2020, 08/07/2021   Pneumococcal Polysaccharide-23 05/03/2023   Pneumococcal-Unspecified 07/30/2010   Zoster Recombinant(Shingrix) 03/31/2018, 06/02/2018     Objective: Vital Signs: There were no vitals taken for this visit.   Physical Exam   Musculoskeletal Exam: ***  CDAI Exam: CDAI Score: -- Patient Global: --; Provider Global: -- Swollen: --; Tender: -- Joint Exam 09/24/2024   No joint exam has been documented for this visit   There is currently no information documented on the homunculus. Go to the Rheumatology activity and complete the homunculus joint exam.  Investigation: No additional findings.  Imaging: No results found.  Recent Labs: Lab Results  Component Value Date   WBC 3.5 (L) 08/08/2024   HGB 10.9 (L) 08/08/2024   PLT 141 (L) 08/08/2024   NA 133 (L) 08/08/2024   K 4.6 08/08/2024   CL 101 08/08/2024   CO2 23 08/08/2024   GLUCOSE 88 08/08/2024   BUN 18 08/08/2024   CREATININE 1.07 (H) 08/08/2024   BILITOT 0.6 08/08/2024   ALKPHOS 75 08/08/2024   AST 87 (H) 08/08/2024   ALT 57 (H) 08/08/2024   PROT 7.8 08/08/2024   ALBUMIN 4.4 08/08/2024   CALCIUM  9.8 08/08/2024   GFRAA 56 (L) 05/24/2019    Speciality Comments: No specialty comments available.  Procedures:  No procedures performed Allergies: Prednisone,  Sulfonamide derivatives, Epinephrine, and Midol [ibuprofen]   Assessment / Plan:     Visit Diagnoses: No diagnosis found.  Orders: No orders of the defined types were placed in this encounter.  No orders of the defined types were placed in this encounter.   Face-to-face time spent with patient was *** minutes. Greater than 50% of time was spent in counseling and coordination of care.  Follow-Up Instructions: No follow-ups on file.   Lonni LELON Ester, MD  Note - This record has been created using [employer].  Chart creation errors have been sought, but may not always  have been located. Such creation errors do not reflect on  the standard of medical care.

## 2024-09-24 ENCOUNTER — Ambulatory Visit

## 2024-09-24 ENCOUNTER — Ambulatory Visit: Attending: Internal Medicine | Admitting: Internal Medicine

## 2024-09-24 ENCOUNTER — Encounter: Payer: Self-pay | Admitting: Internal Medicine

## 2024-09-24 VITALS — BP 132/67 | HR 81 | Temp 98.3°F | Resp 18 | Ht 61.0 in | Wt 90.4 lb

## 2024-09-24 DIAGNOSIS — M1380 Other specified arthritis, unspecified site: Secondary | ICD-10-CM | POA: Insufficient documentation

## 2024-09-24 DIAGNOSIS — M79642 Pain in left hand: Secondary | ICD-10-CM | POA: Diagnosis not present

## 2024-09-24 DIAGNOSIS — M7918 Myalgia, other site: Secondary | ICD-10-CM | POA: Diagnosis not present

## 2024-09-24 DIAGNOSIS — R7401 Elevation of levels of liver transaminase levels: Secondary | ICD-10-CM | POA: Diagnosis present

## 2024-09-24 DIAGNOSIS — M79641 Pain in right hand: Secondary | ICD-10-CM | POA: Diagnosis not present

## 2024-09-24 DIAGNOSIS — N2889 Other specified disorders of kidney and ureter: Secondary | ICD-10-CM | POA: Insufficient documentation

## 2024-09-24 DIAGNOSIS — M159 Polyosteoarthritis, unspecified: Secondary | ICD-10-CM | POA: Diagnosis present

## 2024-09-24 DIAGNOSIS — L930 Discoid lupus erythematosus: Secondary | ICD-10-CM | POA: Insufficient documentation

## 2024-09-26 LAB — ANA,IFA RA DIAG PNL W/RFLX TIT/PATN
Anti Nuclear Antibody (ANA): POSITIVE — AB
Cyclic Citrullin Peptide Ab: <16 U
Rheumatoid fact SerPl-aCnc: <10 [IU]/mL (ref <14)

## 2024-09-26 LAB — C3 AND C4
C3 Complement: 92 mg/dL (ref 83–193)
C4 Complement: 16 mg/dL (ref 15–57)

## 2024-09-26 LAB — ANTI-NUCLEAR AB-TITER (ANA TITER): ANA Titer 1: 1:40 {titer} — ABNORMAL HIGH

## 2024-09-26 LAB — SEDIMENTATION RATE: Sed Rate: 31 mm/h — ABNORMAL HIGH (ref 0–30)

## 2024-09-26 LAB — C-REACTIVE PROTEIN: CRP: <3 mg/L

## 2024-10-01 ENCOUNTER — Encounter: Payer: Self-pay | Admitting: Radiology

## 2024-10-08 ENCOUNTER — Ambulatory Visit (INDEPENDENT_AMBULATORY_CARE_PROVIDER_SITE_OTHER): Admitting: Podiatry

## 2024-10-08 ENCOUNTER — Encounter: Payer: Self-pay | Admitting: Podiatry

## 2024-10-08 DIAGNOSIS — L84 Corns and callosities: Secondary | ICD-10-CM | POA: Diagnosis not present

## 2024-10-08 DIAGNOSIS — Q828 Other specified congenital malformations of skin: Secondary | ICD-10-CM | POA: Diagnosis not present

## 2024-10-08 DIAGNOSIS — M216X1 Other acquired deformities of right foot: Secondary | ICD-10-CM

## 2024-10-08 NOTE — Progress Notes (Signed)
This patient returns to my office for at risk foot care.  This patient requires this care by a professional since this patient will be at risk due to having chronic kidney disease stage  3.  Patient says the callus are painful walking and wearing her shoes.  This patient is uable to trim her callus since she cannot reach her feet due to back problems.  This patient presents for at risk foot care today.  General Appearance  Alert, conversant and in no acute stress.  Vascular  Dorsalis pedis and posterior tibial  pulses are palpable  bilaterally.  Capillary return is within normal limits  bilaterally. Temperature is within normal limits  bilaterally.  Neurologic  Senn-Weinstein monofilament wire test within normal limits  bilaterally. Muscle power within normal limits bilaterally.  Nails Thick disfigured discolored nails with subungual debris  from hallux to fifth toes bilaterally. No evidence of bacterial infection or drainage bilaterally.  Orthopedic  No limitations of motion  feet .  No crepitus or effusions noted.  No bony pathology or digital deformities noted.  Enlarged and painful tibial sesamoid right foot.  Skin  normotropic skin   noted bilaterally.  No signs of infections or ulcers noted.   Porokeratosis sub 1 right foot.  Callus left hallux.  Porokeratosis  B/L.  Sesamoiditis sub 1st MPJ right foot.  Consent was obtained for treatment procedures.   Debridement of calluses both feet with a dremel tool and # 15 blade.   Nails done as a courtesy.   Return office visit    9   weeks                   Told patient to return for periodic foot care and evaluation due to potential at risk complications.   Helane Gunther DPM

## 2024-10-09 ENCOUNTER — Telehealth: Payer: Self-pay | Admitting: *Deleted

## 2024-10-09 NOTE — Telephone Encounter (Signed)
 Patient would like to know if ou have any information on the radiation for arthritis on hands.

## 2024-10-12 ENCOUNTER — Ambulatory Visit: Payer: Self-pay | Admitting: Internal Medicine

## 2024-10-12 NOTE — Progress Notes (Signed)
 And test were negative for rheumatoid arthritis.  She had a low positive ANA at 1:40 but I do not think she has lupus.  Sedimentation rate was slightly elevated at 31 which can indicate ongoing inflammation.  X-ray of her hand shows some wear-and-tear related changes but the amount of joint deformity near the tip of her fingers is very pronounced compared to the overall wear and tear.  I recommend we try starting on hydroxychloroquine 200 mg once daily for inflammatory arthritis we should see by our scheduled follow-up in January whether this medicine is improving her symptoms a lot and could also recheck her inflammation numbers at that time.  At this time I do not know if she is a candidate for the low-dose radiation therapy for osteoarthritis.  We have an upcoming meeting with the [facility] health radiology service so I may have more knowledge about if she is a candidate for not in a couple weeks.

## 2024-10-12 NOTE — Telephone Encounter (Signed)
 Now addressed in associated result note

## 2024-11-12 ENCOUNTER — Ambulatory Visit: Admitting: Surgical

## 2024-12-02 NOTE — Progress Notes (Signed)
 "  Office Visit Note  Patient: Catherine Harrington             Date of Birth: 01/04/1946           MRN: [id number]             PCP: Loreli Elsie JONETTA Mickey., MD Referring: Loreli Elsie JONETTA Mickey., MD Visit Date: 12/03/2024   Subjective:  Discussed the use of AI scribe software for clinical note transcription with the patient, who gave verbal consent to proceed.  History of Present Illness   Catherine Harrington is a 79 year old female with osteoarthritis who presents for consideration of radiation therapy for knee pain.  She has significant knee pain due to osteoarthritis, with extensive arthritis noted in her knees. She has previously tried treatments such as gel injections, but is unsure of their effectiveness due to concurrent personal issues. She is not interested in joint replacement surgery at this time.  She experiences pain and lack of strength in her hands, making it difficult to hold objects or perform tasks. She previously tried Plaquenil, but it was not effective and she is concerned about side effects.  Her current medications include NSAIDs like ibuprofen and Aleve, but she prefers not to take pills regularly. She has not recently taken steroids or received steroid injections.   Previous HPI 09/24/24 Catherine Harrington is a 79 year old female with arthritis and history of lupus who presents with worsening hand pain and swelling. She was referred by her orthopedics doctors for evaluation of arthritis given history of autoimmune disease.   She experiences significant difficulty using her right hand, which is her dominant hand, due to pain and swelling. The pain is described as throbbing and has worsened over time, particularly as she has been using her hand more to assist her husband, who recently fell and was diagnosed with neuropathy. No recent imaging of her hands has been done, but she notes significant swelling in several joints.   She occasionally takes Tylenol  for pain relief but limits  its use due to a history of anemia and concerns about liver health. She has been anemic for over fifty years and was previously taking iron supplements until her liver was noted to be enlarged, leading to a diagnosis of hemochromatosis. She underwent phlebotomy to reduce iron levels, which helped, but she remains anemic.   She has a history of lupus diagnosed years ago at [facility], with a family history of similar conditions, including her sister who had unclassified collagen and rheumatoid myositis. She recalls being told she had the same condition as her sister. She was previously prescribed hydroxychloroquine for lupus, which required regular eye exams due to potential retinal side effects, but she discontinued it as it did not help her symptoms from what she can recall.   She experiences swelling in her legs, particularly when wearing socks or a knee brace, and has tried diabetic socks to manage this. She has previously used Voltaren gel for her hands, which provides minimal relief. She has also experienced adverse reactions to oral prednisone, including difficulty speaking, but tolerates steroid injections in her back.   She is frustrated with past medical consultations that did not provide helpful interventions for her hand issues. She had seen [place] rheum PA multiple times without much done.   Review of Systems  Constitutional:  Positive for fatigue.  HENT:  Negative for mouth sores and mouth dryness.   Eyes:  Negative for dryness.  Respiratory:  Negative for shortness of breath.   Cardiovascular:  Negative for chest pain and palpitations.  Gastrointestinal:  Positive for diarrhea. Negative for blood in stool and constipation.  Endocrine: Negative for increased urination.  Genitourinary:  Positive for involuntary urination.  Musculoskeletal:  Positive for joint pain, gait problem, joint pain, joint swelling, myalgias, morning stiffness and myalgias. Negative for muscle weakness and  muscle tenderness.  Skin:  Negative for color change, rash, hair loss and sensitivity to sunlight.  Allergic/Immunologic: Negative for susceptible to infections.  Neurological:  Negative for dizziness and headaches.  Hematological:  Negative for swollen glands.  Psychiatric/Behavioral:  Negative for depressed mood and sleep disturbance. The patient is not nervous/anxious.     PMFS History:  Patient Active Problem List   Diagnosis Date Noted   Bilateral primary osteoarthritis of knee 12/03/2024   Osteoarthritis of hands, bilateral 12/03/2024   Inflammatory arthritis 09/24/2024   Centrilobular emphysema (HCC) 08/06/2024   Elevated liver transaminase level 08/06/2024   Hardening of the aorta (main artery of the heart) 08/06/2024   Neck pain 08/06/2024   Preventative health care 08/06/2024   Stenosis of right carotid artery 08/06/2024   Chronic rhinitis 11/03/2023   Chronic maxillary sinusitis 10/05/2023   Hypertrophy of nasal turbinates 10/05/2023   Sensorineural hearing loss, bilateral 10/05/2023   Right-sided tinnitus 10/05/2023   Sesamoiditis of right foot 07/26/2023   Pain due to onychomycosis of toenails of both feet 08/27/2020   Myofascial pain syndrome 03/20/2020   Alcohol abuse 03/17/2020   Hyperlipidemia 03/17/2020   Cyst of pancreas 02/27/2020   Scoliosis of thoracolumbar spine 02/18/2020   Spinal stenosis of lumbar region without neurogenic claudication 02/18/2020   Peripheral vascular disease 02/17/2020   Osteoarthritis 02/06/2020   Varicose veins of unspecified lower extremity with inflammation 02/06/2020   Porokeratosis 01/16/2020   Diarrhea 12/12/2019   Essential hypertension 12/12/2019   Hypothyroidism 12/12/2019   Lumbar radiculopathy 12/12/2019   Tobacco user 12/12/2019   Fibrosis of liver 04/18/2018   Macrocytic anemia 06/29/2016   Chronic renal insufficiency, stage III (moderate) 06/29/2016   Acute right flank pain 06/29/2016   Chronic renal  insufficiency 06/29/2016   Hemochromatosis 05/18/2016   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/18/2016   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   Macrocytosis 08/16/2013   Syncope 08/16/2013   Diverticulosis of colon 11/04/2010    Past Medical History:  Diagnosis Date   Acute right flank pain 06/29/2016   Arthritis    related to the lupus; joints (08/16/2013)   Chronic renal insufficiency, stage III (moderate) 06/29/2016   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   Family history of anesthesia complication    daughter doesn't wake up easy from it (08/16/2013)   Fibrosis of liver 04/18/2018   Hemochromatosis 05/18/2016   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   Hypertension    Iron deficiency anemia    Lumbar disc disease    Lupus    just the rash and arthritis (08/16/2013)   Macrocytic anemia 06/29/2016   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/18/2016   Scoliosis    Syncope and collapse    lost consciousness ~ 4-5 seconds; didn't hurt myself (08/16/2013)    Family History  Problem Relation Age of Onset   Hypertension Mother    Hypertension Father    Blindness Brother    Healthy Daughter    Healthy Daughter    Past Surgical History:  Procedure Laterality Date   CATARACT EXTRACTION Bilateral 10/19 11/19   CESAREAN SECTION  1967; 1971   MOUTH SURGERY  2013    infection under bridge cleaned out, etc (08/16/2013)   TUBAL LIGATION     Social History   Social History Narrative   Lives with husband   Drinks caffeine 3 cups a day   Immunization History  Administered Date(s) Administered   Fluad Quad(high Dose 65+) 08/03/2019   Fluzone Influenza virus vaccine,trivalent (IIV3), split virus 08/07/2021   Hepatitis A, Adult 12/17/2011, 05/22/2012   Influenza,inj,Quad PF,6+ Mos 09/10/2014, 08/26/2017, 08/29/2018   Influenza-Unspecified 08/06/2013   PFIZER(Purple Top)SARS-COV-2 Vaccination 01/03/2020, 01/29/2020, 08/22/2020, 08/07/2021   Pneumococcal Polysaccharide-23 05/03/2023    Pneumococcal-Unspecified 07/30/2010   Zoster Recombinant(Shingrix) 03/31/2018, 06/02/2018     Objective: Vital Signs: BP (!) 143/54   Pulse 68   Temp 98.4 F (36.9 C)   Resp 16   Ht 5' 1 (1.549 m)   Wt 90 lb 9.6 oz (41.1 kg)   BMI 17.12 kg/m    Physical Exam Eyes:     Conjunctiva/sclera: Conjunctivae normal.  Cardiovascular:     Rate and Rhythm: Normal rate and regular rhythm.  Pulmonary:     Effort: Pulmonary effort is normal.     Breath sounds: Normal breath sounds.  Skin:    General: Skin is warm and dry.  Neurological:     Mental Status: She is alert.  Psychiatric:        Mood and Affect: Mood normal.      Musculoskeletal Exam:  Shoulders full ROM no tenderness or swelling Elbows full ROM no tenderness or swelling Wrists full ROM no tenderness or swelling Right third MCP and PIP, fourth PIP, and fifth PIP joints with palpable synovitis. Cyst on dorsum of right 3rd PIP on right hand joint, lateral deviations and bony widening throughout PIP and DIPs Knees painful with full extension and flexion, no effusion, crepitus Ankles full ROM no tenderness or swelling  Investigation: No additional findings.  Imaging: No results found.  Recent Labs: Lab Results  Component Value Date   WBC 3.5 (L) 08/08/2024   HGB 10.9 (L) 08/08/2024   PLT 141 (L) 08/08/2024   NA 133 (L) 08/08/2024   K 4.6 08/08/2024   CL 101 08/08/2024   CO2 23 08/08/2024   GLUCOSE 88 08/08/2024   BUN 18 08/08/2024   CREATININE 1.07 (H) 08/08/2024   BILITOT 0.6 08/08/2024   ALKPHOS 75 08/08/2024   AST 87 (H) 08/08/2024   ALT 57 (H) 08/08/2024   PROT 7.8 08/08/2024   ALBUMIN 4.4 08/08/2024   CALCIUM  9.8 08/08/2024   GFRAA 56 (L) 05/24/2019    Speciality Comments: No specialty comments available.  Procedures:  No procedures performed Allergies: Prednisone, Sulfonamide derivatives, Epinephrine, and Midol [ibuprofen]   Assessment / Plan:     Visit Diagnoses: Discoid lupus  erythematosus No obvious disease activity with no flares of skin or mucosal inflammation off any maintenance. Lab testing unremarkable for systemic inflammation.   Bilateral knee osteoarthritis Significant arthritis. Previous gel injections with uncertain efficacy. Not a candidate for joint replacement due to age and risks and patient preference. Radiation therapy considered to manage symptoms without daily oral medications or narcotics. - Referred to radiation oncology for low-dose radiation therapy.  Bilateral hand osteoarthritis Significant pain and weakness in fourth and fifth fingers. Previous steroid injections. Not interested in Plaquenil due to past ineffectiveness and side effects. Radiation therapy considered, prioritized for knees first.    Orders: No orders of the defined types were placed in this encounter.  No orders of the defined types were placed in  this encounter.    Follow-Up Instructions: Return in about 6 months (around 06/02/2025) for OA/DLS LDRT referral.   Lonni LELON Ester, MD  Note - This record has been created using Dragon software.  Chart creation errors have been sought, but may not always  have been located. Such creation errors do not reflect on  the standard of medical care. "

## 2024-12-03 ENCOUNTER — Ambulatory Visit: Attending: Internal Medicine | Admitting: Internal Medicine

## 2024-12-03 ENCOUNTER — Encounter: Payer: Self-pay | Admitting: Internal Medicine

## 2024-12-03 VITALS — BP 143/54 | HR 68 | Temp 98.4°F | Resp 16 | Ht 61.0 in | Wt 90.6 lb

## 2024-12-03 DIAGNOSIS — L930 Discoid lupus erythematosus: Secondary | ICD-10-CM | POA: Insufficient documentation

## 2024-12-03 DIAGNOSIS — M19042 Primary osteoarthritis, left hand: Secondary | ICD-10-CM | POA: Diagnosis present

## 2024-12-03 DIAGNOSIS — M19041 Primary osteoarthritis, right hand: Secondary | ICD-10-CM | POA: Insufficient documentation

## 2024-12-03 DIAGNOSIS — M1380 Other specified arthritis, unspecified site: Secondary | ICD-10-CM | POA: Insufficient documentation

## 2024-12-03 DIAGNOSIS — M170 Bilateral primary osteoarthritis of knee: Secondary | ICD-10-CM | POA: Diagnosis present

## 2024-12-10 ENCOUNTER — Ambulatory Visit: Admitting: Podiatry

## 2024-12-10 ENCOUNTER — Encounter: Payer: Self-pay | Admitting: Podiatry

## 2024-12-10 DIAGNOSIS — Q828 Other specified congenital malformations of skin: Secondary | ICD-10-CM | POA: Diagnosis not present

## 2024-12-10 DIAGNOSIS — N2889 Other specified disorders of kidney and ureter: Secondary | ICD-10-CM

## 2024-12-10 DIAGNOSIS — M216X1 Other acquired deformities of right foot: Secondary | ICD-10-CM

## 2024-12-10 NOTE — Progress Notes (Signed)
This patient returns to my office for at risk foot care.  This patient requires this care by a professional since this patient will be at risk due to having chronic kidney disease stage  3.  Patient says the callus are painful walking and wearing her shoes.  This patient is uable to trim her callus since she cannot reach her feet due to back problems.  This patient presents for at risk foot care today.  General Appearance  Alert, conversant and in no acute stress.  Vascular  Dorsalis pedis and posterior tibial  pulses are palpable  bilaterally.  Capillary return is within normal limits  bilaterally. Temperature is within normal limits  bilaterally.  Neurologic  Senn-Weinstein monofilament wire test within normal limits  bilaterally. Muscle power within normal limits bilaterally.  Nails Thick disfigured discolored nails with subungual debris  from hallux to fifth toes bilaterally. No evidence of bacterial infection or drainage bilaterally.  Orthopedic  No limitations of motion  feet .  No crepitus or effusions noted.  No bony pathology or digital deformities noted.  Enlarged and painful tibial sesamoid right foot.  Skin  normotropic skin   noted bilaterally.  No signs of infections or ulcers noted.   Porokeratosis sub 1 right foot.  Callus left hallux.  Porokeratosis  B/L.  Sesamoiditis sub 1st MPJ right foot.  Consent was obtained for treatment procedures.   Debridement of calluses both feet with a dremel tool and # 15 blade.   Nails done as a courtesy.   Return office visit    9   weeks                   Told patient to return for periodic foot care and evaluation due to potential at risk complications.   Helane Gunther DPM

## 2024-12-14 ENCOUNTER — Telehealth: Payer: Self-pay | Admitting: *Deleted

## 2024-12-14 DIAGNOSIS — M170 Bilateral primary osteoarthritis of knee: Secondary | ICD-10-CM

## 2024-12-14 NOTE — Telephone Encounter (Signed)
 Patient contacted the office to see if we had checked her hemoglobin with her last labs. Patient advised we did not. Patient also asked about the referral for the radiation for arthritis.  Was patient supposed to have referral placed?

## 2024-12-25 NOTE — Telephone Encounter (Signed)
 Reached out to patient to advised that her referral has been placed today. Patient advised to give them a chance to receive the referral and reach out to her to schedule. Patient expressed understanding.

## 2024-12-25 NOTE — Addendum Note (Signed)
 Addended by: CENA ALFONSO CROME on: 12/25/2024 12:36 PM   Modules accepted: Orders

## 2024-12-28 ENCOUNTER — Telehealth: Payer: Self-pay | Admitting: Radiation Oncology

## 2024-12-28 NOTE — Telephone Encounter (Signed)
 1/30 @ 8:28 am Called both patient's contact numbers, left message for patient to call our office to be sch for consult.

## 2025-01-02 NOTE — Progress Notes (Incomplete)
 " Radiation Oncology         [phone] ________________________________  Name: Catherine Harrington        MRN: [id number]  Date of Service: 01/04/2025 DOB: [birth year]  RR:Dyjt, Elsie JONETTA Raddle., MD  Jeannetta Lonni ORN, MD     REFERRING PHYSICIAN: Jeannetta Lonni ORN, MD   DIAGNOSIS: The primary encounter diagnosis was Bilateral primary osteoarthritis of knee. A diagnosis of Primary osteoarthritis of both hands was also pertinent to this visit. M17.0, M19.041, M19.042    HISTORY OF PRESENT ILLNESS: Catherine Harrington is a 79 y.o. female seen in consultation for radiation therapy.  She has a long-standing hx of bilateral knee and hand osteoarthritis and reports chronic, progressive pain with stiffness and decreased functional mobility and difficulty with activities of daily living. She has extensive degenerative changes noted in her knees and hands and has trialed conservative therapies including tylenol , NSAIDs, voltaren gel, bracing, physical therapy and viscosupplementation injections with limited or unclear benefit. She prefers to avoid regular oral medications and is not interested in joint replacement surgery at this time.   A PT eval performed in August 2025 documented decreased pain-free R knee active and passive ROM, reduced RLE strength, crepitus with pain, antalgia and decreased functional abilities based on functional outcome measures. Despite compliance with a prescribed home exercise program and good rehabilitation potential, she continued to experience persistent pain and functional limitation. No contraindications to therapy were identified.  She has not received recent intra-articular steroid injections or systemic corticosteroids for her knees. Given persistent symptomatic knee osteoarthritis refractory to conservative management and her desire to avoid surgical intervention, she presents to discuss the potential role of LDRT for pain relief and functional improvement.  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   Hypertension    Iron deficiency anemia    Lumbar disc disease    Lupus    just the rash and arthritis (08/16/2013)   Macrocytic anemia 06/29/2016   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/18/2016   Scoliosis    Syncope and collapse    lost consciousness ~ 4-5 seconds; didn't hurt myself (08/16/2013)       PAST SURGICAL HISTORY: Past Surgical History:  Procedure Laterality Date   CATARACT EXTRACTION Bilateral 10/19 11/19   CESAREAN SECTION  1967; 1971   MOUTH SURGERY  2013    infection under bridge cleaned out, etc (08/16/2013)   TUBAL LIGATION       FAMILY HISTORY:  Family History  Problem Relation Age of Onset   Hypertension Mother    Hypertension Father    Osteoarthritis Sister    Blindness Brother    Healthy Daughter    Healthy Daughter    Osteoarthritis Other      SOCIAL HISTORY:  reports that she has been smoking cigarettes. She has a 48 pack-year smoking history. She has been exposed to tobacco smoke. She has never used smokeless  tobacco. She reports current alcohol use of about 21.0 standard drinks of alcohol per week. She reports that she does not use drugs.   ALLERGIES: Prednisone, Sulfonamide derivatives, Epinephrine, and Midol [ibuprofen]   MEDICATIONS:  Current Outpatient Medications  Medication Sig Dispense Refill   acetaminophen -codeine  (TYLENOL  #3) 300-30 MG  tablet Take 1 tablet by mouth every 8 (eight) hours as needed for moderate pain. 15 tablet 0   baclofen  (LIORESAL ) 10 MG tablet 1/2 tablet po q 8 hours prn (Patient not taking: Reported on 12/03/2024) 30 each 0   calcium  carbonate (OS-CAL) 600 MG TABS Take 600 mg by mouth 2 (two) times daily with a meal.     CREON 24000-76000 units CPEP  (Patient not taking: Reported on 12/03/2024)     dexamethasone 0.5 MG/5ML elixir SWISH 5 MLS IN THE MOUTH FOR 3 MINUTES THEN SPIT THREE TIMES DAILY FOR 3 WEEKS     diazepam  (VALIUM ) 5 MG tablet Take one tablet by mouth with food one hour prior to procedure. May repeat 30 minutes prior if needed. (Patient not taking: Reported on 12/03/2024) 2 tablet 0   diazepam  (VALIUM ) 5 MG tablet Take 1 tablet (5 mg total) by mouth every 12 (twelve) hours as needed for anxiety. (Patient not taking: Reported on 12/03/2024) 4 tablet 0   doxazosin  (CARDURA ) 8 MG tablet Take 8 mg by mouth at bedtime. (Patient not taking: Reported on 12/03/2024)     doxycycline  (VIBRAMYCIN ) 100 MG capsule Take 1 capsule (100 mg total) by mouth 2 (two) times daily. (Patient not taking: Reported on 12/03/2024) 20 capsule 0   fexofenadine (ALLEGRA) 180 MG tablet Take 180 mg by mouth daily. (Patient not taking: Reported on 12/03/2024)     fluticasone  (FLONASE ) 50 MCG/ACT nasal spray Place 1 spray into both nostrils daily. (Patient taking differently: Place 1 spray into both nostrils as needed.)     fluticasone  (FLONASE ) 50 MCG/ACT nasal spray Place 2 sprays into both nostrils daily. (Patient not taking: Reported on 12/03/2024) 16 g 10   gabapentin  (NEURONTIN ) 300 MG capsule TAKE 1 CAPSULE(300 MG) BY MOUTH AT BEDTIME (Patient not taking: Reported on 12/03/2024) 90 capsule 3   Ginger, Zingiber officinalis, (GINGER PO) Take by mouth.     hydrOXYzine (ATARAX/VISTARIL) 10 MG tablet Take by mouth. (Patient not taking: Reported on 12/03/2024)     levothyroxine (SYNTHROID, LEVOTHROID) 50 MCG tablet Take 50 mcg by mouth daily before  breakfast.     lidocaine  (XYLOCAINE ) 2 % solution SMARTSIG:3-5 Milliliter(s) By Mouth Every 4-6 Hours PRN (Patient not taking: Reported on 12/03/2024)     lidocaine -prilocaine  (EMLA ) cream Apply 1 application topically as needed. (Patient not taking: Reported on 12/03/2024) 30 g 0   LORazepam  (ATIVAN ) 0.5 MG tablet Take 1 tablet prior to phlebotomy (Patient not taking: Reported on 12/03/2024) 2 tablet 0   losartan  (COZAAR ) 100 MG tablet Take 100 mg by mouth daily.     meloxicam  (MOBIC ) 7.5 MG tablet Take 1 tablet (7.5 mg total) by mouth daily as needed for pain. 30 tablet 1   Multiple Vitamin (MULTIVITAMIN) tablet Take 1 tablet by mouth daily.     Potassium Gluconate 595 MG CAPS Take by mouth.     rosuvastatin (CRESTOR) 10 MG tablet Take 10 mg by mouth at bedtime.     vitamin B-12 (CYANOCOBALAMIN) 1000 MCG tablet Take 1,000 mcg by mouth daily.     No current facility-administered medications for this encounter.     REVIEW OF SYSTEMS: The patient reports that  she is generally well overall. Does endorse regular fatigue. Review of symptoms is otherwise negative except as per HPI.      PHYSICAL EXAM:  Wt Readings from Last 3 Encounters:  01/04/25 85 lb 9.6 oz (38.8 kg)  12/03/24 90 lb 9.6 oz (41.1 kg)  09/24/24 90 lb 6.4 oz (41 kg)   Temp Readings from Last 3 Encounters:  01/04/25 (!) 97.4 F (36.3 C)  12/03/24 98.4 F (36.9 C)  09/24/24 98.3 F (36.8 C)   BP Readings from Last 3 Encounters:  01/04/25 (!) 139/56  12/03/24 (!) 143/54  09/24/24 132/67   Pulse Readings from Last 3 Encounters:  01/04/25 85  12/03/24 68  09/24/24 81    /10   Physical Exam Vitals and nursing note reviewed.  Constitutional:      General: She is not in acute distress. HENT:     Head: Normocephalic and atraumatic.  Eyes:     Extraocular Movements: Extraocular movements intact.  Cardiovascular:     Rate and Rhythm: Normal rate.  Pulmonary:     Effort: Pulmonary effort is normal. No respiratory  distress.  Abdominal:     General: There is no distension.  Musculoskeletal:        General: Swelling and deformity present.     Comments: Significant swelling and deformation of multiple PIP and DIP joints in the R hand; difficulty opening and closing hand due to pain and stiffness  Skin:    Coloration: Skin is not jaundiced or pale.  Neurological:     General: No focal deficit present.     Mental Status: She is alert.     Cranial Nerves: No cranial nerve deficit.  Psychiatric:        Mood and Affect: Mood normal.       LABORATORY DATA:  Lab Results  Component Value Date   WBC 3.5 (L) 08/08/2024   HGB 10.9 (L) 08/08/2024   HCT 30.2 (L) 08/08/2024   MCV 100.3 (H) 08/08/2024   PLT 141 (L) 08/08/2024   Lab Results  Component Value Date   NA 133 (L) 08/08/2024   K 4.6 08/08/2024   CL 101 08/08/2024   CO2 23 08/08/2024   Lab Results  Component Value Date   ALT 57 (H) 08/08/2024   AST 87 (H) 08/08/2024   ALKPHOS 75 08/08/2024   BILITOT 0.6 08/08/2024      RADIOGRAPHY:   X-ray Hand 2 views R 09/24/24:  X-ray right hand 2 views   Radiocarpal joint space appears normal.  Cystic changes present in the carpal bones.  Mild degenerative change of first [facility] joint there is more significant MCP joint subluxation.  Subluxation at the MCP joints, degenerative changes and subchondral cyst extensive at the third metacarpal head.  Severe subluxation and lateral deviation at multiple PIP joints with associated soft tissue swelling particularly notable at the 3rd and 4th fingers.  No definite periarticular erosions seen.  There is significant lateral subluxation at DIP joints as well without prominent lateral bone spurring or periarticular erosions.  There is generalized osteopenia.   Impression   There are degenerative changes more pronounced in proximal distribution but there is a severe degree of joint subluxation and soft tissue swelling relative to the degree of distal bone  spurring and then no visible erosions.  Could potentially be explained by primary osteoarthritis but also be consistent with chronic nonerosive inflammatory disease such as RA or longstanding Jaccoud arthropathy.   X-ray Knee 1-2 views R 01/17/2023:  AP lateral radiographs right knee reviewed.  Moderate arthritis is present  in all 3 compartments.  Bones appear osteopenic.  No acute fracture.   Patella height normal relative to the distal femur.    PATHOLOGY: no pertinent pathology     IMPRESSION/PLAN:   Ms. Dier is a 79 yo F w/ a hx of moderate severe degenerative changes/osteoarthritis of the hands and knees. She is most symptomatic in her bilateral knees and R hand and her sx are not improved by standard conservative measures including OTC medicines, heat, bracing, PT and injections. After a detailed discussion of the patients history, prior treatment response, and current goals, we reviewed the proposed protocol for LDRT targeting the R hand and bilateral knees. Our institutional approach follows a regimen of 3 Gy total, delivered in 6 fractions of 0.5 Gy per fraction over 2-3 weeks. This dosing is consistent with published guidelines and peer-reviewed data for non-malignant musculoskeletal conditions.  We reviewed:   Goals of care: symptom relief and improved mobility Expected timeline for therapeutic benefit: typically several weeks to months Potential risks, including rare but theoretical concerns regarding late tissue effects or secondary malignancy (risk estimated to be extremely low at this dose and age) Lack of immunosuppression and inactive autoimmune history, minimizing concern for immune-mediated complications   My goal with low dose radiation therapy is to help reduce chronic joint pain and improve mobility, particularly for activities like walking and opening jars. This treatment does not reverse joint damage, but it can calm the inflammation in and around the joint  that contributes to pain. Based on published data, about 60-80% of patients with osteoarthritis experience meaningful pain relief within a few weeks to months after completing a short course of low-dose radiation. While not everyone responds, the majority of patients do report improvement, and the treatment is generally well tolerated.  The patient was agreeable to proceed. We will arrange CT simulation for planning and initiate treatment pending final review and setup. A follow-up visit will be scheduled approximately 8-12 weeks after completion of LDRT to assess clinical response.   We personally spent 60 minutes in this encounter including chart review, reviewing radiological studies, meeting face-to-face with the patient, entering orders and completing documentation.    Catherine Harrington, M.D.   "

## 2025-01-04 ENCOUNTER — Ambulatory Visit: Admission: RE | Admit: 2025-01-04

## 2025-01-04 ENCOUNTER — Ambulatory Visit: Admission: RE | Admit: 2025-01-04 | Admitting: Radiation Oncology

## 2025-01-04 ENCOUNTER — Encounter: Payer: Self-pay | Admitting: Radiation Oncology

## 2025-01-04 VITALS — BP 139/56 | HR 85 | Temp 97.4°F | Resp 20 | Ht 61.0 in | Wt 85.6 lb

## 2025-01-04 DIAGNOSIS — M170 Bilateral primary osteoarthritis of knee: Secondary | ICD-10-CM

## 2025-01-04 DIAGNOSIS — M19042 Primary osteoarthritis, left hand: Secondary | ICD-10-CM

## 2025-01-04 NOTE — Progress Notes (Signed)
 Site of osteoarthritis: Right  knee and right hand  How long have you had pain? Approx. 2-3 years.  What over the counter or prescription medications have you tried? has trialed conservative therapies including NSAIDs, and viscosupplementation injections with limited or unclear benefit. She prefers to avoid regular oral medications and is not interested in joint replacement surgery at this time.  Does anything make the pain better or worse? Lifting dishes out of the cabinet makes it worse. Heat and Tylenol  makes it better.      Ambulatory status? Walker? Wheelchair?: Ambulatory with Assistance: Cane  SAFETY ISSUES: Prior radiation? No Pacemaker/ICD? No Possible current pregnancy? No Is the patient on methotrexate? No  Current Complaints / other details:    BP (!) 139/56 (BP Location: Left Arm, Patient Position: Sitting, Cuff Size: Normal)   Pulse 85   Temp (!) 97.4 F (36.3 C)   Resp 20   Ht 5' 1 (1.549 m)   Wt 85 lb 9.6 oz (38.8 kg)   SpO2 98%   BMI 16.17 kg/m

## 2025-01-04 NOTE — Addendum Note (Signed)
 Encounter addended by: Maritza Stagger, MD on: 01/04/2025 5:13 PM  Actions taken: Visit diagnoses modified, Clinical Note Signed

## 2025-01-14 ENCOUNTER — Ambulatory Visit: Admitting: Radiation Oncology

## 2025-02-06 ENCOUNTER — Other Ambulatory Visit

## 2025-02-06 ENCOUNTER — Ambulatory Visit: Admitting: Hematology

## 2025-02-12 ENCOUNTER — Ambulatory Visit: Admitting: Podiatry

## 2025-06-03 ENCOUNTER — Ambulatory Visit: Admitting: Internal Medicine
